# Patient Record
Sex: Female | Born: 1945
Health system: Southern US, Community
[De-identification: ages and names within clinical notes are randomized; demographics above are authoritative.]

## PROBLEM LIST (undated history)

## (undated) DIAGNOSIS — I10 Essential (primary) hypertension: Secondary | ICD-10-CM

## (undated) DIAGNOSIS — E7800 Pure hypercholesterolemia, unspecified: Secondary | ICD-10-CM

## (undated) DIAGNOSIS — J45909 Unspecified asthma, uncomplicated: Secondary | ICD-10-CM

## (undated) DIAGNOSIS — C801 Malignant (primary) neoplasm, unspecified: Secondary | ICD-10-CM

## (undated) DIAGNOSIS — K219 Gastro-esophageal reflux disease without esophagitis: Secondary | ICD-10-CM

## (undated) HISTORY — DX: Malignant (primary) neoplasm, unspecified: C80.1

---

## 2016-05-19 DIAGNOSIS — I10 Essential (primary) hypertension: Secondary | ICD-10-CM | POA: Diagnosis present

## 2016-05-19 DIAGNOSIS — E782 Mixed hyperlipidemia: Secondary | ICD-10-CM | POA: Diagnosis present

## 2018-10-05 DIAGNOSIS — B0229 Other postherpetic nervous system involvement: Secondary | ICD-10-CM | POA: Diagnosis present

## 2019-01-09 DIAGNOSIS — M3390 Dermatopolymyositis, unspecified, organ involvement unspecified: Secondary | ICD-10-CM | POA: Diagnosis present

## 2019-01-09 DIAGNOSIS — G8929 Other chronic pain: Secondary | ICD-10-CM | POA: Insufficient documentation

## 2019-01-09 DIAGNOSIS — M3313 Other dermatomyositis without myopathy: Secondary | ICD-10-CM | POA: Diagnosis present

## 2019-01-13 DIAGNOSIS — D849 Immunodeficiency, unspecified: Secondary | ICD-10-CM | POA: Insufficient documentation

## 2019-07-11 DIAGNOSIS — M51369 Other intervertebral disc degeneration, lumbar region without mention of lumbar back pain or lower extremity pain: Secondary | ICD-10-CM

## 2019-07-11 DIAGNOSIS — M5136 Other intervertebral disc degeneration, lumbar region: Secondary | ICD-10-CM

## 2020-05-04 ENCOUNTER — Encounter (HOSPITAL_COMMUNITY): Payer: Self-pay

## 2020-05-04 ENCOUNTER — Other Ambulatory Visit: Payer: Self-pay

## 2020-05-04 ENCOUNTER — Inpatient Hospital Stay (HOSPITAL_COMMUNITY)
Admit: 2020-05-04 | Discharge: 2020-05-08 | DRG: 758 | Disposition: A | Discharge status: Home Health | Payer: Medicare PPO | Attending: Internal Medicine | Admitting: Internal Medicine

## 2020-05-04 ENCOUNTER — Emergency Department (HOSPITAL_COMMUNITY): Payer: Medicare PPO

## 2020-05-04 DIAGNOSIS — K760 Fatty (change of) liver, not elsewhere classified: Secondary | ICD-10-CM | POA: Diagnosis present

## 2020-05-04 DIAGNOSIS — Z79899 Other long term (current) drug therapy: Secondary | ICD-10-CM

## 2020-05-04 DIAGNOSIS — Z791 Long term (current) use of non-steroidal anti-inflammatories (NSAID): Secondary | ICD-10-CM

## 2020-05-04 DIAGNOSIS — K219 Gastro-esophageal reflux disease without esophagitis: Secondary | ICD-10-CM | POA: Diagnosis present

## 2020-05-04 DIAGNOSIS — G8929 Other chronic pain: Secondary | ICD-10-CM | POA: Diagnosis present

## 2020-05-04 DIAGNOSIS — E782 Mixed hyperlipidemia: Secondary | ICD-10-CM | POA: Diagnosis present

## 2020-05-04 DIAGNOSIS — M5441 Lumbago with sciatica, right side: Secondary | ICD-10-CM | POA: Diagnosis present

## 2020-05-04 DIAGNOSIS — I890 Lymphedema, not elsewhere classified: Secondary | ICD-10-CM | POA: Diagnosis present

## 2020-05-04 DIAGNOSIS — E872 Acidosis, unspecified: Secondary | ICD-10-CM | POA: Diagnosis present

## 2020-05-04 DIAGNOSIS — Z888 Allergy status to other drugs, medicaments and biological substances status: Secondary | ICD-10-CM

## 2020-05-04 DIAGNOSIS — I110 Hypertensive heart disease with heart failure: Secondary | ICD-10-CM | POA: Diagnosis present

## 2020-05-04 DIAGNOSIS — E8809 Other disorders of plasma-protein metabolism, not elsewhere classified: Secondary | ICD-10-CM | POA: Diagnosis present

## 2020-05-04 DIAGNOSIS — J45909 Unspecified asthma, uncomplicated: Secondary | ICD-10-CM | POA: Diagnosis present

## 2020-05-04 DIAGNOSIS — M3313 Other dermatomyositis without myopathy: Secondary | ICD-10-CM | POA: Diagnosis present

## 2020-05-04 DIAGNOSIS — Z7952 Long term (current) use of systemic steroids: Secondary | ICD-10-CM

## 2020-05-04 DIAGNOSIS — E119 Type 2 diabetes mellitus without complications: Secondary | ICD-10-CM | POA: Diagnosis present

## 2020-05-04 DIAGNOSIS — I10 Essential (primary) hypertension: Secondary | ICD-10-CM | POA: Diagnosis present

## 2020-05-04 DIAGNOSIS — N739 Female pelvic inflammatory disease, unspecified: Principal | ICD-10-CM | POA: Diagnosis present

## 2020-05-04 DIAGNOSIS — L8990 Pressure ulcer of unspecified site, unspecified stage: Secondary | ICD-10-CM | POA: Insufficient documentation

## 2020-05-04 DIAGNOSIS — R600 Localized edema: Secondary | ICD-10-CM | POA: Diagnosis present

## 2020-05-04 DIAGNOSIS — R809 Proteinuria, unspecified: Secondary | ICD-10-CM | POA: Diagnosis present

## 2020-05-04 DIAGNOSIS — M3390 Dermatopolymyositis, unspecified, organ involvement unspecified: Secondary | ICD-10-CM | POA: Diagnosis present

## 2020-05-04 DIAGNOSIS — B0229 Other postherpetic nervous system involvement: Secondary | ICD-10-CM | POA: Diagnosis present

## 2020-05-04 DIAGNOSIS — I2510 Atherosclerotic heart disease of native coronary artery without angina pectoris: Secondary | ICD-10-CM | POA: Diagnosis present

## 2020-05-04 DIAGNOSIS — M7989 Other specified soft tissue disorders: Secondary | ICD-10-CM | POA: Diagnosis not present

## 2020-05-04 DIAGNOSIS — R197 Diarrhea, unspecified: Secondary | ICD-10-CM | POA: Diagnosis present

## 2020-05-04 DIAGNOSIS — Z20822 Contact with and (suspected) exposure to covid-19: Secondary | ICD-10-CM | POA: Diagnosis present

## 2020-05-04 DIAGNOSIS — L0291 Cutaneous abscess, unspecified: Secondary | ICD-10-CM

## 2020-05-04 DIAGNOSIS — Z8619 Personal history of other infectious and parasitic diseases: Secondary | ICD-10-CM

## 2020-05-04 HISTORY — DX: Gastro-esophageal reflux disease without esophagitis: K21.9

## 2020-05-04 HISTORY — DX: Pure hypercholesterolemia, unspecified: E78.00

## 2020-05-04 HISTORY — DX: Unspecified asthma, uncomplicated: J45.909

## 2020-05-04 HISTORY — DX: Essential (primary) hypertension: I10

## 2020-05-04 LAB — CBC
HCT: 31.8 % — ABNORMAL LOW (ref 36.0–46.0)
Hemoglobin: 9.9 g/dL — ABNORMAL LOW (ref 12.0–15.0)
MCH: 30.7 pg (ref 26.0–34.0)
MCHC: 31.1 g/dL (ref 30.0–36.0)
MCV: 98.5 fL (ref 80.0–100.0)
Platelets: 144 10*3/uL — ABNORMAL LOW (ref 150–400)
RBC: 3.23 MIL/uL — ABNORMAL LOW (ref 3.87–5.11)
RDW: 15.3 % (ref 11.5–15.5)
WBC: 7.5 10*3/uL (ref 4.0–10.5)
nRBC: 1.6 % — ABNORMAL HIGH (ref 0.0–0.2)

## 2020-05-04 LAB — BASIC METABOLIC PANEL
Anion gap: 8 (ref 5–15)
BUN: 15 mg/dL (ref 8–23)
CO2: 25 mmol/L (ref 22–32)
Calcium: 8.7 mg/dL — ABNORMAL LOW (ref 8.9–10.3)
Chloride: 103 mmol/L (ref 98–111)
Creatinine, Ser: 1.08 mg/dL — ABNORMAL HIGH (ref 0.44–1.00)
GFR calc Af Amer: 59 mL/min — ABNORMAL LOW (ref >60)
GFR calc non Af Amer: 51 mL/min — ABNORMAL LOW (ref >60)
Glucose, Bld: 85 mg/dL (ref 70–99)
Potassium: 4.3 mmol/L (ref 3.5–5.1)
Sodium: 136 mmol/L (ref 135–145)

## 2020-05-04 NOTE — ED Notes (Signed)
Lab to add on BNP.  °

## 2020-05-04 NOTE — ED Notes (Signed)
EDP at bedside  

## 2020-05-04 NOTE — ED Provider Notes (Signed)
[facility] EMERGENCY DEPARTMENT Provider Note   CSN: [id number] Arrival date & time: 05/04/20  1807     History Chief Complaint  Patient presents with  . Leg Swelling    Haley Johnson is a 74 y.o. female.   Haley Johnson 74 y.o. female  with past medical history significant for essential hypertension, DDD of the spine, chronic low back pain, DM2, dermatomyositis, vasculitis, on chronic immunosuppressive therapy, chronic lymphedema presenting with worsening bilateral lower extremity edema for the past 3 days.  States she is had leg swelling in the past but never this severe.  She denies any difficulty breathing, chest pain, shortness of breath, cough or fever.  She denies any history of heart failure.  She denies any abdominal pain, nausea, vomiting.  States she came in today because she has progressively worsening swelling in her legs making it difficult to walk.  Chart review shows patient was recently hospitalized.  She was not forthcoming with this information.  She was hospitalized at [facility] from June 3 to June 7 with low back pain.  She was found to have a pelvic abscess near her rectum on CT scan.  IR drainage was attempted but not successful.  She was discharged on p.o. Bactrim and augmentin which she is currently taking.  She is not sure exactly what was done for her in the hospital.  IR drainage is not successful.  She denies any history of diabetes. She also had an MRI of her thoracic and lumbar spine was negative for osteomyelitis or epidural abscess.  The history is provided by the patient.       Past Medical History:  Diagnosis Date  . Asthma   . GERD (gastroesophageal reflux disease)   . Hypercholesteremia   . Hypertension     There are no problems to display for this patient.   History reviewed. No pertinent surgical history.   OB History   No obstetric history on file.     No family history on file.  Social History    Tobacco Use  . Smoking status: Not on file  Substance Use Topics  . Alcohol use: Not on file  . Drug use: Not on file    Home Medications Prior to Admission medications   Not on File    Allergies    Patient has no allergy information on record.  Review of Systems   Review of Systems  Constitutional: Positive for fatigue. Negative for fever.  HENT: Negative for congestion and dental problem.   Eyes: Negative for visual disturbance.  Respiratory: Negative for cough, chest tightness and shortness of breath.   Cardiovascular: Positive for leg swelling. Negative for chest pain.  Gastrointestinal: Negative for abdominal pain, nausea and vomiting.  Genitourinary: Negative for dysuria and hematuria.  Musculoskeletal: Positive for arthralgias and myalgias.  Skin: Negative for rash.  Neurological: Positive for weakness. Negative for light-headedness and headaches.   all other systems are negative except as noted in the HPI and PMH.    Physical Exam Updated Vital Signs BP (!) 157/82 (BP Location: Left Arm)   Pulse 84   Temp 99.2 F (37.3 C) (Oral)   Resp 20   Ht 5\' 2"  (1.575 m)   Wt 77.6 kg   SpO2 100%   BMI 31.28 kg/m   Physical Exam Vitals and nursing note reviewed.  Constitutional:      General: She is not in acute distress.    Appearance: She is well-developed.  HENT:     Head: Normocephalic and atraumatic.     Comments: Hyperpigmentation of face with chronic swelling of her eyes and cheeks which she says is improved from previous    Mouth/Throat:     Pharynx: No oropharyngeal exudate.  Eyes:     Conjunctiva/sclera: Conjunctivae normal.     Pupils: Pupils are equal, round, and reactive to light.  Neck:     Comments: No meningismus. Cardiovascular:     Rate and Rhythm: Normal rate and regular rhythm.     Heart sounds: Normal heart sounds. No murmur heard.   Pulmonary:     Effort: Pulmonary effort is normal. No respiratory distress.     Breath sounds: Normal  breath sounds.  Abdominal:     Palpations: Abdomen is soft.     Tenderness: There is no abdominal tenderness. There is no guarding or rebound.  Musculoskeletal:        General: No tenderness. Normal range of motion.     Cervical back: Normal range of motion and neck supple.     Right lower leg: Edema present.     Left lower leg: Edema present.     Comments: Pitting edema to mid thighs bilaterally.  Intact DP pulses with Doppler.  Skin:    General: Skin is warm.  Neurological:     Mental Status: She is alert and oriented to person, place, and time.     Cranial Nerves: No cranial nerve deficit.     Motor: No abnormal muscle tone.     Coordination: Coordination normal.     Comments:  5/5 strength throughout. CN 2-12 intact.Equal grip strength.   Psychiatric:        Behavior: Behavior normal.     ED Results / Procedures / Treatments   Labs (all labs ordered are listed, but only abnormal results are displayed) Labs Reviewed  CBC - Abnormal; Notable for the following components:      Result Value   RBC 3.23 (*)    Hemoglobin 9.9 (*)    HCT 31.8 (*)    Platelets 144 (*)    nRBC 1.6 (*)    All other components within normal limits  BASIC METABOLIC PANEL - Abnormal; Notable for the following components:   Creatinine, Ser 1.08 (*)    Calcium 8.7 (*)    GFR calc non Af Amer 51 (*)    GFR calc Af Amer 59 (*)    All other components within normal limits  SEDIMENTATION RATE - Abnormal; Notable for the following components:   Sed Rate 23 (*)    All other components within normal limits  C-REACTIVE PROTEIN - Abnormal; Notable for the following components:   CRP 4.0 (*)    All other components within normal limits  HEPATIC FUNCTION PANEL - Abnormal; Notable for the following components:   Total Protein 6.1 (*)    Albumin 2.6 (*)    AST 78 (*)    Alkaline Phosphatase 36 (*)    All other components within normal limits  LACTIC ACID, PLASMA - Abnormal; Notable for the following  components:   Lactic Acid, Venous 3.1 (*)    All other components within normal limits  HEMOGLOBIN A1C - Abnormal; Notable for the following components:   Hgb A1c MFr Bld 6.2 (*)    All other components within normal limits  CULTURE, BLOOD (ROUTINE X 2)  CULTURE, BLOOD (ROUTINE X 2)  SARS CORONAVIRUS 2 BY RT PCR (HOSPITAL ORDER, [facility])  BRAIN NATRIURETIC PEPTIDE  TSH  T4, FREE  LIPASE, BLOOD  PROTIME-INR  APTT  LACTIC ACID, PLASMA  URINALYSIS, COMPLETE (UACMP) WITH MICROSCOPIC  PROTEIN / CREATININE RATIO, URINE    EKG None  Radiology CT Chest W Contrast  Result Date: 05/05/2020 CLINICAL DATA:  Bilateral lower extremity edema. EXAM: CT CHEST, ABDOMEN, AND PELVIS WITH CONTRAST TECHNIQUE: Multidetector CT imaging of the chest, abdomen and pelvis was performed following the standard protocol during bolus administration of intravenous contrast. CONTRAST:  177mL OMNIPAQUE IOHEXOL 300 MG/ML  SOLN COMPARISON:  None. FINDINGS: CT CHEST FINDINGS Cardiovascular: Scattered coronary artery calcifications. Heart is normal size. Aorta is normal caliber. Retroesophageal right subclavian artery. Mediastinum/Nodes: No mediastinal, hilar, or axillary adenopathy. Trachea and esophagus are unremarkable. Lungs/Pleura: Lungs are clear. No focal airspace opacities or suspicious nodules. No effusions. Musculoskeletal: Chest wall soft tissues are unremarkable. No acute bony abnormality. CT ABDOMEN PELVIS FINDINGS Hepatobiliary: Diffuse fatty infiltration of the liver. No suspicious focal hepatic abnormality. Small cyst in the right hepatic lobe. Gallbladder unremarkable. Pancreas: No focal abnormality or ductal dilatation. Spleen: No focal abnormality.  Normal size. Adrenals/Urinary Tract: No adrenal abnormality. No focal renal abnormality. No stones or hydronephrosis. Urinary bladder is unremarkable. Stomach/Bowel: Stomach, large and small bowel grossly unremarkable.  Vascular/Lymphatic: Aortic atherosclerosis. No enlarged abdominal or pelvic lymph nodes. Reproductive: Uterus is unremarkable. There is a 3.1 cm fluid collection seen in the cul-de-sac posterior to the uterus with enhancing rim. It is unclear if this is ovarian in origin or could represent a focal pelvic abscess. Other: Small amount of free fluid in the pelvis.  No free air. Musculoskeletal: No acute bony abnormality. IMPRESSION: Scattered coronary artery calcifications. Diffuse fatty infiltration of the liver. 3.1 cm fluid collection in the cul-de-sac between the uterus and sigmoid colon. This could be ovarian in origin or could reflect a pelvic abscess. Consider further evaluation with pelvic ultrasound. Electronically Signed   By: Rolm Baptise M.D.   On: 05/05/2020 01:24   US Pelvis Complete  Result Date: 05/05/2020 CLINICAL DATA:  Pelvic fluid collection. EXAM: TRANSABDOMINAL ULTRASOUND OF PELVIS TECHNIQUE: Transabdominal ultrasound examination of the pelvis was performed including evaluation of the uterus, ovaries, adnexal regions, and pelvic cul-de-sac. COMPARISON:  CT from same day. FINDINGS: Uterus Measurements: 8 x 3.4 x 4.5 cm = volume: 63 mL. No fibroids or other mass visualized. Endometrium: The endometrium was poorly evaluated. Right ovary Not visualized Left ovary Not visualized Other findings:  No abnormal free fluid. IMPRESSION: 1. Very limited study. 2. Neither ovary was visualized. 3. The collection in the patient's pelvis identified on the prior CT was not visualized on this study. 4. Suboptimal evaluation of the endometrium on this transabdominal only examination. Electronically Signed   By: Constance Holster M.D.   On: 05/05/2020 02:56   CT ABDOMEN PELVIS W CONTRAST  Result Date: 05/05/2020 CLINICAL DATA:  Bilateral lower extremity edema. EXAM: CT CHEST, ABDOMEN, AND PELVIS WITH CONTRAST TECHNIQUE: Multidetector CT imaging of the chest, abdomen and pelvis was performed following the  standard protocol during bolus administration of intravenous contrast. CONTRAST:  140mL OMNIPAQUE IOHEXOL 300 MG/ML  SOLN COMPARISON:  None. FINDINGS: CT CHEST FINDINGS Cardiovascular: Scattered coronary artery calcifications. Heart is normal size. Aorta is normal caliber. Retroesophageal right subclavian artery. Mediastinum/Nodes: No mediastinal, hilar, or axillary adenopathy. Trachea and esophagus are unremarkable. Lungs/Pleura: Lungs are clear. No focal airspace opacities or suspicious nodules. No effusions. Musculoskeletal: Chest wall soft tissues are unremarkable. No acute bony abnormality. CT  ABDOMEN PELVIS FINDINGS Hepatobiliary: Diffuse fatty infiltration of the liver. No suspicious focal hepatic abnormality. Small cyst in the right hepatic lobe. Gallbladder unremarkable. Pancreas: No focal abnormality or ductal dilatation. Spleen: No focal abnormality.  Normal size. Adrenals/Urinary Tract: No adrenal abnormality. No focal renal abnormality. No stones or hydronephrosis. Urinary bladder is unremarkable. Stomach/Bowel: Stomach, large and small bowel grossly unremarkable. Vascular/Lymphatic: Aortic atherosclerosis. No enlarged abdominal or pelvic lymph nodes. Reproductive: Uterus is unremarkable. There is a 3.1 cm fluid collection seen in the cul-de-sac posterior to the uterus with enhancing rim. It is unclear if this is ovarian in origin or could represent a focal pelvic abscess. Other: Small amount of free fluid in the pelvis.  No free air. Musculoskeletal: No acute bony abnormality. IMPRESSION: Scattered coronary artery calcifications. Diffuse fatty infiltration of the liver. 3.1 cm fluid collection in the cul-de-sac between the uterus and sigmoid colon. This could be ovarian in origin or could reflect a pelvic abscess. Consider further evaluation with pelvic ultrasound. Electronically Signed   By: Rolm Baptise M.D.   On: 05/05/2020 01:24   DG Chest Portable 1 View  Result Date: 05/05/2020 CLINICAL DATA:   Shortness of breath. EXAM: PORTABLE CHEST 1 VIEW COMPARISON:  None. FINDINGS: The heart size is borderline enlarged. There is no pneumothorax or large pleural effusion. No focal infiltrate. There is a small density in the left perihilar region measuring approximately 7 mm and projecting over the posterior seventh rib on the left. IMPRESSION: 1. No acute cardiopulmonary process. 2. Small 7 mm density in the left perihilar region. A 4-6 week follow-up two-view chest x-ray is recommended to confirm resolution of this finding. Electronically Signed   By: Constance Holster M.D.   On: 05/05/2020 00:15    Procedures Procedures (including critical care time)  Medications Ordered in ED Medications - No data to display  ED Course  I have reviewed the triage vital signs and the nursing notes.  Pertinent labs & imaging results that were available during my care of the patient were reviewed by me and considered in my medical decision making (see chart for details).    MDM Rules/Calculators/A&P                         Worsening chronic leg swelling over the past several days.  Recent hospitalization for pelvic abscess currently on antibiotics with unsuccessful IR drainage.  Here with increased leg swelling but denies any shortness of breath, cough or fever.  Hemoglobin of 9.9 is stable to previous values in care everywhere. Creatinine is stable.  CT today shows recurrent abscess between the ovary and sigmoid colon. Ultrasound was performed but did not identify this fluid collection.  Patient was discharged on Augmentin which seems not first-line choice for intra-abdominal abscess.  Will initiate IV antibiotics.  Her leg swelling is worsened than baseline to the point where she cannot ambulate.  This may be due to her low albumin versus worsening of her chronic edema. CXR without significant edema but does have elevated JVD.  Lactic acid mildly elevated without other signs of sepsis or systemic  infection.  D/w hospitalist Dr. Cyd Silence who agrees with IV anitbiotics and trial of lasix despite elevated lactate. Final Clinical Impression(s) / ED Diagnoses Final diagnoses:  Leg edema  Pelvic abscess in female    Rx / DC Orders ED Discharge Orders    None       Jhania Etherington, Annie Main, MD 05/05/20 [phone]

## 2020-05-04 NOTE — ED Triage Notes (Signed)
Pt arrives to ED w/ c/o BLE edema x 1 week. Pt denies SOB, denies CHF hx.

## 2020-05-05 ENCOUNTER — Observation Stay (HOSPITAL_BASED_OUTPATIENT_CLINIC_OR_DEPARTMENT_OTHER): Payer: Medicare PPO

## 2020-05-05 ENCOUNTER — Emergency Department (HOSPITAL_COMMUNITY): Payer: Medicare PPO

## 2020-05-05 ENCOUNTER — Other Ambulatory Visit: Payer: Self-pay

## 2020-05-05 ENCOUNTER — Encounter (HOSPITAL_COMMUNITY): Payer: Self-pay | Admitting: Internal Medicine

## 2020-05-05 DIAGNOSIS — R197 Diarrhea, unspecified: Secondary | ICD-10-CM | POA: Diagnosis present

## 2020-05-05 DIAGNOSIS — K219 Gastro-esophageal reflux disease without esophagitis: Secondary | ICD-10-CM | POA: Diagnosis present

## 2020-05-05 DIAGNOSIS — M3313 Other dermatomyositis without myopathy: Secondary | ICD-10-CM | POA: Diagnosis present

## 2020-05-05 DIAGNOSIS — M5441 Lumbago with sciatica, right side: Secondary | ICD-10-CM | POA: Diagnosis present

## 2020-05-05 DIAGNOSIS — B0229 Other postherpetic nervous system involvement: Secondary | ICD-10-CM | POA: Diagnosis present

## 2020-05-05 DIAGNOSIS — K760 Fatty (change of) liver, not elsewhere classified: Secondary | ICD-10-CM | POA: Diagnosis present

## 2020-05-05 DIAGNOSIS — J45909 Unspecified asthma, uncomplicated: Secondary | ICD-10-CM | POA: Diagnosis present

## 2020-05-05 DIAGNOSIS — E119 Type 2 diabetes mellitus without complications: Secondary | ICD-10-CM | POA: Diagnosis present

## 2020-05-05 DIAGNOSIS — N739 Female pelvic inflammatory disease, unspecified: Secondary | ICD-10-CM | POA: Diagnosis present

## 2020-05-05 DIAGNOSIS — Z791 Long term (current) use of non-steroidal anti-inflammatories (NSAID): Secondary | ICD-10-CM | POA: Diagnosis not present

## 2020-05-05 DIAGNOSIS — R609 Edema, unspecified: Secondary | ICD-10-CM

## 2020-05-05 DIAGNOSIS — I2510 Atherosclerotic heart disease of native coronary artery without angina pectoris: Secondary | ICD-10-CM | POA: Diagnosis present

## 2020-05-05 DIAGNOSIS — E46 Unspecified protein-calorie malnutrition: Secondary | ICD-10-CM | POA: Diagnosis present

## 2020-05-05 DIAGNOSIS — Z79899 Other long term (current) drug therapy: Secondary | ICD-10-CM | POA: Diagnosis not present

## 2020-05-05 DIAGNOSIS — E872 Acidosis, unspecified: Secondary | ICD-10-CM | POA: Diagnosis present

## 2020-05-05 DIAGNOSIS — Z888 Allergy status to other drugs, medicaments and biological substances status: Secondary | ICD-10-CM | POA: Diagnosis not present

## 2020-05-05 DIAGNOSIS — Z20822 Contact with and (suspected) exposure to covid-19: Secondary | ICD-10-CM | POA: Diagnosis present

## 2020-05-05 DIAGNOSIS — I890 Lymphedema, not elsewhere classified: Secondary | ICD-10-CM | POA: Diagnosis present

## 2020-05-05 DIAGNOSIS — G8929 Other chronic pain: Secondary | ICD-10-CM | POA: Diagnosis present

## 2020-05-05 DIAGNOSIS — E782 Mixed hyperlipidemia: Secondary | ICD-10-CM | POA: Diagnosis present

## 2020-05-05 DIAGNOSIS — M7989 Other specified soft tissue disorders: Secondary | ICD-10-CM | POA: Diagnosis present

## 2020-05-05 DIAGNOSIS — I10 Essential (primary) hypertension: Secondary | ICD-10-CM

## 2020-05-05 DIAGNOSIS — I5023 Acute on chronic systolic (congestive) heart failure: Secondary | ICD-10-CM | POA: Diagnosis not present

## 2020-05-05 DIAGNOSIS — I110 Hypertensive heart disease with heart failure: Secondary | ICD-10-CM | POA: Diagnosis present

## 2020-05-05 DIAGNOSIS — R809 Proteinuria, unspecified: Secondary | ICD-10-CM | POA: Diagnosis present

## 2020-05-05 DIAGNOSIS — Z8619 Personal history of other infectious and parasitic diseases: Secondary | ICD-10-CM | POA: Diagnosis not present

## 2020-05-05 DIAGNOSIS — M3390 Dermatopolymyositis, unspecified, organ involvement unspecified: Secondary | ICD-10-CM

## 2020-05-05 DIAGNOSIS — R600 Localized edema: Secondary | ICD-10-CM | POA: Diagnosis present

## 2020-05-05 DIAGNOSIS — E8809 Other disorders of plasma-protein metabolism, not elsewhere classified: Secondary | ICD-10-CM | POA: Diagnosis present

## 2020-05-05 DIAGNOSIS — Z7952 Long term (current) use of systemic steroids: Secondary | ICD-10-CM | POA: Diagnosis not present

## 2020-05-05 HISTORY — DX: Dermatopolymyositis, unspecified, organ involvement unspecified: M33.90

## 2020-05-05 HISTORY — DX: Gastro-esophageal reflux disease without esophagitis: K21.9

## 2020-05-05 HISTORY — DX: Essential (primary) hypertension: I10

## 2020-05-05 HISTORY — DX: Other dermatomyositis without myopathy: M33.13

## 2020-05-05 HISTORY — DX: Female pelvic inflammatory disease, unspecified: N73.9

## 2020-05-05 LAB — HEPATIC FUNCTION PANEL
ALT: 43 U/L (ref 0–44)
AST: 78 U/L — ABNORMAL HIGH (ref 15–41)
Albumin: 2.6 g/dL — ABNORMAL LOW (ref 3.5–5.0)
Alkaline Phosphatase: 36 U/L — ABNORMAL LOW (ref 38–126)
Bilirubin, Direct: 0.2 mg/dL (ref 0.0–0.2)
Indirect Bilirubin: 0.7 mg/dL (ref 0.3–0.9)
Total Bilirubin: 0.9 mg/dL (ref 0.3–1.2)
Total Protein: 6.1 g/dL — ABNORMAL LOW (ref 6.5–8.1)

## 2020-05-05 LAB — PROTIME-INR
INR: 1.2 (ref 0.8–1.2)
Prothrombin Time: 14.7 seconds (ref 11.4–15.2)

## 2020-05-05 LAB — HEMOGLOBIN A1C
Hgb A1c MFr Bld: 6.2 % — ABNORMAL HIGH (ref 4.8–5.6)
Mean Plasma Glucose: 131.24 mg/dL

## 2020-05-05 LAB — LIPASE, BLOOD: Lipase: 20 U/L (ref 11–51)

## 2020-05-05 LAB — C-REACTIVE PROTEIN: CRP: 4 mg/dL — ABNORMAL HIGH (ref <1.0)

## 2020-05-05 LAB — SEDIMENTATION RATE: Sed Rate: 23 mm/hr — ABNORMAL HIGH (ref 0–22)

## 2020-05-05 LAB — APTT: aPTT: 35 seconds (ref 24–36)

## 2020-05-05 LAB — LACTIC ACID, PLASMA
Lactic Acid, Venous: 3.1 mmol/L (ref 0.5–1.9)
Lactic Acid, Venous: 1.9 mmol/L (ref 0.5–1.9)

## 2020-05-05 LAB — SARS CORONAVIRUS 2 BY RT PCR (HOSPITAL ORDER, PERFORMED IN ~~LOC~~ HOSPITAL LAB): SARS Coronavirus 2: NEGATIVE

## 2020-05-05 LAB — T4, FREE: Free T4: 1.06 ng/dL (ref 0.61–1.12)

## 2020-05-05 LAB — BRAIN NATRIURETIC PEPTIDE: B Natriuretic Peptide: 48.2 pg/mL (ref 0.0–100.0)

## 2020-05-05 LAB — TSH: TSH: 0.556 u[IU]/mL (ref 0.350–4.500)

## 2020-05-05 MED ORDER — FAMOTIDINE 20 MG PO TABS
20.0000 mg | ORAL_TABLET | Freq: Every day | ORAL | Status: DC
  Administered 2020-05-06 – 2020-05-08 (×3): 20 mg via ORAL
  Filled 2020-05-05 (×3): qty 1

## 2020-05-05 MED ORDER — METRONIDAZOLE IN NACL 5-0.79 MG/ML-% IV SOLN
500.0000 mg | Freq: Three times a day (TID) | INTRAVENOUS | Status: AC
  Administered 2020-05-05 – 2020-05-07 (×9): 500 mg via INTRAVENOUS
  Filled 2020-05-05 (×9): qty 100

## 2020-05-05 MED ORDER — FUROSEMIDE 10 MG/ML IJ SOLN
20.0000 mg | Freq: Once | INTRAMUSCULAR | Status: AC
  Administered 2020-05-05: 20 mg via INTRAVENOUS
  Filled 2020-05-05: qty 2

## 2020-05-05 MED ORDER — ALBUTEROL SULFATE (2.5 MG/3ML) 0.083% IN NEBU: 2.5000 mg | INHALATION_SOLUTION | RESPIRATORY_TRACT | Status: DC | PRN

## 2020-05-05 MED ORDER — ENSURE ENLIVE PO LIQD
237.0000 mL | Freq: Two times a day (BID) | ORAL | Status: DC
  Administered 2020-05-05 – 2020-05-06 (×3): 237 mL via ORAL
  Filled 2020-05-05 (×3): qty 237

## 2020-05-05 MED ORDER — ACETAMINOPHEN 325 MG PO TABS
650.0000 mg | ORAL_TABLET | Freq: Four times a day (QID) | ORAL | Status: DC | PRN
  Administered 2020-05-06: 650 mg via ORAL
  Filled 2020-05-05: qty 2

## 2020-05-05 MED ORDER — IOHEXOL 300 MG/ML  SOLN
100.0000 mL | Freq: Once | INTRAMUSCULAR | Status: AC | PRN
  Administered 2020-05-05: 100 mL via INTRAVENOUS

## 2020-05-05 MED ORDER — FAMOTIDINE 20 MG PO TABS
40.0000 mg | ORAL_TABLET | Freq: Every day | ORAL | Status: DC
  Administered 2020-05-05: 40 mg via ORAL
  Filled 2020-05-05: qty 2

## 2020-05-05 MED ORDER — GABAPENTIN 100 MG PO CAPS
200.0000 mg | ORAL_CAPSULE | Freq: Every day | ORAL | Status: DC
  Administered 2020-05-05 – 2020-05-06 (×2): 200 mg via ORAL
  Filled 2020-05-05 (×2): qty 2

## 2020-05-05 MED ORDER — ROSUVASTATIN CALCIUM 20 MG PO TABS
40.0000 mg | ORAL_TABLET | Freq: Every day | ORAL | Status: DC
  Administered 2020-05-05 – 2020-05-08 (×4): 40 mg via ORAL
  Filled 2020-05-05 (×4): qty 2

## 2020-05-05 MED ORDER — SODIUM CHLORIDE 0.9 % IV BOLUS: 1000.0000 mL | Freq: Once | INTRAVENOUS | Status: DC

## 2020-05-05 MED ORDER — POLYETHYLENE GLYCOL 3350 17 G PO PACK: 17.0000 g | PACK | Freq: Every day | ORAL | Status: DC | PRN

## 2020-05-05 MED ORDER — ONDANSETRON HCL 4 MG/2ML IJ SOLN: 4.0000 mg | Freq: Four times a day (QID) | INTRAMUSCULAR | Status: DC | PRN

## 2020-05-05 MED ORDER — ENOXAPARIN SODIUM 40 MG/0.4ML ~~LOC~~ SOLN
40.0000 mg | SUBCUTANEOUS | Status: DC
  Administered 2020-05-05 – 2020-05-08 (×4): 40 mg via SUBCUTANEOUS
  Filled 2020-05-05 (×5): qty 0.4

## 2020-05-05 MED ORDER — PREDNISONE 10 MG PO TABS
10.0000 mg | ORAL_TABLET | Freq: Every day | ORAL | Status: DC
  Administered 2020-05-05 – 2020-05-08 (×4): 10 mg via ORAL
  Filled 2020-05-05 (×4): qty 1

## 2020-05-05 MED ORDER — MYCOPHENOLATE MOFETIL 250 MG PO CAPS
1000.0000 mg | ORAL_CAPSULE | Freq: Two times a day (BID) | ORAL | Status: DC
  Administered 2020-05-05 – 2020-05-08 (×6): 1000 mg via ORAL
  Filled 2020-05-05 (×9): qty 4

## 2020-05-05 MED ORDER — OXYCODONE-ACETAMINOPHEN 5-325 MG PO TABS
1.0000 | ORAL_TABLET | ORAL | Status: DC | PRN
  Administered 2020-05-05 – 2020-05-07 (×7): 1 via ORAL
  Filled 2020-05-05 (×7): qty 1

## 2020-05-05 MED ORDER — ONDANSETRON HCL 4 MG PO TABS: 4.0000 mg | ORAL_TABLET | Freq: Four times a day (QID) | ORAL | Status: DC | PRN

## 2020-05-05 MED ORDER — AMLODIPINE BESYLATE 5 MG PO TABS
5.0000 mg | ORAL_TABLET | Freq: Every day | ORAL | Status: DC
  Administered 2020-05-06 – 2020-05-08 (×3): 5 mg via ORAL
  Filled 2020-05-05 (×3): qty 1

## 2020-05-05 MED ORDER — SODIUM CHLORIDE 0.9 % IV SOLN
2.0000 g | Freq: Two times a day (BID) | INTRAVENOUS | Status: AC
  Administered 2020-05-05 – 2020-05-07 (×6): 2 g via INTRAVENOUS
  Filled 2020-05-05 (×7): qty 2

## 2020-05-05 MED ORDER — ACETAMINOPHEN 650 MG RE SUPP: 650.0000 mg | Freq: Four times a day (QID) | RECTAL | Status: DC | PRN

## 2020-05-05 MED ORDER — BISOPROLOL-HYDROCHLOROTHIAZIDE 2.5-6.25 MG PO TABS
1.0000 | ORAL_TABLET | Freq: Every day | ORAL | Status: DC
  Administered 2020-05-05 – 2020-05-08 (×4): 1 via ORAL
  Filled 2020-05-05 (×4): qty 1

## 2020-05-05 MED ORDER — ALBUTEROL SULFATE HFA 108 (90 BASE) MCG/ACT IN AERS
1.0000 | INHALATION_SPRAY | RESPIRATORY_TRACT | Status: DC | PRN
  Filled 2020-05-05: qty 6.7

## 2020-05-05 NOTE — Progress Notes (Signed)
Pharmacy Antibiotic Note  Haley Johnson is a 74 y.o. female admitted on 05/04/2020 with pelvic abscess.  Pharmacy has been consulted for cefepime dosing.  Plan: Cefepime 2gm IV q12 hours F/u renal function, cultures and clinical course  Height: 5\' 2"  (157.5 cm) Weight: 77.6 kg (171 lb) IBW/kg (Calculated) : 50.1  Temp (24hrs), Avg:99.2 F (37.3 C), Min:99.2 F (37.3 C), Max:99.2 F (37.3 C)  Recent Labs  Lab 05/04/20 1825 05/05/20 0045  WBC 7.5  --   CREATININE 1.08*  --   LATICACIDVEN  --  3.1*    Estimated Creatinine Clearance: 45.4 mL/min (A) (by C-G formula based on SCr of 1.08 mg/dL (H)).    No Known Allergies   Thank you for allowing pharmacy to be a part of this patient's care.  Excell Seltzer Poteet 05/05/2020 4:12 AM

## 2020-05-05 NOTE — Progress Notes (Signed)
VASCULAR LAB PRELIMINARY  PRELIMINARY  PRELIMINARY  PRELIMINARY  Bilateral lower extremity venous duplex completed.    Preliminary report:  See CV proc for preliminary results.   Marycatherine Maniscalco, RVT 05/05/2020, 8:49 AM

## 2020-05-05 NOTE — Progress Notes (Signed)
AdmitPatient ID: Haley Johnson, female   DOB: Apr 27, 1946, 74 y.o.   MRN: [id number] This is a no charge progress note as patient was . admitted this am.  H/P reviewed.  74 year old female with past medical history of hypertension, gastroesophageal reflux disease, hyperlipidemia, diabetes mellitus (patient disputes this), dermatomyositis, gastroesophageal reflux disease and lymphedema who presents to [facility] emergency department with complaints of bilateral lower extremity swelling and lower extremity weakness.Patient was additionally found to have a lactic acidosis of 3.  A/p Pelvic abscess Continue iv abx, may consider consult IR for daiange in am. F/u transvaginal pelvic US.-The collection in the patient's pelvis identified on the prior CT was not visualized on this study. B/l LE US-neg. For dvt

## 2020-05-05 NOTE — H&P (Signed)
History and Physical    Haley Johnson TDV:[id number] DOB: [birth year] DOA: 05/04/2020  PCP: Patient, No Pcp Per  Patient coming from: Home   Chief Complaint:  Chief Complaint  Patient presents with  . Leg Swelling     HPI:    74 year old female with past medical history of hypertension, gastroesophageal reflux disease, hyperlipidemia, diabetes mellitus (patient disputes this), dermatomyositis, gastroesophageal reflux disease and lymphedema who presents to [facility] emergency department with complaints of bilateral lower extremity swelling and lower extremity weakness.  Of note, patient was recently discharged from [facility] in [place] after she was found to have a pelvic abscess after presenting with a several month history of progressive low back pain.  Discharged on a home-going regimen of oral Augmentin on 04/28/2020.  Patient explains that shortly after her discharge from the hospital she felt improved.  However, in the days that followed, patient explains that she developed progressively worsening bilateral lower extremity swelling.  The swelling became more and more severe and became associated with weakness of the bilateral lower extremities.  Over the span of time, patient also experienced several bouts of paroxysmal nocturnal dyspnea and developed cough productive of white frothy sputum.  Patient complains of associated worsening of her right-sided sciatica as her swelling worsened.  Upon further questioning, patient denies fevers, sick contacts, nausea, vomiting, changes in appetite.  Patient symptoms continue to worsen until she was brought into [facility] emergency department for evaluation by family members.  Upon evaluation in the emergency department, patient was found to have substantial bilateral lower extremity swelling.  Patient was additionally found to have a lactic acidosis of 3.  The hospitalist group was called to assess the patient  for admission the hospital.   Review of Systems: A 10-system review of systems has been performed and all systems are negative with the exception of what is listed in the HPI.    Past Medical History:  Diagnosis Date  . Asthma   . Dermatomyositis ([place]) 05/05/2020  . Essential hypertension 05/05/2020  . GERD (gastroesophageal reflux disease)   . GERD without esophagitis 05/05/2020  . Hypercholesteremia   . Hypertension     History reviewed. No pertinent surgical history.   reports that she has never smoked. She has never used smokeless tobacco. No history on file for alcohol use and drug use.  No Known Allergies  Family History  Family history unknown: Yes     Prior to Admission medications   Medication Sig Start Date End Date Taking? Authorizing Provider  albuterol (VENTOLIN HFA) 108 (90 Base) MCG/ACT inhaler Inhale 1-2 puffs into the lungs every 6 (six) hours as needed for wheezing or shortness of breath.   Yes [provider]  amLODipine (NORVASC) 5 MG tablet Take 5 mg by mouth daily. 03/19/20  Yes [provider]  amoxicillin-clavulanate (AUGMENTIN) 875-125 MG tablet Take 1 tablet by mouth 2 (two) times daily.  04/29/20  Yes [provider]  bisoprolol-hydrochlorothiazide (ZIAC) 2.5-6.25 MG tablet Take 1 tablet by mouth daily. 03/19/20  Yes [provider]  etodolac (LODINE) 400 MG tablet Take 400 mg by mouth 2 (two) times daily. 04/17/20  Yes [provider]  famotidine (PEPCID) 40 MG tablet Take 40 mg by mouth daily. 03/19/20  Yes [provider]  gabapentin (NEURONTIN) 100 MG capsule Take 200 mg by mouth at bedtime. 01/10/20  Yes [provider]  meloxicam (MOBIC) 15 MG tablet Take 15 mg by mouth daily. 03/12/20  Yes [provider]  predniSONE (DELTASONE) 10 MG tablet Take 10 mg by mouth daily. 04/18/20  Yes [provider]  rosuvastatin (CRESTOR) 40 MG tablet Take 40 mg by mouth daily. 03/19/20  Yes  [provider]    Physical Exam: Vitals:   05/04/20 1816 05/04/20 2047 05/04/20 2341  BP: 122/65 (!) 144/87 (!) 157/82  Pulse: 83 85 84  Resp: 17 18 20   Temp: 99.2 F (37.3 C)    TempSrc: Oral    SpO2: 100% 100% 100%  Weight: 77.6 kg    Height: 5\' 2"  (1.575 m)      Constitutional: Acute alert and oriented x3, no associated distress.  Skin: no rashes, no lesions, good skin turgor noted. Eyes: Pupils are equally reactive to light.  No evidence of scleral icterus or conjunctival pallor.  ENMT: Moist mucous membranes noted.  Posterior pharynx clear of any exudate or lesions.   Neck: normal, supple, no masses, no thyromegaly.  No evidence of jugular venous distension.   Respiratory: clear to auscultation bilaterally, no wheezing, no crackles. Normal respiratory effort. No accessory muscle use.  Cardiovascular: Regular rate and rhythm, no murmurs / rubs / gallops.  Extensive bilateral lower extremity pitting edema that tracks up through the thighs.. 2+ pedal pulses. No carotid bruits.  Chest:   Nontender without crepitus or deformity.   Back:   Nontender without crepitus or deformity. Abdomen: Abdomen is soft and nontender.  No evidence of intra-abdominal masses.  Positive bowel sounds noted in all quadrants.   Musculoskeletal: Significant pain with both passive and active range of motion of the right leg.  No joint deformity upper and lower extremities. Good ROM, no contractures. Normal muscle tone.  Neurologic: CN 2-12 grossly intact. Sensation intact, patient is moving for all 4 extremities spontaneously patient is following all commands.  Patient is responsive to verbal stimuli.   Psychiatric: Patient presents as a normal mood with appropriate affect.  Patient seems to possess insight as to theircurrent situation.     Labs on Admission: I have personally reviewed following labs and imaging studies -   CBC: Recent Labs  Lab 05/04/20 1825  WBC 7.5  HGB 9.9*  HCT 31.8*    MCV 98.5  PLT 962*   Basic Metabolic Panel: Recent Labs  Lab 05/04/20 1825  NA 136  K 4.3  CL 103  CO2 25  GLUCOSE 85  BUN 15  CREATININE 1.08*  CALCIUM 8.7*   GFR: Estimated Creatinine Clearance: 45.4 mL/min (A) (by C-G formula based on SCr of 1.08 mg/dL (H)). Liver Function Tests: Recent Labs  Lab 05/04/20 2346  AST 78*  ALT 43  ALKPHOS 36*  BILITOT 0.9  PROT 6.1*  ALBUMIN 2.6*   Recent Labs  Lab 05/04/20 2346  LIPASE 20   No results for input(s): AMMONIA in the last 168 hours. Coagulation Profile: No results for input(s): INR, PROTIME in the last 168 hours. Cardiac Enzymes: No results for input(s): CKTOTAL, CKMB, CKMBINDEX, TROPONINI in the last 168 hours. BNP (last 3 results) No results for input(s): PROBNP in the last 8760 hours. HbA1C: Recent Labs    05/05/20 0338  HGBA1C 6.2*   CBG: No results for input(s): GLUCAP in the last 168 hours. Lipid Profile: No results for input(s): CHOL, HDL, LDLCALC, TRIG, CHOLHDL, LDLDIRECT in the last 72 hours. Thyroid Function Tests: Recent Labs    05/04/20 2346  TSH 0.556  FREET4 1.06   Anemia Panel: No results for input(s): VITAMINB12, FOLATE, FERRITIN,  TIBC, IRON, RETICCTPCT in the last 72 hours. Urine analysis: No results found for: COLORURINE, APPEARANCEUR, LABSPEC, [place], GLUCOSEU, HGBUR, BILIRUBINUR, KETONESUR, PROTEINUR, UROBILINOGEN, NITRITE, LEUKOCYTESUR  Radiological Exams on Admission - Personally Reviewed: CT Chest W Contrast  Result Date: 05/05/2020 CLINICAL DATA:  Bilateral lower extremity edema. EXAM: CT CHEST, ABDOMEN, AND PELVIS WITH CONTRAST TECHNIQUE: Multidetector CT imaging of the chest, abdomen and pelvis was performed following the standard protocol during bolus administration of intravenous contrast. CONTRAST:  187mL OMNIPAQUE IOHEXOL 300 MG/ML  SOLN COMPARISON:  None. FINDINGS: CT CHEST FINDINGS Cardiovascular: Scattered coronary artery calcifications. Heart is normal size. Aorta is  normal caliber. Retroesophageal right subclavian artery. Mediastinum/Nodes: No mediastinal, hilar, or axillary adenopathy. Trachea and esophagus are unremarkable. Lungs/Pleura: Lungs are clear. No focal airspace opacities or suspicious nodules. No effusions. Musculoskeletal: Chest wall soft tissues are unremarkable. No acute bony abnormality. CT ABDOMEN PELVIS FINDINGS Hepatobiliary: Diffuse fatty infiltration of the liver. No suspicious focal hepatic abnormality. Small cyst in the right hepatic lobe. Gallbladder unremarkable. Pancreas: No focal abnormality or ductal dilatation. Spleen: No focal abnormality.  Normal size. Adrenals/Urinary Tract: No adrenal abnormality. No focal renal abnormality. No stones or hydronephrosis. Urinary bladder is unremarkable. Stomach/Bowel: Stomach, large and small bowel grossly unremarkable. Vascular/Lymphatic: Aortic atherosclerosis. No enlarged abdominal or pelvic lymph nodes. Reproductive: Uterus is unremarkable. There is a 3.1 cm fluid collection seen in the cul-de-sac posterior to the uterus with enhancing rim. It is unclear if this is ovarian in origin or could represent a focal pelvic abscess. Other: Small amount of free fluid in the pelvis.  No free air. Musculoskeletal: No acute bony abnormality. IMPRESSION: Scattered coronary artery calcifications. Diffuse fatty infiltration of the liver. 3.1 cm fluid collection in the cul-de-sac between the uterus and sigmoid colon. This could be ovarian in origin or could reflect a pelvic abscess. Consider further evaluation with pelvic ultrasound. Electronically Signed   By: Rolm Baptise M.D.   On: 05/05/2020 01:24   US Pelvis Complete  Result Date: 05/05/2020 CLINICAL DATA:  Pelvic fluid collection. EXAM: TRANSABDOMINAL ULTRASOUND OF PELVIS TECHNIQUE: Transabdominal ultrasound examination of the pelvis was performed including evaluation of the uterus, ovaries, adnexal regions, and pelvic cul-de-sac. COMPARISON:  CT from same day.  FINDINGS: Uterus Measurements: 8 x 3.4 x 4.5 cm = volume: 63 mL. No fibroids or other mass visualized. Endometrium: The endometrium was poorly evaluated. Right ovary Not visualized Left ovary Not visualized Other findings:  No abnormal free fluid. IMPRESSION: 1. Very limited study. 2. Neither ovary was visualized. 3. The collection in the patient's pelvis identified on the prior CT was not visualized on this study. 4. Suboptimal evaluation of the endometrium on this transabdominal only examination. Electronically Signed   By: Constance Holster M.D.   On: 05/05/2020 02:56   CT ABDOMEN PELVIS W CONTRAST  Result Date: 05/05/2020 CLINICAL DATA:  Bilateral lower extremity edema. EXAM: CT CHEST, ABDOMEN, AND PELVIS WITH CONTRAST TECHNIQUE: Multidetector CT imaging of the chest, abdomen and pelvis was performed following the standard protocol during bolus administration of intravenous contrast. CONTRAST:  13mL OMNIPAQUE IOHEXOL 300 MG/ML  SOLN COMPARISON:  None. FINDINGS: CT CHEST FINDINGS Cardiovascular: Scattered coronary artery calcifications. Heart is normal size. Aorta is normal caliber. Retroesophageal right subclavian artery. Mediastinum/Nodes: No mediastinal, hilar, or axillary adenopathy. Trachea and esophagus are unremarkable. Lungs/Pleura: Lungs are clear. No focal airspace opacities or suspicious nodules. No effusions. Musculoskeletal: Chest wall soft tissues are unremarkable. No acute bony abnormality. CT ABDOMEN PELVIS FINDINGS Hepatobiliary: Diffuse fatty infiltration  of the liver. No suspicious focal hepatic abnormality. Small cyst in the right hepatic lobe. Gallbladder unremarkable. Pancreas: No focal abnormality or ductal dilatation. Spleen: No focal abnormality.  Normal size. Adrenals/Urinary Tract: No adrenal abnormality. No focal renal abnormality. No stones or hydronephrosis. Urinary bladder is unremarkable. Stomach/Bowel: Stomach, large and small bowel grossly unremarkable. Vascular/Lymphatic:  Aortic atherosclerosis. No enlarged abdominal or pelvic lymph nodes. Reproductive: Uterus is unremarkable. There is a 3.1 cm fluid collection seen in the cul-de-sac posterior to the uterus with enhancing rim. It is unclear if this is ovarian in origin or could represent a focal pelvic abscess. Other: Small amount of free fluid in the pelvis.  No free air. Musculoskeletal: No acute bony abnormality. IMPRESSION: Scattered coronary artery calcifications. Diffuse fatty infiltration of the liver. 3.1 cm fluid collection in the cul-de-sac between the uterus and sigmoid colon. This could be ovarian in origin or could reflect a pelvic abscess. Consider further evaluation with pelvic ultrasound. Electronically Signed   By: Rolm Baptise M.D.   On: 05/05/2020 01:24   DG Chest Portable 1 View  Result Date: 05/05/2020 CLINICAL DATA:  Shortness of breath. EXAM: PORTABLE CHEST 1 VIEW COMPARISON:  None. FINDINGS: The heart size is borderline enlarged. There is no pneumothorax or large pleural effusion. No focal infiltrate. There is a small density in the left perihilar region measuring approximately 7 mm and projecting over the posterior seventh rib on the left. IMPRESSION: 1. No acute cardiopulmonary process. 2. Small 7 mm density in the left perihilar region. A 4-6 week follow-up two-view chest x-ray is recommended to confirm resolution of this finding. Electronically Signed   By: Constance Holster M.D.   On: 05/05/2020 00:15     Assessment/Plan Principal Problem:   Pelvic abscess in female  Recent hospitalization at [facility] in [facility] for pelvic abscess incidentally found on CT imaging of the abdomen and pelvis after the patient had presented with a several month history of progressively worsening back pain.  Interventional radiology at [facility] did attempt IR guided drainage of the abscess but was only able to get 1 cc of serosanguineous fluid out as the remainder  of the fluid collection was felt to be phlegmon.  After being treated with several days of intravenous antibiotic therapy, patient was discharged on oral Augmentin on 6/6.  Oral Augmentin is not considered first or second line therapy for this type of abscess.  I am concerned the patient is being ineffectively treated by this drug.  While the patient is hospitalized here, I have transitioned the patient to intravenous cefepime and metronidazole.  3 cm abscess has been redemonstrated on our CT imaging of the abdomen and pelvis performed in the emergency department.  If patient fails to clinically improve will consider reevaluating repeat IR guided drainage with our IR team.  Ordering transvaginal pelvic ultrasound.  To better evaluate fluid collection.  At time of discharge patient can likely be transitioned to oral Omnicef and metronidazole  Active Problems:   Bilateral lower extremity edema   Patient reports several day history of progressively worsening bilateral lower extremity edema, well beyond her baseline lymphedema.  Clinically, patient complains of paroxysmal nocturnal dyspnea with episodes of productive cough with frothy sputum.  Examination reveals extensive bilateral lower extremity pitting edema with markedly elevated jugular venous pulse at 30 degrees.  While acute congestive heart failure is possible due to recent hospitalization for sepsis and intravenous volume resuscitation - other possible etiologies of bilateral lower  extremity edema include protein calorie malnutrition or nephrotic range proteinuria leading to substantial hypoalbuminemia, decreased oncotic pressure and third spacing of the lower extremities.  BNP is normal although this may be falsely normal  Will attempt to provide patient with a trial of Lasix 20 mg IV despite having a mild lactic acidosis and will monitor for symptomatic improvement  Obtaining urinalysis with spot protein creatinine ratio to identify  quantity of proteinuria  Obtaining echocardiogram    Lactic acidosis   Lactic acidosis of 3.0  Clinically patient does not seem volume depleted however and rather seems volume overloaded  Continue to treat underlying infection/pelvic abscess with intravenous antibiotic therapy.  Will perform serial lactic acid levels to ensure downtrending and resolution    Essential hypertension   Continue home regimen of antihypertensive therapy    Dermatomyositis ([place])   Longstanding known history of dermatomyositis  Patient follows with [facility] rheumatology  Continue home regimen of prednisone 10 mg daily and CellCept 1000 mg twice daily.  Patient is immunocompromise due to this immunomodulating regimen    Hypoalbuminemia   Please see assessment and plan above  Obtaining urinalysis  Obtaining spot protein and creatinine ratio  CT abdomen pelvis reveals no evidence of hepatic cirrhosis  Providing patient with nutritional supplements and obtaining nutrition consultation    Mixed hyperlipidemia   Continue home regimen of statin therapy    GERD without esophagitis  Continue home regimen of H2 blocker    Code Status:  Full code Family Communication: deferred   Status is: Observation  The patient remains OBS appropriate and will d/c before 2 midnights.  Dispo: The patient is from: Home              Anticipated d/c is to: Home              Anticipated d/c date is: 2 days              Patient currently is not medically stable to d/c.        Vernelle Emerald MD [facility] Pager [phone]  If 7PM-7AM, please contact night-coverage [email or web address] Use universal [place] password for that web site. If you do not have the password, please call the hospital operator.  05/05/2020, 4:09 AM

## 2020-05-05 NOTE — Plan of Care (Signed)
  Problem: Education: Goal: Knowledge of General Education information will improve Description: Including pain rating scale, medication(s)/side effects and non-pharmacologic comfort measures Outcome: Progressing  Aeb pt verbalizes poc this evening Problem: Clinical Measurements: Goal: Ability to maintain clinical measurements within normal limits will improve Outcome: Progressing  Aeb no new s/s Problem: Activity: Goal: Risk for activity intolerance will decrease Outcome: Progressing  Aeb pt up to [facility] w assist

## 2020-05-05 NOTE — ED Notes (Signed)
Patient transported to CT via stretcher.

## 2020-05-05 NOTE — ED Notes (Signed)
Ordered Breakfast--Haley Johnson  

## 2020-05-06 ENCOUNTER — Inpatient Hospital Stay (HOSPITAL_COMMUNITY): Payer: Medicare PPO

## 2020-05-06 DIAGNOSIS — B0229 Other postherpetic nervous system involvement: Secondary | ICD-10-CM

## 2020-05-06 DIAGNOSIS — I5023 Acute on chronic systolic (congestive) heart failure: Secondary | ICD-10-CM

## 2020-05-06 LAB — COMPREHENSIVE METABOLIC PANEL
ALT: 36 U/L (ref 0–44)
AST: 70 U/L — ABNORMAL HIGH (ref 15–41)
Albumin: 2.3 g/dL — ABNORMAL LOW (ref 3.5–5.0)
Alkaline Phosphatase: 34 U/L — ABNORMAL LOW (ref 38–126)
Anion gap: 9 (ref 5–15)
BUN: 13 mg/dL (ref 8–23)
CO2: 25 mmol/L (ref 22–32)
Calcium: 8.4 mg/dL — ABNORMAL LOW (ref 8.9–10.3)
Chloride: 100 mmol/L (ref 98–111)
Creatinine, Ser: 0.86 mg/dL (ref 0.44–1.00)
GFR calc Af Amer: >60 mL/min (ref >60)
GFR calc non Af Amer: >60 mL/min (ref >60)
Glucose, Bld: 86 mg/dL (ref 70–99)
Potassium: 4 mmol/L (ref 3.5–5.1)
Sodium: 134 mmol/L — ABNORMAL LOW (ref 135–145)
Total Bilirubin: 0.8 mg/dL (ref 0.3–1.2)
Total Protein: 5.6 g/dL — ABNORMAL LOW (ref 6.5–8.1)

## 2020-05-06 LAB — CBC WITH DIFFERENTIAL/PLATELET
Abs Immature Granulocytes: 0.04 10*3/uL (ref 0.00–0.07)
Basophils Absolute: 0 10*3/uL (ref 0.0–0.1)
Basophils Relative: 0 %
Eosinophils Absolute: 0.2 10*3/uL (ref 0.0–0.5)
Eosinophils Relative: 3 %
HCT: 33.6 % — ABNORMAL LOW (ref 36.0–46.0)
Hemoglobin: 10.4 g/dL — ABNORMAL LOW (ref 12.0–15.0)
Immature Granulocytes: 1 %
Lymphocytes Relative: 14 %
Lymphs Abs: 1 10*3/uL (ref 0.7–4.0)
MCH: 31 pg (ref 26.0–34.0)
MCHC: 31 g/dL (ref 30.0–36.0)
MCV: 100 fL (ref 80.0–100.0)
Monocytes Absolute: 0.8 10*3/uL (ref 0.1–1.0)
Monocytes Relative: 11 %
Neutro Abs: 5.1 10*3/uL (ref 1.7–7.7)
Neutrophils Relative %: 71 %
Platelets: 165 10*3/uL (ref 150–400)
RBC: 3.36 MIL/uL — ABNORMAL LOW (ref 3.87–5.11)
RDW: 15.5 % (ref 11.5–15.5)
WBC: 7.1 10*3/uL (ref 4.0–10.5)
nRBC: 0 % (ref 0.0–0.2)

## 2020-05-06 LAB — MAGNESIUM: Magnesium: 1.5 mg/dL — ABNORMAL LOW (ref 1.7–2.4)

## 2020-05-06 LAB — ECHOCARDIOGRAM COMPLETE
Height: 62 in
Weight: 2736 oz

## 2020-05-06 MED ORDER — ADULT MULTIVITAMIN W/MINERALS CH
1.0000 | ORAL_TABLET | Freq: Every day | ORAL | Status: DC
  Administered 2020-05-06 – 2020-05-08 (×3): 1 via ORAL
  Filled 2020-05-06 (×3): qty 1

## 2020-05-06 MED ORDER — MAGNESIUM SULFATE 2 GM/50ML IV SOLN
2.0000 g | Freq: Once | INTRAVENOUS | Status: AC
  Administered 2020-05-06: 2 g via INTRAVENOUS
  Filled 2020-05-06: qty 50

## 2020-05-06 MED ORDER — ENSURE ENLIVE PO LIQD
237.0000 mL | Freq: Three times a day (TID) | ORAL | Status: DC
  Administered 2020-05-06 – 2020-05-08 (×4): 237 mL via ORAL

## 2020-05-06 NOTE — Evaluation (Signed)
Physical Therapy Evaluation Patient Details Name: Haley Johnson MRN: [id number] DOB: 08-Oct-1946 Today's Date: 05/06/2020   History of Present Illness  Patient is a 74 y/o female who presents with BLE swelling and weakness. Pt with pelvic abscess. PMh includes HTN, Dermatomyositis, HTN, DM.  Clinical Impression  Patient presents with generalized weakness, impaired balance, pain, fatigue and impaired mobility s/p above. Pt lives alone and reports needing help with ADls from cousin PTA, using RW vs SPC for ambulation. Pt's cousin lives next door and she reports they can help her at d/c as they have been. No falls reported. Today, pt tolerated transfers and gait training with Min guard assist for balance/safety. Fatigues and noted to have RLE weakness at hip with increased distance. Pt is a fall risk at this time. Will follow acutely to maximize independence and mobility prior to return home.    Follow Up Recommendations Home health PT;Supervision for mobility/OOB    Equipment Recommendations  None recommended by PT    Recommendations for Other Services       Precautions / Restrictions Precautions Precautions: Fall Restrictions Weight Bearing Restrictions: No      Mobility  Bed Mobility Overal bed mobility: Modified Independent             General bed mobility comments: Able to get to EOB and into bed with HOB elevated, no assist.  Transfers Overall transfer level: Needs assistance Equipment used: None;Rolling walker (2 wheeled) Transfers: Sit to/from [facility] Sit to Stand: Min guard Stand pivot transfers: Min guard       General transfer comment: Min guard for safety. Stood from [employer], from [facility] x1. SPT bed to/from [facility].  Ambulation/Gait Ambulation/Gait assistance: Min guard Gait Distance (Feet): 40 Feet Assistive device: Rolling walker (2 wheeled) Gait Pattern/deviations: Step-through pattern;Decreased stride length;Decreased stance time -  right;Antalgic Gait velocity: decreased Gait velocity interpretation: <1.31 ft/sec, indicative of household ambulator General Gait Details: Slow, unsteady gait esp with increased distance with RLE weakness at hip. Fatigues.  Stairs            Wheelchair Mobility    Modified Rankin (Stroke Patients Only)       Balance Overall balance assessment: Needs assistance Sitting-balance support: Feet supported;No upper extremity supported Sitting balance-Leahy Scale: Good     Standing balance support: During functional activity Standing balance-Leahy Scale: Fair Standing balance comment: Statically                             Pertinent Vitals/Pain Pain Assessment: 0-10 Pain Score: 7  Pain Location: right back, LE Pain Descriptors / Indicators: Sore Pain Intervention(s): Repositioned;Monitored during session    Home Living Family/patient expects to be discharged to:: Private residence Living Arrangements: Alone Available Help at Discharge: Family;Available PRN/intermittently Type of Home: House Home Access: Level entry     Home Layout: One level Home Equipment: Walker - 2 wheels;Cane - single point      Prior Function Level of Independence: Needs assistance   Gait / Transfers Assistance Needed: uses RW vs SPC for ambulation. no falls in last 6 months.  ADL's / Homemaking Assistance Needed: Cousin helps with ADls esp recently.        Hand Dominance   Dominant Hand: Right    Extremity/Trunk Assessment   Upper Extremity Assessment Upper Extremity Assessment: Defer to OT evaluation    Lower Extremity Assessment Lower Extremity Assessment: RLE deficits/detail;Generalized weakness RLE Deficits / Details: Fatigues at  hip with mobility       Communication   Communication: HOH  Cognition Arousal/Alertness: Awake/alert Behavior During Therapy: WFL for tasks assessed/performed Overall Cognitive Status: Within Functional Limits for tasks assessed                                  General Comments: question safety awareness/deficits?      General Comments General comments (skin integrity, edema, etc.): Swelling present BLEs and into feet, tender to touch; improved per patient    Exercises     Assessment/Plan    PT Assessment Patient needs continued PT services  PT Problem List Decreased strength;Decreased mobility;Decreased safety awareness;Pain;Decreased balance;Decreased activity tolerance       PT Treatment Interventions Therapeutic activities;Gait training;Therapeutic exercise;Patient/family education;Balance training;Functional mobility training    PT Goals (Current goals can be found in the Care Plan section)  Acute Rehab PT Goals Patient Stated Goal: to get better and go home PT Goal Formulation: With patient Time For Goal Achievement: 05/20/20 Potential to Achieve Goals: Good    Frequency Min 3X/week   Barriers to discharge Decreased caregiver support has a cousin that pt reports lives next door and can stay with her if need be    Co-evaluation               AM-PAC PT "6 Clicks" Mobility  Outcome Measure Help needed turning from your back to your side while in a flat bed without using bedrails?: None Help needed moving from lying on your back to sitting on the side of a flat bed without using bedrails?: None Help needed moving to and from a bed to a chair (including a wheelchair)?: A Little Help needed standing up from a chair using your arms (e.g., wheelchair or bedside chair)?: A Little Help needed to walk in hospital room?: A Little Help needed climbing 3-5 steps with a railing? : A Little 6 Click Score: 20    End of Session Equipment Utilized During Treatment: Gait belt Activity Tolerance: Patient limited by fatigue Patient left: in bed;with call bell/phone within reach;with bed alarm set Nurse Communication: Mobility status PT Visit Diagnosis: Pain;Muscle weakness (generalized)  (M62.81);Difficulty in walking, not elsewhere classified (R26.2);Unsteadiness on feet (R26.81) Pain - Right/Left: Right Pain - part of body: Hip (back)    Time: 2197-5883 PT Time Calculation (min) (ACUTE ONLY): 17 min   Charges:   PT Evaluation $PT Eval Moderate Complexity: 1 Mod          Marisa Severin, PT, DPT Acute Rehabilitation Services Pager [phone] Office [phone]      Marguarite Arbour A Sabra Heck 05/06/2020, 10:43 AM

## 2020-05-06 NOTE — Progress Notes (Signed)
Interventional Radiology Brief Note:  Haley Johnson is a 74 year old female with past medical history of hypertension, gastroesophageal reflux disease, hyperlipidemia, diabetes mellitus,dermatomyositis, and lymphedema who presented to [facility] ED with lower extremity weakness. Patient recently evaluated 2 weeks ago at [facility] where she was found to have a pelvic abscess.  The abscess was reportedly aspirated with low yield and no drain was placed.  She was ultimately discharged with PO abx.   She had no fever or white count elevated on admission, however her lactic acid was 3.  She was admitted for concern for sepsis related to pelvic abscess.   Imaging reviewed by Dr. Kathlene Cote who notes that the possible abscess is approximated by several vessels precluding safe right percutaneous transgluteal approach at this time.  Reviewed reports in Care Everywhere from prior imaging and ultrasound-guided aspiration which do not specify the size of the collection when found in early June, but do characterize it as predominately phlegmon/cellulitis with minimal fluid component.  She has now been on antibiotics x2 weeks and main complaint is lower extremity weakness.   No aspiration/drainage planned in IR at this time.  Could consider repeat CT if symptoms worsen or clinical signs indicative of worsening abscess. Could also consider surgical consultation as abscess is likely, but collapsed ovarian cyst vs. Neoplasm also possible.   There is no suggestion by CT that this collection would be causing her symptoms of lower extremity swelling.   Paged Dr. Kurtis Bushman to notify.   Brynda Greathouse, MS RD PA-C 5:08 PM

## 2020-05-06 NOTE — Progress Notes (Signed)
PROGRESS NOTE    Haley Johnson  AVW:[id number] DOB: [birth year] DOA: 05/04/2020 PCP: Patient, No Pcp Per    Brief Narrative:  74 year old female with past medical history of hypertension, gastroesophageal reflux disease, hyperlipidemia, diabetes mellitus (patient disputes this), dermatomyositis, gastroesophageal reflux disease and lymphedema who presents to [facility] emergency department with complaints of bilateral lower extremity swelling and lower extremity weakness.  Of note, patient was recently discharged from [facility] in [place] after she was found to have a pelvic abscess after presenting with a several month history of progressive low back pain.  Discharged on a home-going regimen of oral Augmentin on 04/28/2020.     Consultants:     Procedures:   Antimicrobials:   CEFEPIME and metronidazole   Subjective: HAS NO COMPLAINTS THIS AM.   Objective: Vitals:   05/05/20 1431 05/05/20 2230 05/06/20 0504 05/06/20 1448  BP: 120/60 138/69 (!) 155/73 118/68  Pulse: 79 85 86 84  Resp: 16 20 16 18   Temp: 98.6 F (37 C) 99.2 F (37.3 C) 98.6 F (37 C) 98.5 F (36.9 C)  TempSrc: Oral Oral Oral Oral  SpO2: 100% 100% 100% 99%  Weight:      Height:        Intake/Output Summary (Last 24 hours) at 05/06/2020 1622 Last data filed at 05/06/2020 1300 Gross per 24 hour  Intake 855.81 ml  Output --  Net 855.81 ml   Filed Weights   05/04/20 1816  Weight: 77.6 kg    Examination:  General exam: Appears calm and comfortable  Respiratory system: Clear to auscultation. Respiratory effort normal. Cardiovascular system: S1 & S2 heard, RRR. No JVD, murmurs, rubs, gallops or clicks. Gastrointestinal system: Abdomen is nondistended, soft and nontender.. Normal bowel sounds heard. Central nervous system: Alert and oriented.  Grossly intact Extremities: No edema Skin: Warm dry Psychiatry: Judgement and insight appear normal. Mood & affect  appropriate.     Data Reviewed: I have personally reviewed following labs and imaging studies  CBC: Recent Labs  Lab 05/04/20 1825 05/06/20 0607  WBC 7.5 7.1  NEUTROABS  --  5.1  HGB 9.9* 10.4*  HCT 31.8* 33.6*  MCV 98.5 100.0  PLT 144* 829   Basic Metabolic Panel: Recent Labs  Lab 05/04/20 1825 05/06/20 0607  NA 136 134*  K 4.3 4.0  CL 103 100  CO2 25 25  GLUCOSE 85 86  BUN 15 13  CREATININE 1.08* 0.86  CALCIUM 8.7* 8.4*  MG  --  1.5*   GFR: Estimated Creatinine Clearance: 57 mL/min (by C-G formula based on SCr of 0.86 mg/dL). Liver Function Tests: Recent Labs  Lab 05/04/20 2346 05/06/20 0607  AST 78* 70*  ALT 43 36  ALKPHOS 36* 34*  BILITOT 0.9 0.8  PROT 6.1* 5.6*  ALBUMIN 2.6* 2.3*   Recent Labs  Lab 05/04/20 2346  LIPASE 20   No results for input(s): AMMONIA in the last 168 hours. Coagulation Profile: Recent Labs  Lab 05/05/20 0338  INR 1.2   Cardiac Enzymes: No results for input(s): CKTOTAL, CKMB, CKMBINDEX, TROPONINI in the last 168 hours. BNP (last 3 results) No results for input(s): PROBNP in the last 8760 hours. HbA1C: Recent Labs    05/05/20 0338  HGBA1C 6.2*   CBG: No results for input(s): GLUCAP in the last 168 hours. Lipid Profile: No results for input(s): CHOL, HDL, LDLCALC, TRIG, CHOLHDL, LDLDIRECT in the last 72 hours. Thyroid Function Tests: Recent Labs    05/04/20 2346  TSH 0.556  FREET4 1.06   Anemia Panel: No results for input(s): VITAMINB12, FOLATE, FERRITIN, TIBC, IRON, RETICCTPCT in the last 72 hours. Sepsis Labs: Recent Labs  Lab 05/05/20 0045 05/05/20 0348  LATICACIDVEN 3.1* 1.9    Recent Results (from the past 240 hour(s))  Blood culture (routine x 2)     Status: None (Preliminary result)   Collection Time: 05/05/20 12:37 AM   Specimen: BLOOD  Result Value Ref Range Status   Specimen Description BLOOD LEFT ANTECUBITAL  Final   Special Requests   Final    BOTTLES DRAWN AEROBIC AND ANAEROBIC Blood  Culture results may not be optimal due to an excessive volume of blood received in culture bottles   Culture   Final    NO GROWTH 1 DAY Performed at [place] [address], [place], [place] [address]    Report Status PENDING  Incomplete  Blood culture (routine x 2)     Status: None (Preliminary result)   Collection Time: 05/05/20 12:52 AM   Specimen: BLOOD RIGHT ARM  Result Value Ref Range Status   Specimen Description BLOOD RIGHT ARM  Final   Special Requests   Final    BOTTLES DRAWN AEROBIC AND ANAEROBIC Blood Culture adequate volume   Culture   Final    NO GROWTH 1 DAY Performed at [facility], [place] [address], [place], [place] [address]    Report Status PENDING  Incomplete  SARS Coronavirus 2 by RT PCR (hospital order, performed in [facility] hospital lab) Nasopharyngeal Nasopharyngeal Swab     Status: None   Collection Time: 05/05/20  4:34 AM   Specimen: Nasopharyngeal Swab  Result Value Ref Range Status   SARS Coronavirus 2 NEGATIVE NEGATIVE Final    Comment: (NOTE) SARS-CoV-2 target nucleic acids are NOT DETECTED.  The SARS-CoV-2 RNA is generally detectable in upper and lower respiratory specimens during the acute phase of infection. The lowest concentration of SARS-CoV-2 viral copies this assay can detect is 250 copies / mL. A negative result does not preclude SARS-CoV-2 infection and should not be used as the sole basis for treatment or other patient management decisions.  A negative result may occur with improper specimen collection / handling, submission of specimen other than nasopharyngeal swab, presence of viral mutation(s) within the areas targeted by this assay, and inadequate number of viral copies (<250 copies / mL). A negative result must be combined with clinical observations, patient history, and epidemiological information.  Fact Sheet for Patients:   StrictlyIdeas.no  Fact Sheet for Healthcare  Providers: BankingDealers.co.za  This test is not yet approved or  cleared by the [place] FDA and has been authorized for detection and/or diagnosis of SARS-CoV-2 by FDA under an Emergency Use Authorization (EUA).  This EUA will remain in effect (meaning this test can be used) for the duration of the COVID-19 declaration under Section 564(b)(1) of the Act, 21 U.S.C. section 360bbb-3(b)(1), unless the authorization is terminated or revoked sooner.  Performed at [facility], [place] [address] [place], [place] [address]          Radiology Studies: CT Chest W Contrast  Result Date: 05/05/2020 CLINICAL DATA:  Bilateral lower extremity edema. EXAM: CT CHEST, ABDOMEN, AND PELVIS WITH CONTRAST TECHNIQUE: Multidetector CT imaging of the chest, abdomen and pelvis was performed following the standard protocol during bolus administration of intravenous contrast. CONTRAST:  142mL OMNIPAQUE IOHEXOL 300 MG/ML  SOLN COMPARISON:  None. FINDINGS: CT CHEST FINDINGS  Cardiovascular: Scattered coronary artery calcifications. Heart is normal size. Aorta is normal caliber. Retroesophageal right subclavian artery. Mediastinum/Nodes: No mediastinal, hilar, or axillary adenopathy. Trachea and esophagus are unremarkable. Lungs/Pleura: Lungs are clear. No focal airspace opacities or suspicious nodules. No effusions. Musculoskeletal: Chest wall soft tissues are unremarkable. No acute bony abnormality. CT ABDOMEN PELVIS FINDINGS Hepatobiliary: Diffuse fatty infiltration of the liver. No suspicious focal hepatic abnormality. Small cyst in the right hepatic lobe. Gallbladder unremarkable. Pancreas: No focal abnormality or ductal dilatation. Spleen: No focal abnormality.  Normal size. Adrenals/Urinary Tract: No adrenal abnormality. No focal renal abnormality. No stones or hydronephrosis. Urinary bladder is unremarkable. Stomach/Bowel: Stomach, large and small bowel grossly unremarkable.  Vascular/Lymphatic: Aortic atherosclerosis. No enlarged abdominal or pelvic lymph nodes. Reproductive: Uterus is unremarkable. There is a 3.1 cm fluid collection seen in the cul-de-sac posterior to the uterus with enhancing rim. It is unclear if this is ovarian in origin or could represent a focal pelvic abscess. Other: Small amount of free fluid in the pelvis.  No free air. Musculoskeletal: No acute bony abnormality. IMPRESSION: Scattered coronary artery calcifications. Diffuse fatty infiltration of the liver. 3.1 cm fluid collection in the cul-de-sac between the uterus and sigmoid colon. This could be ovarian in origin or could reflect a pelvic abscess. Consider further evaluation with pelvic ultrasound. Electronically Signed   By: Rolm Baptise M.D.   On: 05/05/2020 01:24   US Pelvis Complete  Result Date: 05/05/2020 CLINICAL DATA:  Pelvic fluid collection. EXAM: TRANSABDOMINAL ULTRASOUND OF PELVIS TECHNIQUE: Transabdominal ultrasound examination of the pelvis was performed including evaluation of the uterus, ovaries, adnexal regions, and pelvic cul-de-sac. COMPARISON:  CT from same day. FINDINGS: Uterus Measurements: 8 x 3.4 x 4.5 cm = volume: 63 mL. No fibroids or other mass visualized. Endometrium: The endometrium was poorly evaluated. Right ovary Not visualized Left ovary Not visualized Other findings:  No abnormal free fluid. IMPRESSION: 1. Very limited study. 2. Neither ovary was visualized. 3. The collection in the patient's pelvis identified on the prior CT was not visualized on this study. 4. Suboptimal evaluation of the endometrium on this transabdominal only examination. Electronically Signed   By: Constance Holster M.D.   On: 05/05/2020 02:56   CT ABDOMEN PELVIS W CONTRAST  Result Date: 05/05/2020 CLINICAL DATA:  Bilateral lower extremity edema. EXAM: CT CHEST, ABDOMEN, AND PELVIS WITH CONTRAST TECHNIQUE: Multidetector CT imaging of the chest, abdomen and pelvis was performed following the  standard protocol during bolus administration of intravenous contrast. CONTRAST:  160mL OMNIPAQUE IOHEXOL 300 MG/ML  SOLN COMPARISON:  None. FINDINGS: CT CHEST FINDINGS Cardiovascular: Scattered coronary artery calcifications. Heart is normal size. Aorta is normal caliber. Retroesophageal right subclavian artery. Mediastinum/Nodes: No mediastinal, hilar, or axillary adenopathy. Trachea and esophagus are unremarkable. Lungs/Pleura: Lungs are clear. No focal airspace opacities or suspicious nodules. No effusions. Musculoskeletal: Chest wall soft tissues are unremarkable. No acute bony abnormality. CT ABDOMEN PELVIS FINDINGS Hepatobiliary: Diffuse fatty infiltration of the liver. No suspicious focal hepatic abnormality. Small cyst in the right hepatic lobe. Gallbladder unremarkable. Pancreas: No focal abnormality or ductal dilatation. Spleen: No focal abnormality.  Normal size. Adrenals/Urinary Tract: No adrenal abnormality. No focal renal abnormality. No stones or hydronephrosis. Urinary bladder is unremarkable. Stomach/Bowel: Stomach, large and small bowel grossly unremarkable. Vascular/Lymphatic: Aortic atherosclerosis. No enlarged abdominal or pelvic lymph nodes. Reproductive: Uterus is unremarkable. There is a 3.1 cm fluid collection seen in the cul-de-sac posterior to the uterus with enhancing rim. It is unclear if this is ovarian  in origin or could represent a focal pelvic abscess. Other: Small amount of free fluid in the pelvis.  No free air. Musculoskeletal: No acute bony abnormality. IMPRESSION: Scattered coronary artery calcifications. Diffuse fatty infiltration of the liver. 3.1 cm fluid collection in the cul-de-sac between the uterus and sigmoid colon. This could be ovarian in origin or could reflect a pelvic abscess. Consider further evaluation with pelvic ultrasound. Electronically Signed   By: Rolm Baptise M.D.   On: 05/05/2020 01:24   DG Chest Portable 1 View  Result Date: 05/05/2020 CLINICAL DATA:   Shortness of breath. EXAM: PORTABLE CHEST 1 VIEW COMPARISON:  None. FINDINGS: The heart size is borderline enlarged. There is no pneumothorax or large pleural effusion. No focal infiltrate. There is a small density in the left perihilar region measuring approximately 7 mm and projecting over the posterior seventh rib on the left. IMPRESSION: 1. No acute cardiopulmonary process. 2. Small 7 mm density in the left perihilar region. A 4-6 week follow-up two-view chest x-ray is recommended to confirm resolution of this finding. Electronically Signed   By: Constance Holster M.D.   On: 05/05/2020 00:15   VAS [place] LOWER EXTREMITY VENOUS (DVT) (ONLY [facility] & [facility])  Result Date: 05/05/2020  Lower Venous DVT Study Indications: Edema.  Risk Factors: Pelvic abscesss. Limitations: Significant edema. Comparison Study: No prior study on file for comparison Performing Technologist: Sharion Dove RVS  Examination Guidelines: A complete evaluation includes B-mode imaging, spectral Doppler, color Doppler, and power Doppler as needed of all accessible portions of each vessel. Bilateral testing is considered an integral part of a complete examination. Limited examinations for reoccurring indications may be performed as noted. The reflux portion of the exam is performed with the patient in reverse Trendelenburg.  +---------+---------------+---------+-----------+----------+--------------+ RIGHT    CompressibilityPhasicitySpontaneityPropertiesThrombus Aging +---------+---------------+---------+-----------+----------+--------------+ CFV      Full           Yes      Yes                                 +---------+---------------+---------+-----------+----------+--------------+ SFJ      Full                                                        +---------+---------------+---------+-----------+----------+--------------+ FV Prox  Full                                                         +---------+---------------+---------+-----------+----------+--------------+ FV Mid   Full                                                        +---------+---------------+---------+-----------+----------+--------------+ FV DistalFull                                                        +---------+---------------+---------+-----------+----------+--------------+  PFV      Full                                                        +---------+---------------+---------+-----------+----------+--------------+ POP      Full           Yes      Yes                                 +---------+---------------+---------+-----------+----------+--------------+ PTV      Full                                                        +---------+---------------+---------+-----------+----------+--------------+ PERO     Full                                                        +---------+---------------+---------+-----------+----------+--------------+   +---------+---------------+---------+-----------+----------+---------------+ LEFT     CompressibilityPhasicitySpontaneityPropertiesThrombus Aging  +---------+---------------+---------+-----------+----------+---------------+ CFV      Full           Yes      Yes                                  +---------+---------------+---------+-----------+----------+---------------+ SFJ      Full                                                         +---------+---------------+---------+-----------+----------+---------------+ FV Prox  Full                                                         +---------+---------------+---------+-----------+----------+---------------+ FV Mid   Full                                                         +---------+---------------+---------+-----------+----------+---------------+ FV DistalFull                                                          +---------+---------------+---------+-----------+----------+---------------+ PFV      Full                                                         +---------+---------------+---------+-----------+----------+---------------+  POP      Full           Yes      Yes                                  +---------+---------------+---------+-----------+----------+---------------+ PTV                                                   patent by color +---------+---------------+---------+-----------+----------+---------------+ PERO                                                  patent by color +---------+---------------+---------+-----------+----------+---------------+     Summary: RIGHT: - There is no evidence of deep vein thrombosis in the lower extremity.  LEFT: - There is no evidence of deep vein thrombosis in the lower extremity. However, portions of this examination were limited- see technologist comments above.  *See table(s) above for measurements and observations. Electronically signed by Monica Martinez MD on 05/05/2020 at 11:15:00 AM.    Final         Scheduled Meds: . amLODipine  5 mg Oral Daily  . bisoprolol-hydrochlorothiazide  1 tablet Oral Daily  . enoxaparin (LOVENOX) injection  40 mg Subcutaneous Q24H  . famotidine  20 mg Oral Daily  . feeding supplement (ENSURE ENLIVE)  237 mL Oral TID BM  . gabapentin  200 mg Oral QHS  . multivitamin with minerals  1 tablet Oral Daily  . mycophenolate  1,000 mg Oral BID  . predniSONE  10 mg Oral Daily  . rosuvastatin  40 mg Oral Daily   Continuous Infusions: . ceFEPime (MAXIPIME) IV 2 g (05/06/20 0512)  . magnesium sulfate bolus IVPB    . metronidazole 500 mg (05/06/20 1247)    Assessment & Plan:   Principal Problem:   Pelvic abscess in female Active Problems:   Bilateral lower extremity edema   Lactic acidosis   Essential hypertension   GERD without esophagitis   Dermatomyositis (HCC)   Hypoalbuminemia   Mixed  hyperlipidemia   Postherpetic neuralgia   Pelvic abscess in female Continue IV cefepime and metronidazole. ID consulted and input was appreciated until IR aspiration pelvic abscess IR consulted for I&D   Bilateral lower extremity edema Beyond baseline Etiology possibly -while acute congestive heart failure is possible due to recent hospitalization for sepsis and intravenous volume resuscitation - other possible etiologies of bilateral lower extremity edema include protein calorie malnutrition or nephrotic range proteinuria leading to substantial hypoalbuminemia, decreased oncotic pressure and third spacing of the lower extremities. -will ck bnp Given a trial  trial of Lasix 20 mg IV despite having a mild lactic acidosis and will monitor for symptomatic improvement Obtaining urinalysis with spot protein creatinine ratio to identify quantity of proteinuria Obtaining echocardiogram pending Vascular LE [place] pending     Lactic acidosis Lactic acidosis of 3.0 , improved now, today 1.9 Patient was volume overloaded. See above Continue to treat underlying infection/pelvic abscess with intravenous antibiotic therapy.      Essential hypertension Continue home regimen of antihypertensive therapy    Dermatomyositis ([place]) Longstanding known history of dermatomyositis Patient follows with [facility] rheumatology Continue  home regimen of prednisone 10 mg daily and CellCept 1000 mg twice daily. Patient is immunocompromise due to this immunomodulating regimen    Hypoalbuminemia Please see assessment and plan above  Obtaining urinalysis  Obtaining spot protein and creatinine ratio  CT abdomen pelvis reveals no evidence of hepatic cirrhosis  Providing patient with nutritional supplements , dietition input was appreicated-see note     Mixed hyperlipidemia Continue home regimen of statin therapy    GERD without esophagitis  Continue home regimen of H2 blocker  Hypomagnesemia-we  will replace with IV 2 g x 1 Monitor levels   DVT prophylaxis: Lovenox Code Status: Full Family Communication: None at bedside Disposition Plan: Back to previous home life Barrier: Currently medically unstable for discharge.  Needs IV antibiotics, IV Lasix and work-up still pending for her lower extremity edema and pelvic abscess.  Still needs IR for drainage Anticipated discharge >3 days       LOS: 1 day   Time spent: 10min with >50% on coc    Nolberto Hanlon, MD [facility] Pager [phone]  If 7PM-7AM, please contact night-coverage [email or web address] Password [facility] 05/06/2020, 4:22 PM

## 2020-05-06 NOTE — Plan of Care (Signed)
  Problem: Education: Goal: Knowledge of General Education information will improve Description Including pain rating scale, medication(s)/side effects and non-pharmacologic comfort measures Outcome: Progressing   

## 2020-05-06 NOTE — Progress Notes (Signed)
  Echocardiogram 2D Echocardiogram has been performed.  Randa Lynn Climmie Cronce 05/06/2020, 4:14 PM

## 2020-05-06 NOTE — TOC Initial Note (Addendum)
Transition of Care [facility]) - Initial/Assessment Note    Patient Details  Name: Haley Johnson MRN: [id number] Date of Birth: [birth year]  Transition of Care [facility]) CM/SW Contact:    Marilu Favre, RN Phone Number: 05/06/2020, 4:17 PM  Clinical Narrative:                 Patient from home alone.   Her physical address that she lives at is  [address], [place] , [place] [address]   PCP is Dr Leanna Sato [facility] [phone] called spoke to [facility] scheduled appointment for May 14, 2020 at 1140 . Patients [employer] Medicare member ID is [id number].   Patient has had [place] [phone]. When call number receive message it has been disconnected. When searching that's the only number found. Called DR [facility] office that's the only number they have for [place]. Only other agency in patient's area is [place]. [employer] spoke with Gay Filler. Janeece Riggers will check to see if patient's insurance plan is in network. Gay Filler will call NCM back in am.  Patient states she has [facility] but misplaced her insurance card. She has called [facility] and they are sending her a replacement card.   Explained PT recommendation is for HHPT and supervision. Her cousin lives next door and can provide assistance. Patient has DME.   NCM asked if she could call insurance company and get information so NCM can arrange home health, patient stated she will wait for replacement card  Expected Discharge Plan: [place]     Patient Goals and CMS Choice Patient states their goals for this hospitalization and ongoing recovery are:: to return to home CMS Medicare.gov Compare Post Acute Care list provided to:: Patient Choice offered to / list presented to : Patient  Expected Discharge Plan and Services Expected Discharge Plan: [place]   Discharge Planning Services: CM Consult Post Acute Care Choice: [place] arrangements for the past 2  months: Single Family Home                 DME Arranged: N/A                    Prior Living Arrangements/Services Living arrangements for the past 2 months: Single Family Home Lives with:: Self Patient language and need for interpreter reviewed:: Yes Do you feel safe going back to the place where you live?: Yes      Need for Family Participation in Patient Care: Yes (Comment) Care giver support system in place?: Yes (comment) Current home services: DME Criminal Activity/Legal Involvement Pertinent to Current Situation/Hospitalization: No - Comment as needed  Activities of Daily Living      Permission Sought/Granted   Permission granted to share information with : No              Emotional Assessment Appearance:: Appears stated age Attitude/Demeanor/Rapport: Engaged Affect (typically observed): Accepting Orientation: : Oriented to Self, Oriented to Place, Oriented to  Time, Oriented to Situation Alcohol / Substance Use: Not Applicable Psych Involvement: No (comment)  Admission diagnosis:  Leg edema [R60.0] Pelvic abscess in female [N73.9] Patient Active Problem List   Diagnosis Date Noted  . Postherpetic neuralgia 05/06/2020  . Pelvic abscess in female 05/05/2020  . Bilateral lower extremity edema 05/05/2020  . Lactic acidosis 05/05/2020  . Essential hypertension 05/05/2020  . GERD without esophagitis 05/05/2020  . Dermatomyositis ([place]) 05/05/2020  .  Hypoalbuminemia 05/05/2020  . Mixed hyperlipidemia 05/05/2020   PCP:  Patient, No Pcp Per Pharmacy:   [place], [place] [address] [place] [address] Phone: [phone] Fax: [phone]     Social Determinants of Health (SDOH) Interventions    Readmission Risk Interventions No flowsheet data found.

## 2020-05-06 NOTE — Consult Note (Signed)
[name] for Infectious Disease    Date of Admission:  05/04/2020   Total days of antibiotics 12        Day 2 cefepime        Day 2 metronidazole              Reason for Consult: Pelvic abscess    Referring Provider: Dr. Nolberto Hanlon  Assessment: Without more complete records from [facility] I cannot determine if her pelvic abscess is the same, better or worse.  I agree with having IR reaspirate the fluid primarily for therapeutic purposes.  It is likely that the Gram stain and cultures will be negative again given that she has been on antibiotics for nearly 2 weeks.  Plan: 1. Continue current antibiotics pending INR aspiration of her pelvic abscess  Principal Problem:   Pelvic abscess in female Active Problems:   Bilateral lower extremity edema   Lactic acidosis   Essential hypertension   GERD without esophagitis   Dermatomyositis (H[facility])   Hypoalbuminemia   Mixed hyperlipidemia   Postherpetic neuralgia   Scheduled Meds: . amLODipine  5 mg Oral Daily  . bisoprolol-hydrochlorothiazide  1 tablet Oral Daily  . enoxaparin (LOVENOX) injection  40 mg Subcutaneous Q24H  . famotidine  20 mg Oral Daily  . feeding supplement (ENSURE ENLIVE)  237 mL Oral BID BM  . gabapentin  200 mg Oral QHS  . mycophenolate  1,000 mg Oral BID  . predniSONE  10 mg Oral Daily  . rosuvastatin  40 mg Oral Daily   Continuous Infusions: . ceFEPime (MAXIPIME) IV 2 g (05/06/20 0512)  . magnesium sulfate bolus IVPB    . metronidazole 500 mg (05/06/20 1247)   PRN Meds:.acetaminophen **OR** acetaminophen, albuterol, ondansetron **OR** ondansetron (ZOFRAN) IV, oxyCODONE-acetaminophen, polyethylene glycol  HPI: Haley Johnson is a 74 y.o. female with a history of dermatomyositis and shingles, both diagnosed in 2019.  Ever since she had shingles she has had severe right lower back pain that radiates down her right leg.  She was hospitalized on 04/25/2020 at [facility] in [place] because of this persistent pain.  She was found to have a pelvic abscess.  The abscess was aspirated.  No organisms were seen on Gram stain and abscess cultures were negative.  Admission blood cultures were negative as well.  She was discharged on 04/28/2020 amoxicillin clavulanate.  She has chronic lower extremity lymphedema that began to get much worse recently leading to her hospitalization here 2 days ago.  She recalls that she was told that she had a fever when she was hospitalized recently.  She has been afebrile here but repeat CT scan shows a persistent 3.1 cm rim-enhancing fluid collection in the cul-de-sac between her uterus and sigmoid colon.  She was switched to IV ceftriaxone and metronidazole upon admission.  IR is planning to reaspirate the fluid collection today.   Review of Systems: Review of Systems  Constitutional: Negative for chills, diaphoresis, fever and weight loss.  Respiratory: Negative for cough, sputum production and shortness of breath.   Cardiovascular: Negative for chest pain.  Gastrointestinal: Positive for abdominal pain. Negative for constipation, diarrhea, nausea and vomiting.  Genitourinary: Negative for dysuria.  Musculoskeletal: Positive for back pain.  Skin:       Hyperpigmentation of her skin.    Past Medical History:  Diagnosis Date  . Asthma   . Dermatomyositis ([place]) 05/05/2020  . Essential hypertension  05/05/2020  . GERD (gastroesophageal reflux disease)   . GERD without esophagitis 05/05/2020  . Hypercholesteremia   . Hypertension     Social History   Tobacco Use  . Smoking status: Never Smoker  . Smokeless tobacco: Never Used  Substance Use Topics  . Alcohol use: Not on file  . Drug use: Not on file    Family History  Family history unknown: Yes   Allergies  Allergen Reactions  . Chocolate Swelling    OBJECTIVE: Blood pressure (!) 155/73, pulse 86, temperature 98.6 F (37 C), temperature source  Oral, resp. rate 16, height 5\' 2"  (1.575 m), weight 77.6 kg, SpO2 100 %.  Physical Exam Constitutional:      Comments: She is very pleasant and talkative.  Her son is visiting.  Cardiovascular:     Rate and Rhythm: Normal rate and regular rhythm.     Heart sounds: No murmur heard.   Pulmonary:     Effort: Pulmonary effort is normal.     Breath sounds: Normal breath sounds.  Abdominal:     Palpations: Abdomen is soft.     Tenderness: There is abdominal tenderness.     Comments: She has some mild to moderate right lower quadrant pain with palpation.  Musculoskeletal:     Right lower leg: Edema present.     Left lower leg: Edema present.  Skin:    Comments: Hypopigmentation.  She has healed scars on her lower legs (question from a previous venous stasis ulcers).  Neurological:     General: No focal deficit present.  Psychiatric:        Mood and Affect: Mood normal.     Lab Results Lab Results  Component Value Date   WBC 7.1 05/06/2020   HGB 10.4 (L) 05/06/2020   HCT 33.6 (L) 05/06/2020   MCV 100.0 05/06/2020   PLT 165 05/06/2020    Lab Results  Component Value Date   CREATININE 0.86 05/06/2020   BUN 13 05/06/2020   NA 134 (L) 05/06/2020   K 4.0 05/06/2020   CL 100 05/06/2020   CO2 25 05/06/2020    Lab Results  Component Value Date   ALT 36 05/06/2020   AST 70 (H) 05/06/2020   ALKPHOS 34 (L) 05/06/2020   BILITOT 0.8 05/06/2020     Microbiology: Recent Results (from the past 240 hour(s))  Blood culture (routine x 2)     Status: None (Preliminary result)   Collection Time: 05/05/20 12:37 AM   Specimen: BLOOD  Result Value Ref Range Status   Specimen Description BLOOD LEFT ANTECUBITAL  Final   Special Requests   Final    BOTTLES DRAWN AEROBIC AND ANAEROBIC Blood Culture results may not be optimal due to an excessive volume of blood received in culture bottles   Culture   Final    NO GROWTH 1 DAY Performed at [facility], [place] [address]  [place], [place] [address]    Report Status PENDING  Incomplete  Blood culture (routine x 2)     Status: None (Preliminary result)   Collection Time: 05/05/20 12:52 AM   Specimen: BLOOD RIGHT ARM  Result Value Ref Range Status   Specimen Description BLOOD RIGHT ARM  Final   Special Requests   Final    BOTTLES DRAWN AEROBIC AND ANAEROBIC Blood Culture adequate volume   Culture   Final    NO GROWTH 1 DAY Performed at [facility], [place] [address] [place] [address], [place], [place] [address]  Report Status PENDING  Incomplete  SARS Coronavirus 2 by RT PCR (hospital order, performed in [facility] hospital lab) Nasopharyngeal Nasopharyngeal Swab     Status: None   Collection Time: 05/05/20  4:34 AM   Specimen: Nasopharyngeal Swab  Result Value Ref Range Status   SARS Coronavirus 2 NEGATIVE NEGATIVE Final    Comment: (NOTE) SARS-CoV-2 target nucleic acids are NOT DETECTED.  The SARS-CoV-2 RNA is generally detectable in upper and lower respiratory specimens during the acute phase of infection. The lowest concentration of SARS-CoV-2 viral copies this assay can detect is 250 copies / mL. A negative result does not preclude SARS-CoV-2 infection and should not be used as the sole basis for treatment or other patient management decisions.  A negative result may occur with improper specimen collection / handling, submission of specimen other than nasopharyngeal swab, presence of viral mutation(s) within the areas targeted by this assay, and inadequate number of viral copies (<250 copies / mL). A negative result must be combined with clinical observations, patient history, and epidemiological information.  Fact Sheet for Patients:   StrictlyIdeas.no  Fact Sheet for Healthcare Providers: BankingDealers.co.za  This test is not yet approved or  cleared by the [place] FDA and has been authorized for detection and/or diagnosis of SARS-CoV-2 by FDA under an  Emergency Use Authorization (EUA).  This EUA will remain in effect (meaning this test can be used) for the duration of the COVID-19 declaration under Section 564(b)(1) of the Act, 21 U.S.C. section 360bbb-3(b)(1), unless the authorization is terminated or revoked sooner.  Performed at [facility], [place] [address], [place],  [address]     Michel Bickers, [place] for Infectious [place] [phone] pager   [phone] cell 05/06/2020, 1:20 PM

## 2020-05-06 NOTE — Progress Notes (Signed)
Initial Nutrition Assessment  RD working remotely.  DOCUMENTATION CODES:   Obesity unspecified  INTERVENTION:   -MVI with minerals daily -Increase Ensure Enlive po to TID, each supplement provides 350 kcal and 20 grams of protein  NUTRITION DIAGNOSIS:   Increased nutrient needs related to acute illness (pelvic abscess) as evidenced by estimated needs.  GOAL:   Patient will meet greater than or equal to 90% of their needs  MONITOR:   PO intake, Supplement acceptance, Labs, Weight trends, Skin, I & O's  REASON FOR ASSESSMENT:   Consult Assessment of nutrition requirement/status  ASSESSMENT:   74 year old female with past medical history of hypertension, gastroesophageal reflux disease, hyperlipidemia, diabetes mellitus (patient disputes this), dermatomyositis, gastroesophageal reflux disease and lymphedema who presents to [facility] emergency department with complaints of bilateral lower extremity swelling and lower extremity weakness.  Pt admitted with pelvic abscess.   Reviewed I/O's: +556 ml x 24 hours  UOP: 300 ml x 24 hours  Attempted to speak with pt via phone, however, no answer.   Per ID notes, plan to re-aspirate pelvic abscess.   Pt with fair appetite; noted meal completion 50%. Pt is consuming Ensure supplements, which have already bene ordered.   No wt hx available to assess at this time.   Medications reviewed and include lovenox, prednisone, maxipime, and flagyl.   Albumin has a half-life of 21 days and is strongly affected by stress response and inflammatory process, therefore, do not expect to see an improvement in this lab value during acute hospitalization. When a patient presents with low albumin, it is likely skewed due to the acute inflammatory response.  Unless it is suspected that patient had poor PO intake or malnutrition prior to admission, then RD should not be consulted solely for low albumin. Note that low albumin is no longer used  to diagnose malnutrition; [place] uses the new malnutrition guidelines published by the American Society for Parenteral and Enteral Nutrition (A.S.P.E.N.) and the Academy of Nutrition and Dietetics (AND).    Lab Results  Component Value Date   HGBA1C 6.2 (H) 05/05/2020   PTA DM medications are none.   Labs reviewed: Na: 134. Mg: 1.5 (on IV supplementation).   Diet Order:   Diet Order            Diet Heart Room service appropriate? Yes; Fluid consistency: Thin  Diet effective now                 EDUCATION NEEDS:   No education needs have been identified at this time  Skin:  Skin Assessment: Skin Integrity Issues: Skin Integrity Issues:: Other (Comment) Other: bilateral serous leg blisters  Last BM:  05/05/20  Height:   Ht Readings from Last 1 Encounters:  05/04/20 5\' 2"  (1.575 m)    Weight:   Wt Readings from Last 1 Encounters:  05/04/20 77.6 kg    Ideal Body Weight:  50 kg  BMI:  Body mass index is 31.28 kg/m.  Estimated Nutritional Needs:   Kcal:  1800-2000  Protein:  110-125 grams  Fluid:  > 1.8 L    Loistine Chance, RD, LDN, [place] Registered Dietitian II Certified Diabetes Care and Education Specialist Please refer to [facility] for RD and/or RD on-call/weekend/after hours pager

## 2020-05-07 LAB — BASIC METABOLIC PANEL
Anion gap: 11 (ref 5–15)
BUN: 9 mg/dL (ref 8–23)
CO2: 23 mmol/L (ref 22–32)
Calcium: 8.7 mg/dL — ABNORMAL LOW (ref 8.9–10.3)
Chloride: 101 mmol/L (ref 98–111)
Creatinine, Ser: 0.77 mg/dL (ref 0.44–1.00)
GFR calc Af Amer: >60 mL/min (ref >60)
GFR calc non Af Amer: >60 mL/min (ref >60)
Glucose, Bld: 84 mg/dL (ref 70–99)
Potassium: 4.3 mmol/L (ref 3.5–5.1)
Sodium: 135 mmol/L (ref 135–145)

## 2020-05-07 LAB — MAGNESIUM: Magnesium: 1.9 mg/dL (ref 1.7–2.4)

## 2020-05-07 LAB — BRAIN NATRIURETIC PEPTIDE: B Natriuretic Peptide: 139.4 pg/mL — ABNORMAL HIGH (ref 0.0–100.0)

## 2020-05-07 MED ORDER — AMOXICILLIN-POT CLAVULANATE 875-125 MG PO TABS: 1.0000 | ORAL_TABLET | Freq: Two times a day (BID) | ORAL | 0 refills | Status: DC

## 2020-05-07 MED ORDER — BACID PO TABS: 2.0000 | ORAL_TABLET | Freq: Three times a day (TID) | ORAL | 0 refills | Status: AC

## 2020-05-07 MED ORDER — GABAPENTIN 300 MG PO CAPS
300.0000 mg | ORAL_CAPSULE | Freq: Three times a day (TID) | ORAL | Status: DC
  Administered 2020-05-07 – 2020-05-08 (×4): 300 mg via ORAL
  Filled 2020-05-07 (×5): qty 1

## 2020-05-07 MED ORDER — AMOXICILLIN-POT CLAVULANATE 875-125 MG PO TABS
1.0000 | ORAL_TABLET | Freq: Two times a day (BID) | ORAL | Status: DC
  Administered 2020-05-08: 1 via ORAL
  Filled 2020-05-07: qty 1

## 2020-05-07 MED ORDER — BACID PO TABS
2.0000 | ORAL_TABLET | Freq: Three times a day (TID) | ORAL | Status: DC
  Administered 2020-05-07 – 2020-05-08 (×4): 2 via ORAL
  Filled 2020-05-07 (×6): qty 2

## 2020-05-07 NOTE — TOC Progression Note (Addendum)
Transition of Care [facility]) - Progression Note    Patient Details  Name: Merikay Lesniewski MRN: [id number] Date of Birth: [birth year]  Transition of Care [facility]) CM/SW Contact  Charlsey Moragne, Edson Snowball, RN Phone Number: 05/07/2020, 1:21 PM  Clinical Narrative:     Gay Filler with [facility] can accept referral for HHPT. Requested HHPT orders and face to face.  Will fax referral to [facility] at [phone]  [phone] referral faxed   Expected Discharge Plan: [place]    Expected Discharge Plan and Services Expected Discharge Plan: [place]   Discharge Planning Services: CM Consult Post Acute Care Choice: [place] arrangements for the past 2 months: Single Family Home                 DME Arranged: N/A                     Social Determinants of Health (SDOH) Interventions    Readmission Risk Interventions No flowsheet data found.

## 2020-05-07 NOTE — Progress Notes (Signed)
The chaplain responded to a referral for prayer. The chaplain prayed with the patient and the patient was grateful for the prayer. The chaplain is available if needed.  Brion Aliment Chaplain Resident For questions concerning this note please contact me by pager [phone]

## 2020-05-07 NOTE — Progress Notes (Signed)
PROGRESS NOTE    Haley Johnson  LPF:[id number] DOB: 1946-09-13 DOA: 05/04/2020 PCP: Patient, No Pcp Per    Brief Narrative:  74 year old female with past medical history of hypertension, gastroesophageal reflux disease, hyperlipidemia, diabetes mellitus (patient disputes this), dermatomyositis, gastroesophageal reflux disease and lymphedema who presents to [facility] emergency department with complaints of bilateral lower extremity swelling and lower extremity weakness.  Of note, patient was recently discharged from [facility] in [place] after she was found to have a pelvic abscess after presenting with a several month history of progressive low back pain.  Discharged on a home-going regimen of oral Augmentin on 04/28/2020.     Consultants:     Procedures:   Antimicrobials:   CEFEPIME and metronidazole   Subjective: HAS NO COMPLAINTS THIS AM.   Objective: Vitals:   05/05/20 2230 05/06/20 0504 05/06/20 1448 05/07/20 0404  BP: 138/69 (!) 155/73 118/68 130/64  Pulse: 85 86 84 82  Resp: 20 16 18 16   Temp: 99.2 F (37.3 C) 98.6 F (37 C) 98.5 F (36.9 C) 98.6 F (37 C)  TempSrc: Oral Oral Oral Oral  SpO2: 100% 100% 99% 96%  Weight:      Height:        Intake/Output Summary (Last 24 hours) at 05/07/2020 1414 Last data filed at 05/07/2020 0600 Gross per 24 hour  Intake 940 ml  Output --  Net 940 ml   Filed Weights   05/04/20 1816  Weight: 77.6 kg    Examination:  General exam: Appears calm and comfortable  Respiratory system: Clear to auscultation. Respiratory effort normal. Cardiovascular system: S1 & S2 heard, RRR. No JVD, murmurs, rubs, gallops or clicks. Gastrointestinal system: Abdomen is nondistended, soft and nontender.. Normal bowel sounds heard. Central nervous system: Alert and oriented.  Grossly intact Extremities: No edema Skin: Warm dry Psychiatry: Judgement and insight appear normal. Mood & affect appropriate.      Data Reviewed: I have personally reviewed following labs and imaging studies  CBC: Recent Labs  Lab 05/04/20 1825 05/06/20 0607  WBC 7.5 7.1  NEUTROABS  --  5.1  HGB 9.9* 10.4*  HCT 31.8* 33.6*  MCV 98.5 100.0  PLT 144* 532   Basic Metabolic Panel: Recent Labs  Lab 05/04/20 1825 05/06/20 0607 05/07/20 0609  NA 136 134* 135  K 4.3 4.0 4.3  CL 103 100 101  CO2 25 25 23   GLUCOSE 85 86 84  BUN 15 13 9   CREATININE 1.08* 0.86 0.77  CALCIUM 8.7* 8.4* 8.7*  MG  --  1.5* 1.9   GFR: Estimated Creatinine Clearance: 61.3 mL/min (by C-G formula based on SCr of 0.77 mg/dL). Liver Function Tests: Recent Labs  Lab 05/04/20 2346 05/06/20 0607  AST 78* 70*  ALT 43 36  ALKPHOS 36* 34*  BILITOT 0.9 0.8  PROT 6.1* 5.6*  ALBUMIN 2.6* 2.3*   Recent Labs  Lab 05/04/20 2346  LIPASE 20   No results for input(s): AMMONIA in the last 168 hours. Coagulation Profile: Recent Labs  Lab 05/05/20 0338  INR 1.2   Cardiac Enzymes: No results for input(s): CKTOTAL, CKMB, CKMBINDEX, TROPONINI in the last 168 hours. BNP (last 3 results) No results for input(s): PROBNP in the last 8760 hours. HbA1C: Recent Labs    05/05/20 0338  HGBA1C 6.2*   CBG: No results for input(s): GLUCAP in the last 168 hours. Lipid Profile: No results for input(s): CHOL, HDL, LDLCALC, TRIG, CHOLHDL, LDLDIRECT in the last 72 hours.  Thyroid Function Tests: Recent Labs    05/04/20 2346  TSH 0.556  FREET4 1.06   Anemia Panel: No results for input(s): VITAMINB12, FOLATE, FERRITIN, TIBC, IRON, RETICCTPCT in the last 72 hours. Sepsis Labs: Recent Labs  Lab 05/05/20 0045 05/05/20 0348  LATICACIDVEN 3.1* 1.9    Recent Results (from the past 240 hour(s))  Blood culture (routine x 2)     Status: None (Preliminary result)   Collection Time: 05/05/20 12:37 AM   Specimen: BLOOD  Result Value Ref Range Status   Specimen Description BLOOD LEFT ANTECUBITAL  Final   Special Requests   Final     BOTTLES DRAWN AEROBIC AND ANAEROBIC Blood Culture results may not be optimal due to an excessive volume of blood received in culture bottles   Culture   Final    NO GROWTH 2 DAYS Performed at [place] [address] [place], [place] [address]    Report Status PENDING  Incomplete  Blood culture (routine x 2)     Status: None (Preliminary result)   Collection Time: 05/05/20 12:52 AM   Specimen: BLOOD RIGHT ARM  Result Value Ref Range Status   Specimen Description BLOOD RIGHT ARM  Final   Special Requests   Final    BOTTLES DRAWN AEROBIC AND ANAEROBIC Blood Culture adequate volume   Culture   Final    NO GROWTH 2 DAYS Performed at [facility], [place] [address] [place], [place] [address]    Report Status PENDING  Incomplete  SARS Coronavirus 2 by RT PCR (hospital order, performed in [facility] hospital lab) Nasopharyngeal Nasopharyngeal Swab     Status: None   Collection Time: 05/05/20  4:34 AM   Specimen: Nasopharyngeal Swab  Result Value Ref Range Status   SARS Coronavirus 2 NEGATIVE NEGATIVE Final    Comment: (NOTE) SARS-CoV-2 target nucleic acids are NOT DETECTED.  The SARS-CoV-2 RNA is generally detectable in upper and lower respiratory specimens during the acute phase of infection. The lowest concentration of SARS-CoV-2 viral copies this assay can detect is 250 copies / mL. A negative result does not preclude SARS-CoV-2 infection and should not be used as the sole basis for treatment or other patient management decisions.  A negative result may occur with improper specimen collection / handling, submission of specimen other than nasopharyngeal swab, presence of viral mutation(s) within the areas targeted by this assay, and inadequate number of viral copies (<250 copies / mL). A negative result must be combined with clinical observations, patient history, and epidemiological information.  Fact Sheet for Patients:    StrictlyIdeas.no  Fact Sheet for Healthcare Providers: BankingDealers.co.za  This test is not yet approved or  cleared by the [place] FDA and has been authorized for detection and/or diagnosis of SARS-CoV-2 by FDA under an Emergency Use Authorization (EUA).  This EUA will remain in effect (meaning this test can be used) for the duration of the COVID-19 declaration under Section 564(b)(1) of the Act, 21 U.S.C. section 360bbb-3(b)(1), unless the authorization is terminated or revoked sooner.  Performed at [facility], [place] [address], [place], [place] [address]          Radiology Studies: ECHOCARDIOGRAM COMPLETE  Result Date: 05/06/2020    ECHOCARDIOGRAM REPORT   Patient Name:   Haley Johnson Date of Exam: 05/06/2020 Medical Rec #:  [id number]            Height:       62.0 in Accession #:  [id number]           Weight:       171.0 lb Date of Birth:  March 13, 1946            BSA:          1.789 m Patient Age:    41 years             BP:           155/73 mmHg Patient Gender: F                    HR:           76 bpm. Exam Location:  Inpatient Procedure: 2D Echo, Cardiac Doppler and Color Doppler Indications:    I50.23 Acute on chronic systolic (congestive) heart failure  History:        Patient has no prior history of Echocardiogram examinations.                 Risk Factors:Hypertension, Dyslipidemia and GERD.  Sonographer:    Jonelle Sidle Dance Referring Phys: [id number] [place]  1. Left ventricular ejection fraction, by estimation, is 65 to 70%. The left ventricle has normal function. The left ventricle has no regional wall motion abnormalities. There is mild left ventricular hypertrophy. Left ventricular diastolic parameters are indeterminate.  2. Right ventricular systolic function is normal. The right ventricular size is normal. There is normal pulmonary artery systolic pressure. The estimated right ventricular  systolic pressure is 47.8 mmHg.  3. The mitral valve is normal in structure. No evidence of mitral valve regurgitation.  4. The aortic valve is tricuspid. Aortic valve regurgitation is not visualized. No aortic stenosis is present.  5. The inferior vena cava is normal in size with greater than 50% respiratory variability, suggesting right atrial pressure of 3 mmHg. FINDINGS  Left Ventricle: Left ventricular ejection fraction, by estimation, is 65 to 70%. The left ventricle has normal function. The left ventricle has no regional wall motion abnormalities. The left ventricular internal cavity size was normal in size. There is  mild left ventricular hypertrophy. Left ventricular diastolic parameters are indeterminate. Right Ventricle: The right ventricular size is normal. No increase in right ventricular wall thickness. Right ventricular systolic function is normal. There is normal pulmonary artery systolic pressure. The tricuspid regurgitant velocity is 2.27 m/s, and  with an assumed right atrial pressure of 3 mmHg, the estimated right ventricular systolic pressure is 29.5 mmHg. Left Atrium: Left atrial size was normal in size. Right Atrium: Right atrial size was normal in size. Pericardium: There is no evidence of pericardial effusion. Mitral Valve: The mitral valve is normal in structure. No evidence of mitral valve regurgitation. Tricuspid Valve: The tricuspid valve is normal in structure. Tricuspid valve regurgitation is trivial. Aortic Valve: The aortic valve is tricuspid. Aortic valve regurgitation is not visualized. No aortic stenosis is present. Pulmonic Valve: The pulmonic valve was not well visualized. Pulmonic valve regurgitation is not visualized. Aorta: The aortic root and ascending aorta are structurally normal, with no evidence of dilitation. Venous: The inferior vena cava is normal in size with greater than 50% respiratory variability, suggesting right atrial pressure of 3 mmHg. IAS/Shunts: The  interatrial septum was not well visualized.  LEFT VENTRICLE PLAX 2D LVIDd:         3.40 cm  Diastology LVIDs:         2.50 cm  LV e' lateral:   6.85 cm/s LV PW:  1.20 cm  LV E/e' lateral: 16.8 LV IVS:        1.10 cm  LV e' medial:    8.16 cm/s LVOT diam:     1.90 cm  LV E/e' medial:  14.1 LV SV:         93 LV SV Index:   52 LVOT Area:     2.84 cm  RIGHT VENTRICLE             IVC RV Basal diam:  2.40 cm     IVC diam: 1.70 cm RV S prime:     18.80 cm/s TAPSE (M-mode): 2.5 cm LEFT ATRIUM             Index       RIGHT ATRIUM           Index LA diam:        3.60 cm 2.01 cm/m  RA Area:     14.80 cm LA Vol (A2C):   74.5 ml 41.65 ml/m RA Volume:   34.70 ml  19.40 ml/m LA Vol (A4C):   46.5 ml 26.00 ml/m LA Biplane Vol: 59.8 ml 33.43 ml/m  AORTIC VALVE LVOT Vmax:   175.00 cm/s LVOT Vmean:  92.000 cm/s LVOT VTI:    0.327 m  AORTA Ao Root diam: 3.10 cm Ao Asc diam:  3.15 cm MITRAL VALVE                TRICUSPID VALVE MV Area (PHT): 2.87 cm     TR Peak grad:   20.6 mmHg MV Decel Time: 264 msec     TR Vmax:        227.00 cm/s MV E velocity: 115.00 cm/s MV A velocity: 109.00 cm/s  SHUNTS MV E/A ratio:  1.06         Systemic VTI:  0.33 m                             Systemic Diam: 1.90 cm Oswaldo Milian MD Electronically signed by Oswaldo Milian MD Signature Date/Time: 05/06/2020/9:29:07 PM    Final         Scheduled Meds: . amLODipine  5 mg Oral Daily  . bisoprolol-hydrochlorothiazide  1 tablet Oral Daily  . enoxaparin (LOVENOX) injection  40 mg Subcutaneous Q24H  . famotidine  20 mg Oral Daily  . feeding supplement (ENSURE ENLIVE)  237 mL Oral TID BM  . gabapentin  300 mg Oral TID  . multivitamin with minerals  1 tablet Oral Daily  . mycophenolate  1,000 mg Oral BID  . predniSONE  10 mg Oral Daily  . rosuvastatin  40 mg Oral Daily   Continuous Infusions: . ceFEPime (MAXIPIME) IV 2 g (05/07/20 0535)  . metronidazole 500 mg (05/07/20 1243)    Assessment & Plan:   Principal  Problem:   Pelvic abscess in female Active Problems:   Bilateral lower extremity edema   Lactic acidosis   Essential hypertension   GERD without esophagitis   Dermatomyositis (HCC)   Hypoalbuminemia   Mixed hyperlipidemia   Postherpetic neuralgia   Pelvic abscess  Continue IV cefepime and metronidazole. ID consulted and input was appreciated until IR aspiration pelvic abscess...>IR recommended gsx evaluation....> Gsx input appreciated, recommended OBGYN and possible transvaginal US. Consulted OBGYN, spoke to Dr. Roselie Awkward who will see pt. To see if surgical intervention, I/D.  converting back to oral amoxicillin clavulanate and treating for at least 3 more weeks  through her follow-up visit with me on 05/28/2020.    Bilateral lower extremity edema Beyond baseline Etiology possibly -while acute congestive heart failure is possible due to recent hospitalization for sepsis and intravenous volume resuscitation - other possible etiologies of bilateral lower extremity edema include protein calorie malnutrition or nephrotic range proteinuria leading to substantial hypoalbuminemia, decreased oncotic pressure and third spacing of the lower extremities. bnp mildly elevated Given a trial  trial of Lasix 20 mg IV despite having a mild lactic acidosis with improvement. LE edema resolved now. Obtaining urinalysis with spot protein creatinine ratio to identify quantity of proteinuria Echo with normal EF and Left ventricular diastolic parameters  are indeterminate Vascular LE [place] neg. dvt     Lactic acidosis Lactic acidosis of 3.0 , improved now, today 1.9 Patient was volume overloaded. See above Continue to treat underlying infection/pelvic abscess with intravenous antibiotic therapy.      Essential hypertension Continue home regimen of antihypertensive therapy   Dermatomyositis ([place]) Longstanding known history of dermatomyositis Patient follows with [facility] rheumatology Continue home  regimen of prednisone 10 mg daily and CellCept 1000 mg twice daily. Patient is immunocompromise due to this immunomodulating regimen   Hypoalbuminemia Please see assessment and plan above  Obtaining urinalysis  Obtaining spot protein and creatinine ratio  CT abdomen pelvis reveals no evidence of hepatic cirrhosis  Providing patient with nutritional supplements , dietition input was appreicated-see note     Mixed hyperlipidemia Continue home regimen of statin therapy    GERD without esophagitis  Continue home regimen of H2 blocker  Hypomagnesemia- Replaced and resolved, with mg 1.9 today Monitor levels   DVT prophylaxis: Lovenox Code Status: Full Family Communication: None at bedside Disposition Plan: Back to previous home life Barrier: Currently medically unstable for discharge.  Needs IV antibiotics,w/u pending. Anticipated d/c: in am if no plans for surgical intervention HH ordered.      LOS: 2 days   Time spent: 27min with >50% on coc    Nolberto Hanlon, MD [facility] Pager [phone]  If 7PM-7AM, please contact night-coverage [email or web address] Password [facility] 05/07/2020, 2:14 PM

## 2020-05-07 NOTE — Consult Note (Signed)
Reason for Consult:recent drainage of possible pelvic abscess Referring Physician: Nolberto Hanlon, MD  Haley Johnson is an 74 y.o. female. Many years postmenopausal, admitted with low back pain and was discharged from [facility] last week. She was taking antibiotics after a small pelvic fluid collection was drained by IR there. Records reviewed in care everywhere state that the fluid had no WBC and negative growth. She has degenerative changes in her spine and low back pain and nerve pain, no fever.  Pertinent Gynecological History: Menses: post-menopausal No obstetric history on file.   Menstrual History:  No LMP recorded.    Past Medical History:  Diagnosis Date  . Asthma   . Dermatomyositis ([place]) 05/05/2020  . Essential hypertension 05/05/2020  . GERD (gastroesophageal reflux disease)   . GERD without esophagitis 05/05/2020  . Hypercholesteremia   . Hypertension     History reviewed. No pertinent surgical history.  Family History  Family history unknown: Yes    Social History:  reports that she has never smoked. She has never used smokeless tobacco. No history on file for alcohol use and drug use.  Allergies:  Allergies  Allergen Reactions  . Chocolate Swelling    Medications: I have reviewed the patient's current medications.  Review of Systems  Constitutional: Negative.   Respiratory: Negative.   Cardiovascular: Negative.   Gastrointestinal: Negative.   Genitourinary: Negative for dysuria, pelvic pain, vaginal bleeding and vaginal discharge.  Musculoskeletal: Positive for back pain and myalgias.    Blood pressure (!) 109/52, pulse (!) 44, temperature 99 F (37.2 C), temperature source Oral, resp. rate 16, height 5\' 2"  (1.575 m), weight 77.6 kg, SpO2 97 %. Physical Exam  Constitutional: She does not appear ill.  HENT:  Head: Normocephalic.  Cardiovascular: Normal rate.  Respiratory: Effort normal.  GI: Normal appearance. She exhibits no  distension.  Genitourinary:    Genitourinary Comments: deferred   Neurological: She is alert.  Psychiatric: Mood normal.    Results for orders placed or performed during the hospital encounter of 05/04/20 (from the past 48 hour(s))  Magnesium     Status: Abnormal   Collection Time: 05/06/20  6:07 AM  Result Value Ref Range   Magnesium 1.5 (L) 1.7 - 2.4 mg/dL    Comment: Performed at [facility] [address], [place], [place] [address]  CBC WITH DIFFERENTIAL     Status: Abnormal   Collection Time: 05/06/20  6:07 AM  Result Value Ref Range   WBC 7.1 4.0 - 10.5 K/uL   RBC 3.36 (L) 3.87 - 5.11 MIL/uL   Hemoglobin 10.4 (L) 12.0 - 15.0 g/dL   HCT 33.6 (L) 36 - 46 %   MCV 100.0 80.0 - 100.0 fL   MCH 31.0 26.0 - 34.0 pg   MCHC 31.0 30.0 - 36.0 g/dL   RDW 15.5 11.5 - 15.5 %   Platelets 165 150 - 400 K/uL   nRBC 0.0 0.0 - 0.2 %   Neutrophils Relative % 71 %   Neutro Abs 5.1 1.7 - 7.7 K/uL   Lymphocytes Relative 14 %   Lymphs Abs 1.0 0.7 - 4.0 K/uL   Monocytes Relative 11 %   Monocytes Absolute 0.8 0 - 1 K/uL   Eosinophils Relative 3 %   Eosinophils Absolute 0.2 0 - 0 K/uL   Basophils Relative 0 %   Basophils Absolute 0.0 0 - 0 K/uL   Immature Granulocytes 1 %   Abs Immature Granulocytes 0.04 0.00 - 0.07  K/uL    Comment: Performed at [facility], [place] [address], [place], [place] [address]  Comprehensive metabolic panel     Status: Abnormal   Collection Time: 05/06/20  6:07 AM  Result Value Ref Range   Sodium 134 (L) 135 - 145 mmol/L   Potassium 4.0 3.5 - 5.1 mmol/L   Chloride 100 98 - 111 mmol/L   CO2 25 22 - 32 mmol/L   Glucose, Bld 86 70 - 99 mg/dL    Comment: Glucose reference range applies only to samples taken after fasting for at least 8 hours.   BUN 13 8 - 23 mg/dL   Creatinine, Ser 0.86 0.44 - 1.00 mg/dL   Calcium 8.4 (L) 8.9 - 10.3 mg/dL   Total Protein 5.6 (L) 6.5 - 8.1 g/dL   Albumin 2.3 (L) 3.5 - 5.0 g/dL   AST 70 (H) 15 - 41 U/L   ALT 36 0 - 44  U/L   Alkaline Phosphatase 34 (L) 38 - 126 U/L   Total Bilirubin 0.8 0.3 - 1.2 mg/dL   GFR calc non Af Amer >60 >60 mL/min   GFR calc Af Amer >60 >60 mL/min   Anion gap 9 5 - 15    Comment: Performed at [facility] [address], [place], [place] [address]  Basic metabolic panel     Status: Abnormal   Collection Time: 05/07/20  6:09 AM  Result Value Ref Range   Sodium 135 135 - 145 mmol/L   Potassium 4.3 3.5 - 5.1 mmol/L   Chloride 101 98 - 111 mmol/L   CO2 23 22 - 32 mmol/L   Glucose, Bld 84 70 - 99 mg/dL    Comment: Glucose reference range applies only to samples taken after fasting for at least 8 hours.   BUN 9 8 - 23 mg/dL   Creatinine, Ser 0.77 0.44 - 1.00 mg/dL   Calcium 8.7 (L) 8.9 - 10.3 mg/dL   GFR calc non Af Amer >60 >60 mL/min   GFR calc Af Amer >60 >60 mL/min   Anion gap 11 5 - 15    Comment: Performed at [facility] [address], [place], [place] [address]  Magnesium     Status: None   Collection Time: 05/07/20  6:09 AM  Result Value Ref Range   Magnesium 1.9 1.7 - 2.4 mg/dL    Comment: Performed at [facility] [address] [place], [place] [address]  Brain natriuretic peptide     Status: Abnormal   Collection Time: 05/07/20  6:09 AM  Result Value Ref Range   B Natriuretic Peptide 139.4 (H) 0.0 - 100.0 pg/mL    Comment: Performed at [facility] [address] [place], [place] [address]  CLINICAL DATA:  Bilateral lower extremity edema.  EXAM: CT CHEST, ABDOMEN, AND PELVIS WITH CONTRAST  TECHNIQUE: Multidetector CT imaging of the chest, abdomen and pelvis was performed following the standard protocol during bolus administration of intravenous contrast.  CONTRAST:  184mL OMNIPAQUE IOHEXOL 300 MG/ML  SOLN  COMPARISON:  None.  FINDINGS: CT CHEST FINDINGS  Cardiovascular: Scattered coronary artery calcifications. Heart is normal size. Aorta is normal caliber. Retroesophageal right subclavian  artery.  Mediastinum/Nodes: No mediastinal, hilar, or axillary adenopathy. Trachea and esophagus are unremarkable.  Lungs/Pleura: Lungs are clear. No focal airspace opacities or suspicious nodules. No effusions.  Musculoskeletal: Chest wall soft tissues are unremarkable. No acute bony abnormality.  CT ABDOMEN PELVIS FINDINGS  Hepatobiliary: Diffuse fatty infiltration of the liver. No suspicious focal hepatic abnormality. Small cyst in the right hepatic lobe. Gallbladder unremarkable.  Pancreas: No focal abnormality or ductal dilatation.  Spleen: No focal abnormality.  Normal size.  Adrenals/Urinary Tract: No adrenal abnormality. No focal renal abnormality. No stones or hydronephrosis. Urinary bladder is unremarkable.  Stomach/Bowel: Stomach, large and small bowel grossly unremarkable.  Vascular/Lymphatic: Aortic atherosclerosis. No enlarged abdominal or pelvic lymph nodes.  Reproductive: Uterus is unremarkable. There is a 3.1 cm fluid collection seen in the cul-de-sac posterior to the uterus with enhancing rim. It is unclear if this is ovarian in origin or could represent a focal pelvic abscess.  Other: Small amount of free fluid in the pelvis.  No free air.  Musculoskeletal: No acute bony abnormality.  IMPRESSION: Scattered coronary artery calcifications.  Diffuse fatty infiltration of the liver.  3.1 cm fluid collection in the cul-de-sac between the uterus and sigmoid colon. This could be ovarian in origin or could reflect a pelvic abscess. Consider further evaluation with pelvic ultrasound.   Electronically Signed   By: Rolm Baptise M.D.   On: 05/05/2020 01:24 CLINICAL DATA:  Pelvic fluid collection.  EXAM: TRANSABDOMINAL ULTRASOUND OF PELVIS  TECHNIQUE: Transabdominal ultrasound examination of the pelvis was performed including evaluation of the uterus, ovaries, adnexal regions, and pelvic cul-de-sac.  COMPARISON:  CT from same  day.  FINDINGS: Uterus  Measurements: 8 x 3.4 x 4.5 cm = volume: 63 mL. No fibroids or other mass visualized.  Endometrium: The endometrium was poorly evaluated.  Right ovary  Not visualized  Left ovary  Not visualized  Other findings:  No abnormal free fluid.  IMPRESSION: 1. Very limited study. 2. Neither ovary was visualized. 3. The collection in the patient's pelvis identified on the prior CT was not visualized on this study. 4. Suboptimal evaluation of the endometrium on this transabdominal only examination.   Electronically Signed   By: Constance Holster M.D.   On: 05/05/2020 02:56 ECHOCARDIOGRAM COMPLETE  Result Date: 05/06/2020    ECHOCARDIOGRAM REPORT   Patient Name:   MAEBRY OBRIEN Date of Exam: 05/06/2020 Medical Rec #:  [id number]            Height:       62.0 in Accession #:    [id number]           Weight:       171.0 lb Date of Birth:  [birth year]            BSA:          1.789 m Patient Age:    63 years             BP:           155/73 mmHg Patient Gender: F                    HR:           76 bpm. Exam Location:  Inpatient Procedure: 2D Echo, Cardiac Doppler and Color Doppler Indications:    I50.23 Acute on chronic systolic (congestive) heart failure  History:        Patient has no prior history of Echocardiogram examinations.                 Risk Factors:Hypertension, Dyslipidemia and GERD.  Sonographer:    Jonelle Sidle Dance Referring Phys: [id number] [place]  1. Left ventricular ejection fraction, by estimation, is 65 to 70%. The left ventricle  has normal function. The left ventricle has no regional wall motion abnormalities. There is mild left ventricular hypertrophy. Left ventricular diastolic parameters are indeterminate.  2. Right ventricular systolic function is normal. The right ventricular size is normal. There is normal pulmonary artery systolic pressure. The estimated right ventricular systolic pressure is 85.2 mmHg.  3. The  mitral valve is normal in structure. No evidence of mitral valve regurgitation.  4. The aortic valve is tricuspid. Aortic valve regurgitation is not visualized. No aortic stenosis is present.  5. The inferior vena cava is normal in size with greater than 50% respiratory variability, suggesting right atrial pressure of 3 mmHg. FINDINGS  Left Ventricle: Left ventricular ejection fraction, by estimation, is 65 to 70%. The left ventricle has normal function. The left ventricle has no regional wall motion abnormalities. The left ventricular internal cavity size was normal in size. There is  mild left ventricular hypertrophy. Left ventricular diastolic parameters are indeterminate. Right Ventricle: The right ventricular size is normal. No increase in right ventricular wall thickness. Right ventricular systolic function is normal. There is normal pulmonary artery systolic pressure. The tricuspid regurgitant velocity is 2.27 m/s, and  with an assumed right atrial pressure of 3 mmHg, the estimated right ventricular systolic pressure is 77.8 mmHg. Left Atrium: Left atrial size was normal in size. Right Atrium: Right atrial size was normal in size. Pericardium: There is no evidence of pericardial effusion. Mitral Valve: The mitral valve is normal in structure. No evidence of mitral valve regurgitation. Tricuspid Valve: The tricuspid valve is normal in structure. Tricuspid valve regurgitation is trivial. Aortic Valve: The aortic valve is tricuspid. Aortic valve regurgitation is not visualized. No aortic stenosis is present. Pulmonic Valve: The pulmonic valve was not well visualized. Pulmonic valve regurgitation is not visualized. Aorta: The aortic root and ascending aorta are structurally normal, with no evidence of dilitation. Venous: The inferior vena cava is normal in size with greater than 50% respiratory variability, suggesting right atrial pressure of 3 mmHg. IAS/Shunts: The interatrial septum was not well visualized.   LEFT VENTRICLE PLAX 2D LVIDd:         3.40 cm  Diastology LVIDs:         2.50 cm  LV e' lateral:   6.85 cm/s LV PW:         1.20 cm  LV E/e' lateral: 16.8 LV IVS:        1.10 cm  LV e' medial:    8.16 cm/s LVOT diam:     1.90 cm  LV E/e' medial:  14.1 LV SV:         93 LV SV Index:   52 LVOT Area:     2.84 cm  RIGHT VENTRICLE             IVC RV Basal diam:  2.40 cm     IVC diam: 1.70 cm RV S prime:     18.80 cm/s TAPSE (M-mode): 2.5 cm LEFT ATRIUM             Index       RIGHT ATRIUM           Index LA diam:        3.60 cm 2.01 cm/m  RA Area:     14.80 cm LA Vol (A2C):   74.5 ml 41.65 ml/m RA Volume:   34.70 ml  19.40 ml/m LA Vol (A4C):   46.5 ml 26.00 ml/m LA Biplane Vol: 59.8 ml 33.43 ml/m  AORTIC  VALVE LVOT Vmax:   175.00 cm/s LVOT Vmean:  92.000 cm/s LVOT VTI:    0.327 m  AORTA Ao Root diam: 3.10 cm Ao Asc diam:  3.15 cm MITRAL VALVE                TRICUSPID VALVE MV Area (PHT): 2.87 cm     TR Peak grad:   20.6 mmHg MV Decel Time: 264 msec     TR Vmax:        227.00 cm/s MV E velocity: 115.00 cm/s MV A velocity: 109.00 cm/s  SHUNTS MV E/A ratio:  1.06         Systemic VTI:  0.33 m                             Systemic Diam: 1.90 cm Oswaldo Milian MD Electronically signed by Oswaldo Milian MD Signature Date/Time: 05/06/2020/9:29:07 PM    Final     Assessment/Plan: The small fluid collection seen on CT is not confirmed with pelvic US and the fluid was not purulent and cultures were negative. The etiology is not known but I do not recommend any specific f/u, only to complete the antibiotics as recommended by Dr. Megan Salon. Thank you for the consult.  Emeterio Reeve 05/07/2020

## 2020-05-07 NOTE — Progress Notes (Signed)
Physical Therapy Treatment Patient Details Name: Haley Johnson MRN: [id number] DOB: [birth year] Today's Date: 05/07/2020    History of Present Illness Patient is a 74 y/o female who presents with BLE swelling and weakness. Pt with pelvic abscess. PMh includes HTN, Dermatomyositis, HTN, DM.    PT Comments    Pt seated in recliner on arrival.  She appears uncomfortable in recliner.  Performed stand pivot from bed to bedside commode. Pt also progressed gt distance after toileting.  She continues to require min guard assistance at this time.  HHPT remains appropriate.  Pt does report sore bottom from frequent loose stools informed RN to request order for cream for her bottom as she reports the barrier cream is not helping much.     Follow Up Recommendations  Home health PT;Supervision for mobility/OOB     Equipment Recommendations  None recommended by PT    Recommendations for Other Services       Precautions / Restrictions Precautions Precautions: Fall    Mobility  Bed Mobility               General bed mobility comments: Pt seated in recliner on arrival.  Transfers Overall transfer level: Needs assistance Equipment used: Rolling walker (2 wheeled) Transfers: Sit to/from [facility] Sit to Stand: Min guard Stand pivot transfers: Min guard       General transfer comment: Min guard for safety. Stood from recliner, from [facility] x1. SPT bed to/from [facility].  Ambulation/Gait Ambulation/Gait assistance: Min guard Gait Distance (Feet): 85 Feet Assistive device: Rolling walker (2 wheeled) Gait Pattern/deviations: Step-through pattern;Decreased stride length;Decreased stance time - right;Antalgic Gait velocity: decreased   General Gait Details: Slow, unsteady gait esp with increased distance with RLE weakness at hip. Fatigues.   Stairs             Wheelchair Mobility    Modified Rankin (Stroke Patients Only)       Balance Overall balance  assessment: Needs assistance Sitting-balance support: Feet supported;No upper extremity supported Sitting balance-Leahy Scale: Good     Standing balance support: During functional activity Standing balance-Leahy Scale: Fair                              Cognition Arousal/Alertness: Awake/alert Behavior During Therapy: WFL for tasks assessed/performed Overall Cognitive Status: Within Functional Limits for tasks assessed                                 General Comments: question safety awareness/deficits?      Exercises      General Comments        Pertinent Vitals/Pain Pain Assessment: 0-10 Pain Location: right back, LE Pain Descriptors / Indicators: Sore Pain Intervention(s): Monitored during session;Repositioned    Home Living                      Prior Function            PT Goals (current goals can now be found in the care plan section) Acute Rehab PT Goals Patient Stated Goal: to get better and go home Potential to Achieve Goals: Good Progress towards PT goals: Progressing toward goals    Frequency    Min 3X/week      PT Plan Current plan remains appropriate    Co-evaluation  AM-PAC PT "6 Clicks" Mobility   Outcome Measure  Help needed turning from your back to your side while in a flat bed without using bedrails?: None Help needed moving from lying on your back to sitting on the side of a flat bed without using bedrails?: None Help needed moving to and from a bed to a chair (including a wheelchair)?: A Little Help needed standing up from a chair using your arms (e.g., wheelchair or bedside chair)?: A Little Help needed to walk in hospital room?: A Little Help needed climbing 3-5 steps with a railing? : A Little 6 Click Score: 20    End of Session Equipment Utilized During Treatment: Gait belt Activity Tolerance: Patient limited by fatigue Patient left: with call bell/phone within reach;in  chair Nurse Communication: Mobility status (need for gerhardts butt cream due to soreness on bottom) PT Visit Diagnosis: Pain;Muscle weakness (generalized) (M62.81);Difficulty in walking, not elsewhere classified (R26.2);Unsteadiness on feet (R26.81) Pain - Right/Left: Right Pain - part of body: Hip (back)     Time: 3419-6222 PT Time Calculation (min) (ACUTE ONLY): 27 min  Charges:  $Gait Training: 8-22 mins $Therapeutic Activity: 8-22 mins                     Erasmo Leventhal , PTA Acute Rehabilitation Services Pager [phone] Office [phone]     Daulton Harbaugh Eli Hose 05/07/2020, 4:48 PM

## 2020-05-07 NOTE — Consult Note (Signed)
[facility] Surgery Consult Note  Haley Johnson 1946-07-02  [id number].    Requesting MD: Kurtis Bushman Chief Complaint/Reason for Consult: Pelvic ?abscess HPI:  Patient is a 74 year old female who is admitted for bilateral lower extremity swelling and weakness. She also reports low back pain that radiates down her right leg. Patient has also experienced some nocturnal dyspnea and cough with frothy white sputum. Denies fever, chills, chest pain, SOB at rest, abdominal pain, n/v, urinary symptoms. She was recently discharged from [facility] in [place] after being found to have pelvic abscess and several month history of low back pain. She was discharge on PO Augmentin. Patient has PMH significant for HTN, HLD, GERD, T2DM, Hx of shingles infection, Low back pain with sciatica. NKDA. She has not had any past abdominal surgery. She denies alcohol, tobacco, or illicit drug use. She is retired and has 3 sons.   ROS: Review of Systems  Constitutional: Negative for chills and fever.  Respiratory: Negative for shortness of breath and wheezing.   Cardiovascular: Negative for chest pain and palpitations.  Gastrointestinal: Positive for diarrhea. Negative for abdominal pain, blood in stool, melena, nausea and vomiting.  Genitourinary: Negative for dysuria, frequency and urgency.  Musculoskeletal: Positive for back pain (with sciatica ).  Neurological: Negative for dizziness and headaches.  All other systems reviewed and are negative.   Family History  Family history unknown: Yes    Past Medical History:  Diagnosis Date  . Asthma   . Dermatomyositis ([place]) 05/05/2020  . Essential hypertension 05/05/2020  . GERD (gastroesophageal reflux disease)   . GERD without esophagitis 05/05/2020  . Hypercholesteremia   . Hypertension     History reviewed. No pertinent surgical history.  Social History:  reports that she has never smoked. She has never used smokeless tobacco. No history on file  for alcohol use and drug use.  Allergies:  Allergies  Allergen Reactions  . Chocolate Swelling    Medications Prior to Admission  Medication Sig Dispense Refill  . albuterol (VENTOLIN HFA) 108 (90 Base) MCG/ACT inhaler Inhale 1-2 puffs into the lungs every 6 (six) hours as needed for wheezing or shortness of breath.    Marland Kitchen amLODipine (NORVASC) 5 MG tablet Take 5 mg by mouth daily.    Marland Kitchen amoxicillin-clavulanate (AUGMENTIN) 875-125 MG tablet Take 1 tablet by mouth 2 (two) times daily.     . bisoprolol-hydrochlorothiazide (ZIAC) 2.5-6.25 MG tablet Take 1 tablet by mouth daily.    Marland Kitchen etodolac (LODINE) 400 MG tablet Take 400 mg by mouth 2 (two) times daily.    . famotidine (PEPCID) 40 MG tablet Take 40 mg by mouth daily.    Marland Kitchen gabapentin (NEURONTIN) 100 MG capsule Take 200 mg by mouth at bedtime.    . meloxicam (MOBIC) 15 MG tablet Take 15 mg by mouth daily.    . mycophenolate (CELLCEPT) 500 MG tablet Take 1,000 mg by mouth 2 (two) times daily.    . predniSONE (DELTASONE) 10 MG tablet Take 10 mg by mouth daily.    . rosuvastatin (CRESTOR) 40 MG tablet Take 40 mg by mouth daily.      Blood pressure 130/64, pulse 82, temperature 98.6 F (37 C), temperature source Oral, resp. rate 16, height 5\' 2"  (1.575 m), weight 77.6 kg, SpO2 96 %. Physical Exam:  General: pleasant, WD, overweight female who is laying in bed in NAD HEENT: Sclera are noninjected.  PERRL.  Ears and nose without any masses or lesions.  Mouth is pink and  moist Heart: regular, rate, and rhythm.  Normal s1,s2. No obvious murmurs, gallops, or rubs noted.  Palpable radial and pedal pulses bilaterally Lungs: CTAB, no wheezes, rhonchi, or rales noted.  Respiratory effort nonlabored Abd: soft, NT, ND, +BS, no masses, hernias, or organomegaly MS: all 4 extremities are symmetrical with no cyanosis, clubbing, or edema. Skin: warm and dry with no masses, lesions, or rashes Neuro: Cranial nerves 2-12 grossly intact, sensation is grossly  intact Psych: A&Ox3 with an appropriate affect.   Results for orders placed or performed during the hospital encounter of 05/04/20 (from the past 48 hour(s))  Magnesium     Status: Abnormal   Collection Time: 05/06/20  6:07 AM  Result Value Ref Range   Magnesium 1.5 (L) 1.7 - 2.4 mg/dL    Comment: Performed at [facility] [address], [place], [place] [address]  CBC WITH DIFFERENTIAL     Status: Abnormal   Collection Time: 05/06/20  6:07 AM  Result Value Ref Range   WBC 7.1 4.0 - 10.5 K/uL   RBC 3.36 (L) 3.87 - 5.11 MIL/uL   Hemoglobin 10.4 (L) 12.0 - 15.0 g/dL   HCT 33.6 (L) 36 - 46 %   MCV 100.0 80.0 - 100.0 fL   MCH 31.0 26.0 - 34.0 pg   MCHC 31.0 30.0 - 36.0 g/dL   RDW 15.5 11.5 - 15.5 %   Platelets 165 150 - 400 K/uL   nRBC 0.0 0.0 - 0.2 %   Neutrophils Relative % 71 %   Neutro Abs 5.1 1.7 - 7.7 K/uL   Lymphocytes Relative 14 %   Lymphs Abs 1.0 0.7 - 4.0 K/uL   Monocytes Relative 11 %   Monocytes Absolute 0.8 0 - 1 K/uL   Eosinophils Relative 3 %   Eosinophils Absolute 0.2 0 - 0 K/uL   Basophils Relative 0 %   Basophils Absolute 0.0 0 - 0 K/uL   Immature Granulocytes 1 %   Abs Immature Granulocytes 0.04 0.00 - 0.07 K/uL    Comment: Performed at [facility], [address] [place], [place] [address]  Comprehensive metabolic panel     Status: Abnormal   Collection Time: 05/06/20  6:07 AM  Result Value Ref Range   Sodium 134 (L) 135 - 145 mmol/L   Potassium 4.0 3.5 - 5.1 mmol/L   Chloride 100 98 - 111 mmol/L   CO2 25 22 - 32 mmol/L   Glucose, Bld 86 70 - 99 mg/dL    Comment: Glucose reference range applies only to samples taken after fasting for at least 8 hours.   BUN 13 8 - 23 mg/dL   Creatinine, Ser 0.86 0.44 - 1.00 mg/dL   Calcium 8.4 (L) 8.9 - 10.3 mg/dL   Total Protein 5.6 (L) 6.5 - 8.1 g/dL   Albumin 2.3 (L) 3.5 - 5.0 g/dL   AST 70 (H) 15 - 41 U/L   ALT 36 0 - 44 U/L   Alkaline Phosphatase 34 (L) 38 - 126 U/L   Total Bilirubin 0.8 0.3 -  1.2 mg/dL   GFR calc non Af Amer >60 >60 mL/min   GFR calc Af Amer >60 >60 mL/min   Anion gap 9 5 - 15    Comment: Performed at [facility] [address] [place], [place] [address]  Basic metabolic panel     Status: Abnormal   Collection Time: 05/07/20  6:09 AM  Result Value Ref Range  Sodium 135 135 - 145 mmol/L   Potassium 4.3 3.5 - 5.1 mmol/L   Chloride 101 98 - 111 mmol/L   CO2 23 22 - 32 mmol/L   Glucose, Bld 84 70 - 99 mg/dL    Comment: Glucose reference range applies only to samples taken after fasting for at least 8 hours.   BUN 9 8 - 23 mg/dL   Creatinine, Ser 0.77 0.44 - 1.00 mg/dL   Calcium 8.7 (L) 8.9 - 10.3 mg/dL   GFR calc non Af Amer >60 >60 mL/min   GFR calc Af Amer >60 >60 mL/min   Anion gap 11 5 - 15    Comment: Performed at [facility] [address] [place], [place] [address]  Magnesium     Status: None   Collection Time: 05/07/20  6:09 AM  Result Value Ref Range   Magnesium 1.9 1.7 - 2.4 mg/dL    Comment: Performed at [facility] [address] [place],  [address]  Brain natriuretic peptide     Status: Abnormal   Collection Time: 05/07/20  6:09 AM  Result Value Ref Range   B Natriuretic Peptide 139.4 (H) 0.0 - 100.0 pg/mL    Comment: Performed at [facility] [address], [place],  [address]   ECHOCARDIOGRAM COMPLETE  Result Date: 05/06/2020    ECHOCARDIOGRAM REPORT   Patient Name:   Haley Johnson Date of Exam: 05/06/2020 Medical Rec #:  [id number]            Height:       62.0 in Accession #:    [id number]           Weight:       171.0 lb Date of Birth:  1946-04-15            BSA:          1.789 m Patient Age:    63 years             BP:           155/73 mmHg Patient Gender: F                    HR:           76 bpm. Exam Location:  Inpatient Procedure: 2D Echo, Cardiac Doppler and Color Doppler Indications:    I50.23 Acute on chronic systolic (congestive) heart failure  History:        Patient has no  prior history of Echocardiogram examinations.                 Risk Factors:Hypertension, Dyslipidemia and GERD.  Sonographer:    Jonelle Sidle Dance Referring Phys: [id number] [place]  1. Left ventricular ejection fraction, by estimation, is 65 to 70%. The left ventricle has normal function. The left ventricle has no regional wall motion abnormalities. There is mild left ventricular hypertrophy. Left ventricular diastolic parameters are indeterminate.  2. Right ventricular systolic function is normal. The right ventricular size is normal. There is normal pulmonary artery systolic pressure. The estimated right ventricular systolic pressure is 96.2 mmHg.  3. The mitral valve is normal in structure. No evidence of mitral valve regurgitation.  4. The aortic valve is tricuspid. Aortic valve regurgitation is not visualized. No aortic stenosis is present.  5. The inferior vena cava is normal in size with greater than 50% respiratory variability, suggesting right atrial pressure of 3 mmHg. FINDINGS  Left Ventricle: Left ventricular ejection fraction, by estimation, is 65 to 70%. The left ventricle has normal function. The left ventricle has no regional wall motion abnormalities. The left ventricular internal cavity size was normal in size. There is  mild left ventricular hypertrophy. Left ventricular diastolic parameters are indeterminate. Right Ventricle: The right ventricular size is normal. No increase in right ventricular wall thickness. Right ventricular systolic function is normal. There is normal pulmonary artery systolic pressure. The tricuspid regurgitant velocity is 2.27 m/s, and  with an assumed right atrial pressure of 3 mmHg, the estimated right ventricular systolic pressure is 09.9 mmHg. Left Atrium: Left atrial size was normal in size. Right Atrium: Right atrial size was normal in size. Pericardium: There is no evidence of pericardial effusion. Mitral Valve: The mitral valve is normal in  structure. No evidence of mitral valve regurgitation. Tricuspid Valve: The tricuspid valve is normal in structure. Tricuspid valve regurgitation is trivial. Aortic Valve: The aortic valve is tricuspid. Aortic valve regurgitation is not visualized. No aortic stenosis is present. Pulmonic Valve: The pulmonic valve was not well visualized. Pulmonic valve regurgitation is not visualized. Aorta: The aortic root and ascending aorta are structurally normal, with no evidence of dilitation. Venous: The inferior vena cava is normal in size with greater than 50% respiratory variability, suggesting right atrial pressure of 3 mmHg. IAS/Shunts: The interatrial septum was not well visualized.  LEFT VENTRICLE PLAX 2D LVIDd:         3.40 cm  Diastology LVIDs:         2.50 cm  LV e' lateral:   6.85 cm/s LV PW:         1.20 cm  LV E/e' lateral: 16.8 LV IVS:        1.10 cm  LV e' medial:    8.16 cm/s LVOT diam:     1.90 cm  LV E/e' medial:  14.1 LV SV:         93 LV SV Index:   52 LVOT Area:     2.84 cm  RIGHT VENTRICLE             IVC RV Basal diam:  2.40 cm     IVC diam: 1.70 cm RV S prime:     18.80 cm/s TAPSE (M-mode): 2.5 cm LEFT ATRIUM             Index       RIGHT ATRIUM           Index LA diam:        3.60 cm 2.01 cm/m  RA Area:     14.80 cm LA Vol (A2C):   74.5 ml 41.65 ml/m RA Volume:   34.70 ml  19.40 ml/m LA Vol (A4C):   46.5 ml 26.00 ml/m LA Biplane Vol: 59.8 ml 33.43 ml/m  AORTIC VALVE LVOT Vmax:   175.00 cm/s LVOT Vmean:  92.000 cm/s LVOT VTI:    0.327 m  AORTA Ao Root diam: 3.10 cm Ao Asc diam:  3.15 cm MITRAL VALVE                TRICUSPID VALVE MV Area (PHT): 2.87 cm     TR Peak grad:   20.6 mmHg MV Decel Time: 264 msec     TR Vmax:        227.00 cm/s MV E velocity: 115.00 cm/s MV A velocity: 109.00 cm/s  SHUNTS MV E/A ratio:  1.06         Systemic  VTI:  0.33 m                             Systemic Diam: 1.90 cm Oswaldo Milian MD Electronically signed by Oswaldo Milian MD Signature Date/Time:  05/06/2020/9:29:07 PM    Final       Assessment/Plan HTN HLD GERD T2DM Dermatomyositis Hx of shingles infection Low back pain with sciatica  Pelvic fluid collection - Patient with benign abdominal exam and tolerating diet/having bowel function - this was noted during recent hospitalization at [facility] in [place] as well - CT 6/13 with 3.1 cm fluid collection in cul-de-sac between uterus and sigmoid - question of possible GYN origin vs other etiology - no active GI process seen  - transabdominal pelvic US did not see collection  - IR unable to drain or aspirate  - no role for acute surgical intervention at this time, general surgery will sign off at this time - recommend abx 10-14 days total  and would consider TV pelvic US and GYN consult  FEN: HH diet VTE: SCDs, lovenox ID: [email or web address] 6/13>>  Norm Parcel, [facility] Surgery 05/07/2020, 11:13 AM Please see Amion for pager number during day hours 7:00am-4:30pm

## 2020-05-07 NOTE — Progress Notes (Signed)
Patient ID: Haley Johnson, female   DOB: 04/26/1946, 74 y.o.   MRN: [id number]         [facility]ious [facility]  Date of Admission:  05/04/2020   Total days of antibiotics 13        Day 3 ceftriaxone        Day 3 metronidazole         ASSESSMENT: She she has improved following empiric therapy for her pelvic abscess with oral amoxicillin clavulanate prior to discharge..  I recommend converting back to oral amoxicillin clavulanate and treating for at least 3 more weeks through her follow-up visit with me on 05/28/2020.  I believe that her primary problem chronic right back pain is due to postherpetic neuralgia.  Recommend increasing the dose of her gabapentin.  PLAN: 1. Recommend discharge on oral amoxicillin clavulanate 2. Increasing gabapentin to 300 mg 3 times daily 3. Arranged follow-up in my clinic on 05/28/2020 4. I will sign off now  Principal Problem:   Pelvic abscess in female Active Problems:   Bilateral lower extremity edema   Lactic acidosis   Essential hypertension   GERD without esophagitis   Dermatomyositis ([facility])   Hypoalbuminemia   Mixed hyperlipidemia   Postherpetic neuralgia   Scheduled Meds: . amLODipine  5 mg Oral Daily  . bisoprolol-hydrochlorothiazide  1 tablet Oral Daily  . enoxaparin (LOVENOX) injection  40 mg Subcutaneous Q24H  . famotidine  20 mg Oral Daily  . feeding supplement (ENSURE ENLIVE)  237 mL Oral TID BM  . gabapentin  200 mg Oral QHS  . multivitamin with minerals  1 tablet Oral Daily  . mycophenolate  1,000 mg Oral BID  . predniSONE  10 mg Oral Daily  . rosuvastatin  40 mg Oral Daily   Continuous Infusions: . ceFEPime (MAXIPIME) IV 2 g (05/07/20 0535)  . metronidazole 500 mg (05/07/20 0401)   PRN Meds:.acetaminophen **OR** acetaminophen, albuterol, ondansetron **OR** ondansetron (ZOFRAN) IV, oxyCODONE-acetaminophen, polyethylene glycol   SUBJECTIVE: Her primary complaint remains her chronic right low back pain  that radiates down her right leg with.  Her recent fevers have resolved and she now has only minimal right lower quadrant pain related to her pelvic abscess.  Interventional radiology reviewed her CT scan again and did not feel that reaspiration was safe given its location.  Review of Systems: Review of Systems  Constitutional: Negative for chills, diaphoresis and fever.  Gastrointestinal: Positive for abdominal pain. Negative for diarrhea, nausea and vomiting.  Musculoskeletal: Positive for back pain.    Allergies  Allergen Reactions  . Chocolate Swelling    OBJECTIVE: Vitals:   05/05/20 2230 05/06/20 0504 05/06/20 1448 05/07/20 0404  BP: 138/69 (!) 155/73 118/68 130/64  Pulse: 85 86 84 82  Resp: 20 16 18 16   Temp: 99.2 F (37.3 C) 98.6 F (37 C) 98.5 F (36.9 C) 98.6 F (37 C)  TempSrc: Oral Oral Oral Oral  SpO2: 100% 100% 99% 96%  Weight:      Height:       Body mass index is 31.28 kg/m.  Physical Exam Constitutional:      Comments: She is very pleasant.  She is sitting up in a chair.  Cardiovascular:     Rate and Rhythm: Normal rate and regular rhythm.     Heart sounds: No murmur heard.   Pulmonary:     Effort: Pulmonary effort is normal.     Breath sounds: Normal breath sounds.  Abdominal:  General: There is no distension.     Palpations: Abdomen is soft.     Tenderness: There is abdominal tenderness.     Comments: Very mild tenderness with palpation in the right lower quadrant.  Psychiatric:        Mood and Affect: Mood normal.     Lab Results Lab Results  Component Value Date   WBC 7.1 05/06/2020   HGB 10.4 (L) 05/06/2020   HCT 33.6 (L) 05/06/2020   MCV 100.0 05/06/2020   PLT 165 05/06/2020    Lab Results  Component Value Date   CREATININE 0.77 05/07/2020   BUN 9 05/07/2020   NA 135 05/07/2020   K 4.3 05/07/2020   CL 101 05/07/2020   CO2 23 05/07/2020    Lab Results  Component Value Date   ALT 36 05/06/2020   AST 70 (H) 05/06/2020    ALKPHOS 34 (L) 05/06/2020   BILITOT 0.8 05/06/2020     Microbiology: Recent Results (from the past 240 hour(s))  Blood culture (routine x 2)     Status: None (Preliminary result)   Collection Time: 05/05/20 12:37 AM   Specimen: BLOOD  Result Value Ref Range Status   Specimen Description BLOOD LEFT ANTECUBITAL  Final   Special Requests   Final    BOTTLES DRAWN AEROBIC AND ANAEROBIC Blood Culture results may not be optimal due to an excessive volume of blood received in culture bottles   Culture   Final    NO GROWTH 2 DAYS Performed at [facility], [place] [address] [place], [place] [address]    Report Status PENDING  Incomplete  Blood culture (routine x 2)     Status: None (Preliminary result)   Collection Time: 05/05/20 12:52 AM   Specimen: BLOOD RIGHT ARM  Result Value Ref Range Status   Specimen Description BLOOD RIGHT ARM  Final   Special Requests   Final    BOTTLES DRAWN AEROBIC AND ANAEROBIC Blood Culture adequate volume   Culture   Final    NO GROWTH 2 DAYS Performed at [facility], [place] [address] [place], [place] [address]    Report Status PENDING  Incomplete  SARS Coronavirus 2 by RT PCR (hospital order, performed in [facility] hospital lab) Nasopharyngeal Nasopharyngeal Swab     Status: None   Collection Time: 05/05/20  4:34 AM   Specimen: Nasopharyngeal Swab  Result Value Ref Range Status   SARS Coronavirus 2 NEGATIVE NEGATIVE Final    Comment: (NOTE) SARS-CoV-2 target nucleic acids are NOT DETECTED.  The SARS-CoV-2 RNA is generally detectable in upper and lower respiratory specimens during the acute phase of infection. The lowest concentration of SARS-CoV-2 viral copies this assay can detect is 250 copies / mL. A negative result does not preclude SARS-CoV-2 infection and should not be used as the sole basis for treatment or other patient management decisions.  A negative result may occur with improper specimen collection / handling, submission of  specimen other than nasopharyngeal swab, presence of viral mutation(s) within the areas targeted by this assay, and inadequate number of viral copies (<250 copies / mL). A negative result must be combined with clinical observations, patient history, and epidemiological information.  Fact Sheet for Patients:   StrictlyIdeas.no  Fact Sheet for Healthcare Providers: BankingDealers.co.za  This test is not yet approved or  cleared by the [place] FDA and has been authorized for detection and/or diagnosis of SARS-CoV-2 by FDA under an Emergency Use Authorization (EUA).  This EUA will remain in effect (meaning this test can be used) for the duration of the COVID-19 declaration under Section 564(b)(1) of the Act, 21 U.S.C. section 360bbb-3(b)(1), unless the authorization is terminated or revoked sooner.  Performed at [facility], [place] [address], [place], [place] [address]     Michel Bickers, [facility] [phone] pager   [phone] cell 05/07/2020, 12:38 PM

## 2020-05-08 DIAGNOSIS — L8990 Pressure ulcer of unspecified site, unspecified stage: Secondary | ICD-10-CM | POA: Insufficient documentation

## 2020-05-08 MED ORDER — GABAPENTIN 300 MG PO CAPS: 300.0000 mg | ORAL_CAPSULE | Freq: Three times a day (TID) | ORAL | 2 refills | Status: DC

## 2020-05-08 MED ORDER — OXYCODONE-ACETAMINOPHEN 5-325 MG PO TABS: 1.0000 | ORAL_TABLET | Freq: Four times a day (QID) | ORAL | 0 refills | Status: DC | PRN

## 2020-05-08 NOTE — Care Management Important Message (Signed)
Important Message  Patient Details  Name: Haley Johnson MRN: [id number] Date of Birth: 09-09-1946   Medicare Important Message Given:  Yes     Orbie Pyo 05/08/2020, 3:11 PM

## 2020-05-08 NOTE — Discharge Summary (Signed)
Physician Discharge Summary  Haley Johnson YIR:[id number] DOB: 1946/08/10 DOA: 05/04/2020  PCP: Patient, No Pcp Per  Admit date: 05/04/2020 Discharge date: 05/08/2020  Admitted From: Home  Discharge disposition: Home with Home health   Recommendations for Outpatient Follow-Up:   . Follow up with your primary care provider as has been scheduled on 05/14/2020. Marland Kitchen Check CBC, BMP,Magnesium in the next visit . Follow-up with Dr. Megan Salon infectious disease on 05/28/2020. Patient will need outpatient scanning/imaging to assess for pelvic abscess.  Discharge Diagnosis:   Principal Problem:   Pelvic abscess in female Active Problems:   Bilateral lower extremity edema   Lactic acidosis   Essential hypertension   GERD without esophagitis   Dermatomyositis (HCC)   Hypoalbuminemia   Mixed hyperlipidemia   Postherpetic neuralgia   Pressure injury of skin  Discharge Condition: Improved.  Diet recommendation: Low sodium, heart healthy.   Code status: Full.   History of Present Illness:   74 year old female with past medical history of hypertension, gastroesophageal reflux disease, hyperlipidemia, diabetes mellitus(patient disputes this),dermatomyositis, gastroesophageal reflux disease and lymphedema presented to [facility] emergency department with complaints of bilateral lower extremity swelling and lower extremity weakness. Of note, patient was recently discharged from [facility] in [place] after she was found to have a pelvic abscess after presenting with a several month history of progressive low back pain. Discharged on a home-going regimen of oral Augmentin on 04/28/2020.   Hospital Course:   Following conditions were addressed during hospitalization as listed below,  Pelvic abscess  During hospitalization patient was continued on IV cefepime, metronidazole. Infectious disease was consulted interventional radiology was consulted for possible  aspiration but radiology recommended gynecology evaluation. Gynecology recommend surgical intervention and recommended oral antibiotics. Patient was then changed to amoxicillin clavulanate for at least 3 more weeks as per ID recommendation until follow-up visit on 05/28/2020.  Bilateral lower extremity edema Improved with Lasix. 2D echocardiogram with normal LV function. Ultrasound of the lower extremities was negative. History of lymphedema.  Lactic acidosis Lactic acidosis of 3.0 , presentation. Improved to 1.9.  Essential hypertension Continue amlodipine, Ziac on discharge  History of dermatomyositis. Follow up with [facility] rheumatology. Continue prednisone and CellCept on discharge.  Hypoalbuminemia CT abdomen pelvis reveals no evidence of hepatic cirrhosis. Seen by nutritionist during hospitalization  Mixed hyperlipidemia Continue Crestor  GERD without esophagitis Continue home regimen of H2 blocker  Disposition.  At this time, patient is stable for disposition home with outpatient follow-up with primary care physician and infectious disease  Medical Consultants:    General surgery  Obstetric and gynecology  Infectious disease  Interventional radiology  Procedures:    None Subjective:   Today, patient feels okay.  Has mild pelvic pain with some diarrhea.  Denies fever chills or rigor.  Discharge Exam:   Vitals:   05/07/20 2129 05/08/20 0531  BP: 138/63 131/68  Pulse: 93 88  Resp: 17 14  Temp: 98.8 F (37.1 C) 98.4 F (36.9 C)  SpO2: 100% 100%   Vitals:   05/07/20 0404 05/07/20 1541 05/07/20 2129 05/08/20 0531  BP: 130/64 (!) 109/52 138/63 131/68  Pulse: 82 (!) 44 93 88  Resp: 16 16 17 14   Temp: 98.6 F (37 C) 99 F (37.2 C) 98.8 F (37.1 C) 98.4 F (36.9 C)  TempSrc: Oral Oral Oral Oral  SpO2: 96% 97% 100% 100%  Weight:      Height:       General: Alert awake, not in obvious  distress HENT: pupils equally reacting to light,  No  scleral pallor or icterus noted. Oral mucosa is moist.  Chest:  Clear breath sounds.  Diminished breath sounds bilaterally. No crackles or wheezes.  CVS: S1 &S2 heard. No murmur.  Regular rate and rhythm. Abdomen: Soft, nontender, nondistended.  Bowel sounds are heard.   Extremities: No cyanosis, clubbing or edema.  Peripheral pulses are palpable. Psych: Alert, awake and oriented, normal mood CNS:  No cranial nerve deficits.  Power equal in all extremities.   Skin: Warm and dry.  No rashes noted.  The results of significant diagnostics from this hospitalization (including imaging, microbiology, ancillary and laboratory) are listed below for reference.     Diagnostic Studies:   CT Chest W Contrast  Result Date: 05/05/2020 CLINICAL DATA:  Bilateral lower extremity edema. EXAM: CT CHEST, ABDOMEN, AND PELVIS WITH CONTRAST TECHNIQUE: Multidetector CT imaging of the chest, abdomen and pelvis was performed following the standard protocol during bolus administration of intravenous contrast. CONTRAST:  170mL OMNIPAQUE IOHEXOL 300 MG/ML  SOLN COMPARISON:  None. FINDINGS: CT CHEST FINDINGS Cardiovascular: Scattered coronary artery calcifications. Heart is normal size. Aorta is normal caliber. Retroesophageal right subclavian artery. Mediastinum/Nodes: No mediastinal, hilar, or axillary adenopathy. Trachea and esophagus are unremarkable. Lungs/Pleura: Lungs are clear. No focal airspace opacities or suspicious nodules. No effusions. Musculoskeletal: Chest wall soft tissues are unremarkable. No acute bony abnormality. CT ABDOMEN PELVIS FINDINGS Hepatobiliary: Diffuse fatty infiltration of the liver. No suspicious focal hepatic abnormality. Small cyst in the right hepatic lobe. Gallbladder unremarkable. Pancreas: No focal abnormality or ductal dilatation. Spleen: No focal abnormality.  Normal size. Adrenals/Urinary Tract: No adrenal abnormality. No focal renal abnormality. No stones or hydronephrosis. Urinary  bladder is unremarkable. Stomach/Bowel: Stomach, large and small bowel grossly unremarkable. Vascular/Lymphatic: Aortic atherosclerosis. No enlarged abdominal or pelvic lymph nodes. Reproductive: Uterus is unremarkable. There is a 3.1 cm fluid collection seen in the cul-de-sac posterior to the uterus with enhancing rim. It is unclear if this is ovarian in origin or could represent a focal pelvic abscess. Other: Small amount of free fluid in the pelvis.  No free air. Musculoskeletal: No acute bony abnormality. IMPRESSION: Scattered coronary artery calcifications. Diffuse fatty infiltration of the liver. 3.1 cm fluid collection in the cul-de-sac between the uterus and sigmoid colon. This could be ovarian in origin or could reflect a pelvic abscess. Consider further evaluation with pelvic ultrasound. Electronically Signed   By: Rolm Baptise M.D.   On: 05/05/2020 01:24   US Pelvis Complete  Result Date: 05/05/2020 CLINICAL DATA:  Pelvic fluid collection. EXAM: TRANSABDOMINAL ULTRASOUND OF PELVIS TECHNIQUE: Transabdominal ultrasound examination of the pelvis was performed including evaluation of the uterus, ovaries, adnexal regions, and pelvic cul-de-sac. COMPARISON:  CT from same day. FINDINGS: Uterus Measurements: 8 x 3.4 x 4.5 cm = volume: 63 mL. No fibroids or other mass visualized. Endometrium: The endometrium was poorly evaluated. Right ovary Not visualized Left ovary Not visualized Other findings:  No abnormal free fluid. IMPRESSION: 1. Very limited study. 2. Neither ovary was visualized. 3. The collection in the patient's pelvis identified on the prior CT was not visualized on this study. 4. Suboptimal evaluation of the endometrium on this transabdominal only examination. Electronically Signed   By: Constance Holster M.D.   On: 05/05/2020 02:56   CT ABDOMEN PELVIS W CONTRAST  Result Date: 05/05/2020 CLINICAL DATA:  Bilateral lower extremity edema. EXAM: CT CHEST, ABDOMEN, AND PELVIS WITH CONTRAST  TECHNIQUE: Multidetector CT imaging of the chest, abdomen  and pelvis was performed following the standard protocol during bolus administration of intravenous contrast. CONTRAST:  152mL OMNIPAQUE IOHEXOL 300 MG/ML  SOLN COMPARISON:  None. FINDINGS: CT CHEST FINDINGS Cardiovascular: Scattered coronary artery calcifications. Heart is normal size. Aorta is normal caliber. Retroesophageal right subclavian artery. Mediastinum/Nodes: No mediastinal, hilar, or axillary adenopathy. Trachea and esophagus are unremarkable. Lungs/Pleura: Lungs are clear. No focal airspace opacities or suspicious nodules. No effusions. Musculoskeletal: Chest wall soft tissues are unremarkable. No acute bony abnormality. CT ABDOMEN PELVIS FINDINGS Hepatobiliary: Diffuse fatty infiltration of the liver. No suspicious focal hepatic abnormality. Small cyst in the right hepatic lobe. Gallbladder unremarkable. Pancreas: No focal abnormality or ductal dilatation. Spleen: No focal abnormality.  Normal size. Adrenals/Urinary Tract: No adrenal abnormality. No focal renal abnormality. No stones or hydronephrosis. Urinary bladder is unremarkable. Stomach/Bowel: Stomach, large and small bowel grossly unremarkable. Vascular/Lymphatic: Aortic atherosclerosis. No enlarged abdominal or pelvic lymph nodes. Reproductive: Uterus is unremarkable. There is a 3.1 cm fluid collection seen in the cul-de-sac posterior to the uterus with enhancing rim. It is unclear if this is ovarian in origin or could represent a focal pelvic abscess. Other: Small amount of free fluid in the pelvis.  No free air. Musculoskeletal: No acute bony abnormality. IMPRESSION: Scattered coronary artery calcifications. Diffuse fatty infiltration of the liver. 3.1 cm fluid collection in the cul-de-sac between the uterus and sigmoid colon. This could be ovarian in origin or could reflect a pelvic abscess. Consider further evaluation with pelvic ultrasound. Electronically Signed   By: Rolm Baptise  M.D.   On: 05/05/2020 01:24   DG Chest Portable 1 View  Result Date: 05/05/2020 CLINICAL DATA:  Shortness of breath. EXAM: PORTABLE CHEST 1 VIEW COMPARISON:  None. FINDINGS: The heart size is borderline enlarged. There is no pneumothorax or large pleural effusion. No focal infiltrate. There is a small density in the left perihilar region measuring approximately 7 mm and projecting over the posterior seventh rib on the left. IMPRESSION: 1. No acute cardiopulmonary process. 2. Small 7 mm density in the left perihilar region. A 4-6 week follow-up two-view chest x-ray is recommended to confirm resolution of this finding. Electronically Signed   By: Constance Holster M.D.   On: 05/05/2020 00:15   VAS [place] LOWER EXTREMITY VENOUS (DVT) (ONLY [facility] & [facility])  Result Date: 05/05/2020  Lower Venous DVT Study Indications: Edema.  Risk Factors: Pelvic abscesss. Limitations: Significant edema. Comparison Study: No prior study on file for comparison Performing Technologist: Sharion Dove RVS  Examination Guidelines: A complete evaluation includes B-mode imaging, spectral Doppler, color Doppler, and power Doppler as needed of all accessible portions of each vessel. Bilateral testing is considered an integral part of a complete examination. Limited examinations for reoccurring indications may be performed as noted. The reflux portion of the exam is performed with the patient in reverse Trendelenburg.  +---------+---------------+---------+-----------+----------+--------------+ RIGHT    CompressibilityPhasicitySpontaneityPropertiesThrombus Aging +---------+---------------+---------+-----------+----------+--------------+ CFV      Full           Yes      Yes                                 +---------+---------------+---------+-----------+----------+--------------+ SFJ      Full                                                        +---------+---------------+---------+-----------+----------+--------------+  FV  Prox  Full                                                        +---------+---------------+---------+-----------+----------+--------------+ FV Mid   Full                                                        +---------+---------------+---------+-----------+----------+--------------+ FV DistalFull                                                        +---------+---------------+---------+-----------+----------+--------------+ PFV      Full                                                        +---------+---------------+---------+-----------+----------+--------------+ POP      Full           Yes      Yes                                 +---------+---------------+---------+-----------+----------+--------------+ PTV      Full                                                        +---------+---------------+---------+-----------+----------+--------------+ PERO     Full                                                        +---------+---------------+---------+-----------+----------+--------------+   +---------+---------------+---------+-----------+----------+---------------+ LEFT     CompressibilityPhasicitySpontaneityPropertiesThrombus Aging  +---------+---------------+---------+-----------+----------+---------------+ CFV      Full           Yes      Yes                                  +---------+---------------+---------+-----------+----------+---------------+ SFJ      Full                                                         +---------+---------------+---------+-----------+----------+---------------+ FV Prox  Full                                                         +---------+---------------+---------+-----------+----------+---------------+  FV Mid   Full                                                         +---------+---------------+---------+-----------+----------+---------------+ FV DistalFull                                                          +---------+---------------+---------+-----------+----------+---------------+ PFV      Full                                                         +---------+---------------+---------+-----------+----------+---------------+ POP      Full           Yes      Yes                                  +---------+---------------+---------+-----------+----------+---------------+ PTV                                                   patent by color +---------+---------------+---------+-----------+----------+---------------+ PERO                                                  patent by color +---------+---------------+---------+-----------+----------+---------------+     Summary: RIGHT: - There is no evidence of deep vein thrombosis in the lower extremity.  LEFT: - There is no evidence of deep vein thrombosis in the lower extremity. However, portions of this examination were limited- see technologist comments above.  *See table(s) above for measurements and observations. Electronically signed by Monica Martinez MD on 05/05/2020 at 11:15:00 AM.    Final      Labs:   Basic Metabolic Panel: Recent Labs  Lab 05/04/20 1825 05/04/20 1825 05/06/20 0607 05/07/20 0609  NA 136  --  134* 135  K 4.3   < > 4.0 4.3  CL 103  --  100 101  CO2 25  --  25 23  GLUCOSE 85  --  86 84  BUN 15  --  13 9  CREATININE 1.08*  --  0.86 0.77  CALCIUM 8.7*  --  8.4* 8.7*  MG  --   --  1.5* 1.9   < > = values in this interval not displayed.   GFR Estimated Creatinine Clearance: 61.3 mL/min (by C-G formula based on SCr of 0.77 mg/dL). Liver Function Tests: Recent Labs  Lab 05/04/20 2346 05/06/20 0607  AST 78* 70*  ALT 43 36  ALKPHOS 36* 34*  BILITOT 0.9 0.8  PROT 6.1* 5.6*  ALBUMIN 2.6* 2.3*   Recent Labs  Lab 05/04/20 2346  LIPASE 20   No results for input(s): AMMONIA  in the last 168 hours. Coagulation profile Recent Labs  Lab 05/05/20 0338  INR  1.2    CBC: Recent Labs  Lab 05/04/20 1825 05/06/20 0607  WBC 7.5 7.1  NEUTROABS  --  5.1  HGB 9.9* 10.4*  HCT 31.8* 33.6*  MCV 98.5 100.0  PLT 144* 165   Cardiac Enzymes: No results for input(s): CKTOTAL, CKMB, CKMBINDEX, TROPONINI in the last 168 hours. BNP: Invalid input(s): POCBNP CBG: No results for input(s): GLUCAP in the last 168 hours. D-Dimer No results for input(s): DDIMER in the last 72 hours. Hgb A1c No results for input(s): HGBA1C in the last 72 hours. Lipid Profile No results for input(s): CHOL, HDL, LDLCALC, TRIG, CHOLHDL, LDLDIRECT in the last 72 hours. Thyroid function studies No results for input(s): TSH, T4TOTAL, T3FREE, THYROIDAB in the last 72 hours.  Invalid input(s): FREET3 Anemia work up No results for input(s): VITAMINB12, FOLATE, FERRITIN, TIBC, IRON, RETICCTPCT in the last 72 hours. Microbiology Recent Results (from the past 240 hour(s))  Blood culture (routine x 2)     Status: None (Preliminary result)   Collection Time: 05/05/20 12:37 AM   Specimen: BLOOD  Result Value Ref Range Status   Specimen Description BLOOD LEFT ANTECUBITAL  Final   Special Requests   Final    BOTTLES DRAWN AEROBIC AND ANAEROBIC Blood Culture results may not be optimal due to an excessive volume of blood received in culture bottles   Culture   Final    NO GROWTH 2 DAYS Performed at [facility] [address], [place], [place] [address]    Report Status PENDING  Incomplete  Blood culture (routine x 2)     Status: None (Preliminary result)   Collection Time: 05/05/20 12:52 AM   Specimen: BLOOD RIGHT ARM  Result Value Ref Range Status   Specimen Description BLOOD RIGHT ARM  Final   Special Requests   Final    BOTTLES DRAWN AEROBIC AND ANAEROBIC Blood Culture adequate volume   Culture   Final    NO GROWTH 2 DAYS Performed at [facility], [place] [address], [place], [place] [address]    Report Status PENDING  Incomplete  SARS Coronavirus 2 by RT PCR  (hospital order, performed in [facility] hospital lab) Nasopharyngeal Nasopharyngeal Swab     Status: None   Collection Time: 05/05/20  4:34 AM   Specimen: Nasopharyngeal Swab  Result Value Ref Range Status   SARS Coronavirus 2 NEGATIVE NEGATIVE Final    Comment: (NOTE) SARS-CoV-2 target nucleic acids are NOT DETECTED.  The SARS-CoV-2 RNA is generally detectable in upper and lower respiratory specimens during the acute phase of infection. The lowest concentration of SARS-CoV-2 viral copies this assay can detect is 250 copies / mL. A negative result does not preclude SARS-CoV-2 infection and should not be used as the sole basis for treatment or other patient management decisions.  A negative result may occur with improper specimen collection / handling, submission of specimen other than nasopharyngeal swab, presence of viral mutation(s) within the areas targeted by this assay, and inadequate number of viral copies (<250 copies / mL). A negative result must be combined with clinical observations, patient history, and epidemiological information.  Fact Sheet for Patients:   StrictlyIdeas.no  Fact Sheet for Healthcare Providers: BankingDealers.co.za  This test is not yet approved or  cleared by the [place] FDA and has been authorized for detection and/or diagnosis of SARS-CoV-2 by FDA under an Emergency Use Authorization (EUA).  This EUA will remain in effect (meaning this test can be used) for the duration of the COVID-19 declaration under Section 564(b)(1) of the Act, 21 U.S.C. section 360bbb-3(b)(1), unless the authorization is terminated or revoked sooner.  Performed at [facility], [place] [address], [place], [place] [address]      Discharge Instructions:   Discharge Instructions    Call MD for:  persistant nausea and vomiting   Complete by: As directed    Call MD for:  severe uncontrolled pain   Complete by: As  directed    Call MD for:  temperature >100.4   Complete by: As directed    Diet - low sodium heart healthy   Complete by: As directed    Discharge instructions   Complete by: As directed    Follow-up with your primary care physician in 1 to 2 weeks.  Continue the course of antibiotic as prescribed.   Please follow-up with Dr. Bridget Hartshorn at [facility] on 05/28/2020.  Your dose of gabapentin has been increased.   Discharge wound care:   Complete by: As directed    Local wound care   Increase activity slowly   Complete by: As directed      Allergies as of 05/08/2020      Reactions   Chocolate Swelling      Medication List    TAKE these medications   albuterol 108 (90 Base) MCG/ACT inhaler Commonly known as: VENTOLIN HFA Inhale 1-2 puffs into the lungs every 6 (six) hours as needed for wheezing or shortness of breath.   amLODipine 5 MG tablet Commonly known as: NORVASC Take 5 mg by mouth daily.   amoxicillin-clavulanate 875-125 MG tablet Commonly known as: AUGMENTIN Take 1 tablet by mouth every 12 (twelve) hours. What changed: when to take this Notes to patient: **RESUME** This antibiotic (new prescription sent) and take twice daily until all gone to treat infection   bisoprolol-hydrochlorothiazide 2.5-6.25 MG tablet Commonly known as: ZIAC Take 1 tablet by mouth daily.   etodolac 400 MG tablet Commonly known as: LODINE Take 400 mg by mouth 2 (two) times daily.   famotidine 40 MG tablet Commonly known as: PEPCID Take 40 mg by mouth daily.   gabapentin 300 MG capsule Commonly known as: NEURONTIN Take 1 capsule (300 mg total) by mouth 3 (three) times daily. What changed:   medication strength  how much to take  when to take this   lactobacillus acidophilus Tabs tablet Take 2 tablets by mouth 3 (three) times daily.   meloxicam 15 MG tablet Commonly known as: MOBIC Take 15 mg by mouth daily.   mycophenolate 500 MG tablet Commonly known as: CELLCEPT Take 1,000 mg  by mouth 2 (two) times daily.   oxyCODONE-acetaminophen 5-325 MG tablet Commonly known as: PERCOCET/ROXICET Take 1 tablet by mouth every 6 (six) hours as needed for moderate pain or severe pain.   predniSONE 10 MG tablet Commonly known as: DELTASONE Take 10 mg by mouth daily.   rosuvastatin 40 MG tablet Commonly known as: CRESTOR Take 40 mg by mouth daily.            Discharge Care Instructions  (From admission, onward)         Start     Ordered   05/08/20 0000  Discharge wound care:       Comments: Local wound care   05/08/20 0955          Follow-up Information    Mathews Argyle, MD  Follow up.   Specialty: Family Medicine Why: May 14, 2020 at 1140 am  Contact information: [place] [address] [phone]        Michel Bickers, MD. Go on 05/28/2021.   Specialty: Infectious Diseases Why: for pelvic abscess followup Contact information: [address] [place] [address] [phone]               Time coordinating discharge: 39 minutes  Signed:  Deashia Soule  [facility] 05/08/2020, 10:03 AM

## 2020-05-10 LAB — CULTURE, BLOOD (ROUTINE X 2)
Culture: NO GROWTH
Culture: NO GROWTH
Special Requests: ADEQUATE

## 2020-05-28 ENCOUNTER — Ambulatory Visit: Payer: Medicare PPO | Admitting: Internal Medicine

## 2020-05-30 ENCOUNTER — Other Ambulatory Visit: Payer: Self-pay

## 2020-05-30 ENCOUNTER — Encounter: Payer: Self-pay | Admitting: Internal Medicine

## 2020-05-30 ENCOUNTER — Ambulatory Visit (INDEPENDENT_AMBULATORY_CARE_PROVIDER_SITE_OTHER): Payer: Medicare PPO | Admitting: Internal Medicine

## 2020-05-30 DIAGNOSIS — N739 Female pelvic inflammatory disease, unspecified: Secondary | ICD-10-CM

## 2020-05-30 DIAGNOSIS — R5381 Other malaise: Secondary | ICD-10-CM

## 2020-05-30 DIAGNOSIS — R197 Diarrhea, unspecified: Secondary | ICD-10-CM | POA: Diagnosis not present

## 2020-05-30 DIAGNOSIS — B0229 Other postherpetic nervous system involvement: Secondary | ICD-10-CM

## 2020-05-30 MED ORDER — PREGABALIN 50 MG PO CAPS: 50.0000 mg | ORAL_CAPSULE | Freq: Three times a day (TID) | ORAL | 5 refills | Status: DC

## 2020-05-30 NOTE — Assessment & Plan Note (Addendum)
I suspect she has been treated adequately to resolve her abscess.  Have her stop amoxicillin clavulanate today, especially since she is having diarrhea.  She has become quite deconditioned following her recent hospitalizations.  We will try to help her get a primary care provider here locally and get a home health assessment by PT.

## 2020-05-30 NOTE — Assessment & Plan Note (Signed)
We will have her collect a stool sample for C. difficile testing.

## 2020-05-30 NOTE — Assessment & Plan Note (Signed)
I believe your right lower back and posterior right leg pain is due to postherpetic neuralgia.  I will change gabapentin to pregabalin to see if that offers more relief.

## 2020-05-30 NOTE — Progress Notes (Signed)
[facility]  Patient Active Problem List   Diagnosis Date Noted  . Diarrhea 05/30/2020    Priority: High  . Postherpetic neuralgia 05/06/2020    Priority: High  . Pelvic abscess in female 05/05/2020    Priority: High  . Physical deconditioning 05/30/2020  . Pressure injury of skin 05/08/2020  . Bilateral lower extremity edema 05/05/2020  . Lactic acidosis 05/05/2020  . Essential hypertension 05/05/2020  . GERD without esophagitis 05/05/2020  . Dermatomyositis ([place]) 05/05/2020  . Hypoalbuminemia 05/05/2020  . Mixed hyperlipidemia 05/05/2020    Patient's Medications  New Prescriptions   PREGABALIN (LYRICA) 50 MG CAPSULE    Take 1 capsule (50 mg total) by mouth 3 (three) times daily.  Previous Medications   ALBUTEROL (VENTOLIN HFA) 108 (90 BASE) MCG/ACT INHALER    Inhale 1-2 puffs into the lungs every 6 (six) hours as needed for wheezing or shortness of breath.   AMLODIPINE (NORVASC) 5 MG TABLET    Take 5 mg by mouth daily.   BISOPROLOL-HYDROCHLOROTHIAZIDE (ZIAC) 2.5-6.25 MG TABLET    Take 1 tablet by mouth daily.   ETODOLAC (LODINE) 400 MG TABLET    Take 400 mg by mouth 2 (two) times daily.   FAMOTIDINE (PEPCID) 40 MG TABLET    Take 40 mg by mouth daily.   LACTOBACILLUS ACIDOPHILUS (BACID) TABS TABLET    Take 2 tablets by mouth 3 (three) times daily.   MELOXICAM (MOBIC) 15 MG TABLET    Take 15 mg by mouth daily.   MYCOPHENOLATE (CELLCEPT) 500 MG TABLET    Take 1,000 mg by mouth 2 (two) times daily.   OXYCODONE-ACETAMINOPHEN (PERCOCET/ROXICET) 5-325 MG TABLET    Take 1 tablet by mouth every 6 (six) hours as needed for moderate pain or severe pain.   PREDNISONE (DELTASONE) 10 MG TABLET    Take 10 mg by mouth daily.   ROSUVASTATIN (CRESTOR) 40 MG TABLET    Take 40 mg by mouth daily.  Modified Medications   No medications on file  Discontinued Medications   AMOXICILLIN-CLAVULANATE (AUGMENTIN) 875-125 MG TABLET    Take 1 tablet by mouth every 12  (twelve) hours.   GABAPENTIN (NEURONTIN) 300 MG CAPSULE    Take 1 capsule (300 mg total) by mouth 3 (three) times daily.    Subjective: [name] is a 74 y.o. female with a history of dermatomyositis and shingles, both diagnosed in 2019.  Ever since she had shingles she has had severe right lower back pain that radiates down her right leg.  She was hospitalized on 04/25/2020 at [facility] in [place], [place] because of this persistent pain.  She was found to have a pelvic abscess.  The abscess was aspirated.  No organisms were seen on Gram stain and abscess cultures were negative.  Admission blood cultures were negative as well.  She was discharged on 04/28/2020 amoxicillin clavulanate.  She has chronic lower extremity lymphedema that began to get much worse recently leading to her hospitalization here on 05/04/2020.  She recalls that she was told that she had a fever when she was hospitalized recently.  She was afebrile here but repeat CT scan shows a persistent 3.1 cm rim-enhancing fluid collection in the cul-de-sac between her uterus and sigmoid colon.  She was switched to IV ceftriaxone and metronidazole upon admission.  IR felt the abscess was too small and in a location where it could not be safely aspirated.  I decided  to discharge her on oral amoxicillin clavulanate.  She has now completed 35 days of antibiotic therapy.  She is no longer having any abdominal pain but she has started having diarrhea after she eats a meal.  She says that her right lower back pain that radiates down the back of her right leg is unchanged even after increasing her dose of gabapentin when she was in the hospital.  The plan is for her to stay here in [place] with her family.  She does not have a local primary care provider.  Review of Systems: Review of Systems  Constitutional: Negative for chills, diaphoresis and fever.  Gastrointestinal: Positive for diarrhea. Negative for abdominal pain, nausea  and vomiting.  Musculoskeletal: Positive for back pain.    Past Medical History:  Diagnosis Date  . Asthma   . Dermatomyositis ([place]) 05/05/2020  . Essential hypertension 05/05/2020  . GERD (gastroesophageal reflux disease)   . GERD without esophagitis 05/05/2020  . Hypercholesteremia   . Hypertension     Social History   Tobacco Use  . Smoking status: Never Smoker  . Smokeless tobacco: Never Used  Substance Use Topics  . Alcohol use: Not on file  . Drug use: Not on file    Family History  Family history unknown: Yes    Allergies  Allergen Reactions  . Chocolate Swelling    Objective: Vitals:   05/30/20 1055  BP: 126/75  Pulse: 97   There is no height or weight on file to calculate BMI.  Physical Exam Constitutional:      Comments: She is pleasant and in no distress.  She is accompanied by her son, Joneen Caraway.  Cardiovascular:     Rate and Rhythm: Normal rate and regular rhythm.  Pulmonary:     Effort: Pulmonary effort is normal.     Breath sounds: Normal breath sounds.  Abdominal:     Palpations: Abdomen is soft. There is no mass.     Tenderness: There is no abdominal tenderness.  Musculoskeletal:     Right lower leg: Edema present.     Left lower leg: Edema present.  Skin:    Findings: No rash.  Psychiatric:        Mood and Affect: Mood normal.     Lab Results    Problem List Items Addressed This Visit      High   Postherpetic neuralgia    I believe your right lower back and posterior right leg pain is due to postherpetic neuralgia.  I will change gabapentin to pregabalin to see if that offers more relief.      Relevant Medications   pregabalin (LYRICA) 50 MG capsule   Pelvic abscess in female    I suspect she has been treated adequately to resolve her abscess.  Have her stop amoxicillin clavulanate today, especially since she is having diarrhea.  She has become quite deconditioned following her recent hospitalizations.  We will try to help her  get a primary care provider here locally and get a home health assessment by PT.      Diarrhea    We will have her collect a stool sample for C. difficile testing.      Relevant Orders   Clostridium Difficile by PCR([facility])     Unprioritized   Physical deconditioning       Michel Bickers, MD [facility] for [place] [phone] pager   [phone] cell 05/30/2020, 11:35 AM

## 2020-06-19 ENCOUNTER — Other Ambulatory Visit: Payer: Self-pay | Admitting: Internal Medicine

## 2020-06-19 ENCOUNTER — Ambulatory Visit: Payer: Medicare PPO | Admitting: Internal Medicine

## 2020-06-19 ENCOUNTER — Encounter: Payer: Self-pay | Admitting: Internal Medicine

## 2020-06-19 VITALS — BP 114/61 | HR 92 | Temp 98.4°F | Wt 155.2 lb

## 2020-06-19 DIAGNOSIS — E8809 Other disorders of plasma-protein metabolism, not elsewhere classified: Secondary | ICD-10-CM | POA: Diagnosis not present

## 2020-06-19 DIAGNOSIS — I10 Essential (primary) hypertension: Secondary | ICD-10-CM

## 2020-06-19 DIAGNOSIS — L89152 Pressure ulcer of sacral region, stage 2: Secondary | ICD-10-CM

## 2020-06-19 DIAGNOSIS — B0229 Other postherpetic nervous system involvement: Secondary | ICD-10-CM

## 2020-06-19 DIAGNOSIS — R600 Localized edema: Secondary | ICD-10-CM

## 2020-06-19 DIAGNOSIS — M3390 Dermatopolymyositis, unspecified, organ involvement unspecified: Secondary | ICD-10-CM | POA: Diagnosis not present

## 2020-06-19 DIAGNOSIS — R197 Diarrhea, unspecified: Secondary | ICD-10-CM

## 2020-06-19 DIAGNOSIS — K219 Gastro-esophageal reflux disease without esophagitis: Secondary | ICD-10-CM

## 2020-06-19 DIAGNOSIS — R5381 Other malaise: Secondary | ICD-10-CM

## 2020-06-19 MED ORDER — OMEPRAZOLE 40 MG PO CPDR: 40.0000 mg | DELAYED_RELEASE_CAPSULE | Freq: Every day | ORAL | 1 refills | Status: DC

## 2020-06-19 MED ORDER — GLUCERNA SHAKE PO LIQD: 237.0000 mL | Freq: Three times a day (TID) | ORAL | 0 refills | Status: DC

## 2020-06-19 MED ORDER — PREDNISONE 10 MG PO TABS: 10.0000 mg | ORAL_TABLET | Freq: Every day | ORAL | 1 refills | Status: DC

## 2020-06-19 MED ORDER — PREGABALIN 100 MG PO CAPS: 100.0000 mg | ORAL_CAPSULE | Freq: Three times a day (TID) | ORAL | 1 refills | Status: DC

## 2020-06-19 NOTE — Patient Instructions (Addendum)
Ms. Busic, It was nice meeting you.   Today we discussed:   Incontinence and sacral wounds: Try to rotate in bed as much as possible and use any form of barrier cream you prefer to protect the skin.   Nutrition: I'd like you to try some protein shakes to supplement your nutrition.   Dermatomyositis: Based on the last note, you should be on Prednisone 10 mg daily. I have refilled this for you. Please be sure to follow-up with your rheumatologist as scheduled.   Acid reflux: please start taking Omeprazole 40 mg daily.   Leg pain: increase the lyrica to 100 mg 3x daily.   Please return in 4 weeks to continue working on your medical conditions.  I am having lab work checked today and will let you know if we need to do anything differently.   Take care,  Dr. Koleen Distance

## 2020-06-20 ENCOUNTER — Other Ambulatory Visit: Payer: Medicare PPO

## 2020-06-20 DIAGNOSIS — R197 Diarrhea, unspecified: Secondary | ICD-10-CM

## 2020-06-20 LAB — CMP14 + ANION GAP
ALT: 28 IU/L (ref 0–32)
AST: 88 IU/L — ABNORMAL HIGH (ref 0–40)
Albumin/Globulin Ratio: 0.8 — ABNORMAL LOW (ref 1.2–2.2)
Albumin: 2.6 g/dL — ABNORMAL LOW (ref 3.7–4.7)
Alkaline Phosphatase: 57 IU/L (ref 48–121)
Anion Gap: 11 mmol/L (ref 10.0–18.0)
BUN/Creatinine Ratio: 25 (ref 12–28)
BUN: 15 mg/dL (ref 8–27)
Bilirubin Total: 0.5 mg/dL (ref 0.0–1.2)
CO2: 23 mmol/L (ref 20–29)
Calcium: 8.2 mg/dL — ABNORMAL LOW (ref 8.7–10.3)
Chloride: 98 mmol/L (ref 96–106)
Creatinine, Ser: 0.59 mg/dL (ref 0.57–1.00)
GFR calc Af Amer: 106 mL/min/{1.73_m2} (ref >59)
GFR calc non Af Amer: 92 mL/min/{1.73_m2} (ref >59)
Globulin, Total: 3.3 g/dL (ref 1.5–4.5)
Glucose: 92 mg/dL (ref 65–99)
Potassium: 4.7 mmol/L (ref 3.5–5.2)
Sodium: 132 mmol/L — ABNORMAL LOW (ref 134–144)
Total Protein: 5.9 g/dL — ABNORMAL LOW (ref 6.0–8.5)

## 2020-06-20 LAB — CBC WITH DIFFERENTIAL/PLATELET
Basophils Absolute: 0 10*3/uL (ref 0.0–0.2)
Basos: 1 %
EOS (ABSOLUTE): 0.4 10*3/uL (ref 0.0–0.4)
Eos: 6 %
Hematocrit: 31.4 % — ABNORMAL LOW (ref 34.0–46.6)
Hemoglobin: 10.8 g/dL — ABNORMAL LOW (ref 11.1–15.9)
Immature Grans (Abs): 0.1 10*3/uL (ref 0.0–0.1)
Immature Granulocytes: 1 %
Lymphocytes Absolute: 1.1 10*3/uL (ref 0.7–3.1)
Lymphs: 16 %
MCH: 32.8 pg (ref 26.6–33.0)
MCHC: 34.4 g/dL (ref 31.5–35.7)
MCV: 95 fL (ref 79–97)
Monocytes Absolute: 1 10*3/uL — ABNORMAL HIGH (ref 0.1–0.9)
Monocytes: 15 %
Neutrophils Absolute: 4.1 10*3/uL (ref 1.4–7.0)
Neutrophils: 61 %
Platelets: 188 10*3/uL (ref 150–450)
RBC: 3.29 x10E6/uL — ABNORMAL LOW (ref 3.77–5.28)
RDW: 14 % (ref 11.7–15.4)
WBC: 6.6 10*3/uL (ref 3.4–10.8)

## 2020-06-20 LAB — CK: Total CK: 215 U/L — ABNORMAL HIGH (ref 32–182)

## 2020-06-20 LAB — MAGNESIUM: Magnesium: 1.6 mg/dL (ref 1.6–2.3)

## 2020-06-20 NOTE — Addendum Note (Signed)
Addended by: Truddie Crumble on: 06/20/2020 02:06 PM   Modules accepted: Orders

## 2020-06-21 ENCOUNTER — Telehealth: Payer: Self-pay | Admitting: Student

## 2020-06-21 ENCOUNTER — Telehealth: Payer: Self-pay

## 2020-06-21 NOTE — Telephone Encounter (Signed)
Returned call to patient. She has not heard from [facility] PT. Not seeing referral to CCM in [place]. CM sent to Gwenyth Bender at [facility] to see if she can take patient. Hubbard Hartshorn, BSN, RN-BC

## 2020-06-21 NOTE — Telephone Encounter (Signed)
Returned call to patient. No answer. Left message on VM requesting return call. L. Verneice Caspers, RN, BSN     

## 2020-06-21 NOTE — Telephone Encounter (Signed)
Pls contact regarding home health care [phone]

## 2020-06-21 NOTE — Telephone Encounter (Signed)
Questions about med, please call pt back.

## 2020-06-22 LAB — CLOSTRIDIUM DIFFICILE BY PCR: Toxigenic C. Difficile by PCR: NEGATIVE

## 2020-06-24 NOTE — Telephone Encounter (Signed)
Rec'd call from caretaker son(Rayvone) wanting to know why no one has contacted the pt in regards to her [place] orders.  Per Rayvone  The pt is struggling with pain and the current meds are not working and is requesting to get some Physical therapy started as soon as possible.  Please call [phone].

## 2020-06-27 ENCOUNTER — Encounter: Payer: Self-pay | Admitting: Internal Medicine

## 2020-06-27 NOTE — Progress Notes (Signed)
New Patient Office Visit  Subjective:  Patient ID: Haley Johnson, female    DOB: 1946/05/12  Age: 74 y.o. MRN: [id number]  CC:  Chief Complaint  Patient presents with  . Establish Care  . decreased appetite  . Dysphagia  . Arm Swelling  . Skin Ulcer    buttocks-2/2 urinary/fecal incont (uses pull-ups)    HPI Haley Johnson is a 74 y/o female with dermatomyositis, GERD presents to establish care. She is accompanied by her son who recently moved her here from [place] to be closer to family. She was admitted last month for a pelvic abscess. She completed a prolonged antibiotic course and had follow-up with ID as an outpatient on 7/28. At that time, Dr. Megan Salon felt the infection had been adequately treated.  Her son notes that she is significantly weak and deconditioned from recent hospitalizations. She is fully dependant on others for her care which has become quite burdensome for her family.  He is hoping to get some resources into the home, specifically PT and wound care for her sacral wounds which have been difficult to manage due to diarrhea which seems to have developed in the setting of her multiple courses of antibiotics.   Past Medical History:  Diagnosis Date  . Asthma   . Dermatomyositis ([place]) 05/05/2020  . Essential hypertension 05/05/2020  . GERD (gastroesophageal reflux disease)   . GERD without esophagitis 05/05/2020  . Hypercholesteremia   . Hypertension     History reviewed. No pertinent surgical history.  Family History  Mother: breast cancer    Social history: lives with son; dependent on family for most of her care; essentially bed/chair bound secondary to weakness; no tobacco, EtOH or illicit drug use   ROS Review of Systems  Constitutional: Negative for chills and fever.  HENT: Negative for trouble swallowing.   Eyes: Negative for visual disturbance.  Respiratory: Negative for cough and shortness of breath.   Cardiovascular: Negative for  chest pain and palpitations.  Gastrointestinal: Negative for abdominal pain, blood in stool, rectal pain and vomiting.  Genitourinary: Negative for dysuria.  Skin: Positive for wound.  Neurological: Negative for tremors, seizures, speech difficulty, numbness and headaches.  Psychiatric/Behavioral: Negative for sleep disturbance.    Objective:   Today's Vitals: BP (!) 114/61 (BP Location: Left Arm, Patient Position: Sitting, Cuff Size: Normal)   Pulse 92   Temp 98.4 F (36.9 C) (Oral)   Wt 155 lb 3.2 oz (70.4 kg)   SpO2 100%   BMI 28.39 kg/m   Physical Exam Constitutional:      General: She is not in acute distress.    Comments: Chronically ill appearing  Eyes:     Conjunctiva/sclera: Conjunctivae normal.  Cardiovascular:     Rate and Rhythm: Normal rate and regular rhythm.  Pulmonary:     Effort: Pulmonary effort is normal.     Breath sounds: Normal breath sounds.  Abdominal:     General: There is no distension.     Palpations: Abdomen is soft.     Tenderness: There is no abdominal tenderness.  Musculoskeletal:     Cervical back: Neck supple.     Right lower leg: Edema present.     Left lower leg: Edema present.  Lymphadenopathy:     Cervical: No cervical adenopathy.  Skin:    Comments: 2 stage II decubitus ulcers approximately 1-2 cm in size on buttocks. No drainage or signs of infection.   Neurological:     General: No  focal deficit present.     Mental Status: She is oriented to person, place, and time.  Psychiatric:        Mood and Affect: Mood normal.        Behavior: Behavior normal.     Assessment & Plan:   Problem List Items Addressed This Visit      Cardiovascular and Mediastinum   Essential hypertension   Relevant Orders   CMP14 + Anion Gap (Completed)     Digestive   GERD without esophagitis    Symptoms uncontrolled on famotidine. Will do a trial of PPI to see if this improves her symptoms.         Nervous and Auditory   Postherpetic  neuralgia    Still having significant pain despite change from gabapentin to Lyrica at recent visit with ID. Will uptitrate to 100 mg TID.       Relevant Medications   pregabalin (LYRICA) 100 MG capsule   Other Relevant Orders   Ambulatory referral to [place]     Musculoskeletal and Integument   Dermatomyositis ([place])    Followed by rheumatologist through [facility]. Previous note mentions continuing chronic Prednisone 10 mg daily. Patient states she has not been taking this since she ran out. She is unable to tell me how long ago it was, but approximates it at 1-2 weeks. I discussed the risks of stopping this medication suddenly. She denied any recent increase in muscle pain. Does not appear to have evidence of adrenal insufficiency.  CK today is normal.  Refill sent for Prednisone 10 mg daily. Continue follow-up with rheumatologist as scheduled.       Relevant Orders   CK, total (Completed)   Pressure injury of skin    Stage II sacral wounds do not appear to be infected. They are in the setting of decreased mobility and persistent diarrhea.  Have placed home health order for wound care. In the mean time, advised to rotate in bed as much as possible for offloading and to use a barrier cream of choice.       Relevant Orders   Ambulatory referral to [facility]     Other   Bilateral lower extremity edema - Primary   Relevant Orders   CBC with Diff (Completed)   Magnesium (Completed)   Hypoalbuminemia    Albumin 2.6 on CMP today. Likely contributing to peripheral edema. Advised her to try glucerna shakes a few times per day to help with protein supplementation. Prescription sent.       Diarrhea    ID recommended C. Diff testing given her prolonged antibiotic course which was negative. Suspect this may still be antibiotic-associated and should hopefully resolve now that she has completed therapy.  Since C. Diff, is negative, can treat symptomatically with imodium. Also advised they  could try probiotics to see if this makes a difference.       Physical deconditioning    Patient significantly deconditioned which son reports has been a progressive decline over the last couple of months since her hospitalizations for pelvic abscess.  Will consult CCM to assist in getting home health services, including PT and wound care.       Relevant Orders   Ambulatory referral to Home Health      Outpatient Encounter Medications as of 06/19/2020  Medication Sig  . albuterol (VENTOLIN HFA) 108 (90 Base) MCG/ACT inhaler Inhale 1-2 puffs into the lungs every 6 (six) hours as needed for wheezing or shortness of breath.  Marland Kitchen  amLODipine (NORVASC) 5 MG tablet Take 5 mg by mouth daily.  . bisoprolol-hydrochlorothiazide (ZIAC) 2.5-6.25 MG tablet Take 1 tablet by mouth daily.  Marland Kitchen etodolac (LODINE) 400 MG tablet Take 400 mg by mouth 2 (two) times daily.  . famotidine (PEPCID) 40 MG tablet Take 40 mg by mouth daily.  . feeding supplement, GLUCERNA SHAKE, (GLUCERNA SHAKE) LIQD Take 237 mLs by mouth 3 (three) times daily between meals.  . meloxicam (MOBIC) 15 MG tablet Take 15 mg by mouth daily.  . mycophenolate (CELLCEPT) 500 MG tablet Take 1,000 mg by mouth 2 (two) times daily.  Marland Kitchen oxyCODONE-acetaminophen (PERCOCET/ROXICET) 5-325 MG tablet Take 1 tablet by mouth every 6 (six) hours as needed for moderate pain or severe pain.  . predniSONE (DELTASONE) 10 MG tablet Take 1 tablet (10 mg total) by mouth daily.  . pregabalin (LYRICA) 100 MG capsule Take 1 capsule (100 mg total) by mouth 3 (three) times daily.  . rosuvastatin (CRESTOR) 40 MG tablet Take 40 mg by mouth daily.  . [DISCONTINUED] omeprazole (PRILOSEC) 40 MG capsule Take 1 capsule (40 mg total) by mouth daily.  . [DISCONTINUED] predniSONE (DELTASONE) 10 MG tablet Take 10 mg by mouth daily.  . [DISCONTINUED] pregabalin (LYRICA) 50 MG capsule Take 1 capsule (50 mg total) by mouth 3 (three) times daily.   No facility-administered encounter  medications on file as of 06/19/2020.    Follow-up: Return in about 4 weeks (around 07/17/2020) for PCP follow-up.   Delice Bison, DO

## 2020-06-27 NOTE — Assessment & Plan Note (Addendum)
Followed by rheumatologist through [facility]. Previous note mentions continuing chronic Prednisone 10 mg daily. Patient states she has not been taking this since she ran out. She is unable to tell me how long ago it was, but approximates it at 1-2 weeks. I discussed the risks of stopping this medication suddenly. She denied any recent increase in muscle pain. Does not appear to have evidence of adrenal insufficiency.  CK today is normal.  Refill sent for Prednisone 10 mg daily. Continue follow-up with rheumatologist as scheduled.

## 2020-06-27 NOTE — Assessment & Plan Note (Signed)
Albumin 2.6 on CMP today. Likely contributing to peripheral edema. Advised her to try glucerna shakes a few times per day to help with protein supplementation. Prescription sent.

## 2020-06-27 NOTE — Assessment & Plan Note (Signed)
ID recommended C. Diff testing given her prolonged antibiotic course which was negative. Suspect this may still be antibiotic-associated and should hopefully resolve now that she has completed therapy.  Since C. Diff, is negative, can treat symptomatically with imodium. Also advised they could try probiotics to see if this makes a difference.

## 2020-06-27 NOTE — Assessment & Plan Note (Addendum)
Stage II sacral wounds do not appear to be infected. They are in the setting of decreased mobility and persistent diarrhea.  Have placed home health order for wound care. In the mean time, advised to rotate in bed as much as possible for offloading and to use a barrier cream of choice.

## 2020-06-27 NOTE — Assessment & Plan Note (Signed)
Still having significant pain despite change from gabapentin to Lyrica at recent visit with ID. Will uptitrate to 100 mg TID.

## 2020-06-27 NOTE — Assessment & Plan Note (Signed)
Symptoms uncontrolled on famotidine. Will do a trial of PPI to see if this improves her symptoms.

## 2020-06-27 NOTE — Telephone Encounter (Signed)
[place], [place], [place], [place]; [place], [place] thing. I will get everything together and submitted. I will let you know when services have begun.    ----- Message -----  From: Judge Stall, [place]  Sent: 06/25/2020  3:29 PM EDT  To: Gwenyth Bender  Subject: RE: [facility] PT                     Will you let us no the Start of care Date for patient

## 2020-06-27 NOTE — Assessment & Plan Note (Signed)
Patient significantly deconditioned which son reports has been a progressive decline over the last couple of months since her hospitalizations for pelvic abscess.  Will consult CCM to assist in getting home health services, including PT and wound care.

## 2020-06-28 ENCOUNTER — Telehealth: Payer: Self-pay | Admitting: *Deleted

## 2020-06-28 NOTE — Telephone Encounter (Signed)
PT from [facility] is in the home when nurse returned call to pt/ son. She ask for VO for  2x week for 4 weeks for strengthening and safety, VO given Do you agree? Also she states pt has  ziac 2.5/ 6.25 1 tablet by mouth daily, pt and son acknowledge that pt has continued to take this, Tillie Rung, [facility] PT was ask to ask pt to continue until she sees MD at next visit, do you agree? She is ask to tell pt to stop pepcid and should continue with prilosec 40mg  daily by mouth kendra also informs triage that pt's left lower leg/ foot swollen, 2+ edema, not shiny, not red, not warmer than other foot, not painful. Just edema. Please advise?

## 2020-07-01 NOTE — Progress Notes (Signed)
Internal Medicine Clinic Attending  Case discussed with Dr. [place]  At the time of the visit.  We reviewed the resident's history and exam and pertinent patient test results.  I agree with the assessment, diagnosis, and plan of care documented in the resident's note.  

## 2020-07-02 NOTE — Telephone Encounter (Signed)
Attempted to call patient x2 (8/6 and today) to clarify her symptoms, but did not go through and voicemail not set up. Agree with home PT and [facility]. Would like to assess for additional symptoms besides her leg swelling such as SOB before making recommendation on that. If any worsening or worrisome symptoms, she should be evaluated.

## 2020-07-03 ENCOUNTER — Ambulatory Visit: Payer: Medicare PPO | Admitting: Internal Medicine

## 2020-07-03 NOTE — Telephone Encounter (Signed)
[place], Britney  [place], [place], [place] Afternoon,   Ms. Arp was admitted to services on 06/28/2020

## 2020-07-04 ENCOUNTER — Telehealth: Payer: Self-pay | Admitting: *Deleted

## 2020-07-04 NOTE — Telephone Encounter (Signed)
Clean with wound cleanser, 3 - 2 sacral, 1 perineum all stage 2 wounds Apply aquasil to wound bed Apply duoderm 2x week for 4 weeks Speech therapy eval and treat [facility] Do you agree?

## 2020-07-05 NOTE — Telephone Encounter (Signed)
Agree, thank you

## 2020-07-09 ENCOUNTER — Other Ambulatory Visit: Payer: Self-pay | Admitting: Internal Medicine

## 2020-07-09 ENCOUNTER — Telehealth: Payer: Self-pay | Admitting: *Deleted

## 2020-07-09 ENCOUNTER — Telehealth: Payer: Self-pay | Admitting: Student

## 2020-07-09 DIAGNOSIS — R1310 Dysphagia, unspecified: Secondary | ICD-10-CM

## 2020-07-09 NOTE — Telephone Encounter (Signed)
MBS has been ordered; I called Minda ST, [facility] to notify her per her request.

## 2020-07-09 NOTE — Progress Notes (Signed)
Placed order for modified barium swallow study per speech therapist evaluation result and rec.

## 2020-07-09 NOTE — Telephone Encounter (Signed)
Call from [place] therapist with Haley Johnson - stated she just finished her evaluation. Pt is having swallowing issues taking 2-3 swallows with water. Not eating much, eating applesauce which she is also having difficulty. Haley Johnson is requesting an order to be place for Modified Barium Swallowing study here at [facility] as soon as possible. Also requested verbal order for "ST to be continue"; vo given. Thanks

## 2020-07-09 NOTE — Telephone Encounter (Signed)
Rec'd call phone son f/u with a Referral.  He states pt was seen on 06/19/2020 by Dr. Koleen Distance and a Rheumatologist Referral was to be placed Please advise if a referral can be placed.

## 2020-07-10 NOTE — Telephone Encounter (Signed)
I apologize for the confusion. I had initially thought she could still follow with her previous one, but since she moved she will need one closer to the area. Thank you, Dr. Myrtie Hawk!

## 2020-07-12 ENCOUNTER — Telehealth: Payer: Self-pay | Admitting: Student

## 2020-07-12 ENCOUNTER — Telehealth: Payer: Self-pay | Admitting: *Deleted

## 2020-07-12 ENCOUNTER — Other Ambulatory Visit: Payer: Self-pay | Admitting: Internal Medicine

## 2020-07-12 ENCOUNTER — Other Ambulatory Visit: Payer: Self-pay | Admitting: Student

## 2020-07-12 ENCOUNTER — Other Ambulatory Visit (HOSPITAL_COMMUNITY): Payer: Self-pay | Admitting: *Deleted

## 2020-07-12 DIAGNOSIS — R600 Localized edema: Secondary | ICD-10-CM

## 2020-07-12 DIAGNOSIS — M3390 Dermatopolymyositis, unspecified, organ involvement unspecified: Secondary | ICD-10-CM

## 2020-07-12 DIAGNOSIS — R1310 Dysphagia, unspecified: Secondary | ICD-10-CM

## 2020-07-12 MED ORDER — TORSEMIDE 20 MG PO TABS: ORAL_TABLET | ORAL | 0 refills | Status: DC

## 2020-07-12 NOTE — Telephone Encounter (Signed)
Spoke with patient about her symptoms of upper and lower extremity edema. She has had chronic lower extremity edema, but the upper extremity swelling started about 1 month ago. She denies SOB. She sometimes feels congested when she lies down but does not seem to have orthopnea. Denies chest pain or palpitations. She has noted increased dyspnea on exertion. The home health nurse confirmed her extremity edema, and the lack of SOB at rest.  Per chart review, she was noted to have LE edema at last hospitalization in June. Echocardiogram at that time showed 65-70% LVEF, unable to assess diastolic function. LE Doppler was also negative. Her swelling at that time resolved with Lasix.  Discussed with patient and her family our treatment plan of Torsemide 20mg  daily over the weekend, and assessment in our office on Monday. Instructed to see Urgent Care or ED if worsening SOB or CP. They are agreeable to this plan. Spoke with front desk about scheduling her on Monday.

## 2020-07-12 NOTE — Telephone Encounter (Signed)
Just spoke with patient's son.  Agreed to an appointment this Monday 07/15/2020 at 2 pm on Dr. Lianne Moris schedule.

## 2020-07-12 NOTE — Telephone Encounter (Signed)
-----   Message from Andrew Au, MD sent at 07/12/2020  2:23 PM EDT ----- Regarding: appointment for Monday Please schedule Ms. Pates for an appointment this Monday, 8/23, for her upper and lower extremity edema.  Thanks! Dr. Bridgett Larsson

## 2020-07-12 NOTE — Telephone Encounter (Signed)
Call from [facility], [facility] - stated she's currently in the home, pt has 4+ upper and lower edema. Pt is taking Ziac. No current weight; stated pt has not been monitoring her weight. Stated she will be in the home for 30 more minutes. Otherwise, may call her @ [phone]. Thanks

## 2020-07-12 NOTE — Telephone Encounter (Signed)
Please let me know once the New Referral Request has been placed.

## 2020-07-12 NOTE — Progress Notes (Signed)
Placed order for ambulatory referral to rheumatologist for dermatomyositis.

## 2020-07-15 ENCOUNTER — Ambulatory Visit (INDEPENDENT_AMBULATORY_CARE_PROVIDER_SITE_OTHER): Payer: Medicare PPO | Admitting: Student

## 2020-07-15 ENCOUNTER — Other Ambulatory Visit: Payer: Self-pay

## 2020-07-15 ENCOUNTER — Inpatient Hospital Stay (HOSPITAL_COMMUNITY)
Admission: AD | Admit: 2020-07-15 | Discharge: 2020-07-21 | DRG: 546 | Disposition: A | Discharge status: Other Acute Inpatient Hospital | Payer: Medicare PPO | Source: Ambulatory Visit | Attending: Internal Medicine | Admitting: Internal Medicine

## 2020-07-15 ENCOUNTER — Encounter: Payer: Self-pay | Admitting: Student

## 2020-07-15 ENCOUNTER — Observation Stay (HOSPITAL_COMMUNITY): Payer: Medicare PPO

## 2020-07-15 DIAGNOSIS — K219 Gastro-esophageal reflux disease without esophagitis: Secondary | ICD-10-CM

## 2020-07-15 DIAGNOSIS — R0600 Dyspnea, unspecified: Secondary | ICD-10-CM

## 2020-07-15 DIAGNOSIS — W19XXXA Unspecified fall, initial encounter: Secondary | ICD-10-CM

## 2020-07-15 DIAGNOSIS — S0990XA Unspecified injury of head, initial encounter: Secondary | ICD-10-CM | POA: Diagnosis present

## 2020-07-15 DIAGNOSIS — K224 Dyskinesia of esophagus: Secondary | ICD-10-CM | POA: Diagnosis present

## 2020-07-15 DIAGNOSIS — R197 Diarrhea, unspecified: Secondary | ICD-10-CM

## 2020-07-15 DIAGNOSIS — M5136 Other intervertebral disc degeneration, lumbar region: Secondary | ICD-10-CM

## 2020-07-15 DIAGNOSIS — Z803 Family history of malignant neoplasm of breast: Secondary | ICD-10-CM

## 2020-07-15 DIAGNOSIS — M5137 Other intervertebral disc degeneration, lumbosacral region: Secondary | ICD-10-CM

## 2020-07-15 DIAGNOSIS — E8809 Other disorders of plasma-protein metabolism, not elsewhere classified: Secondary | ICD-10-CM

## 2020-07-15 DIAGNOSIS — T17900A Unspecified foreign body in respiratory tract, part unspecified causing asphyxiation, initial encounter: Secondary | ICD-10-CM | POA: Diagnosis present

## 2020-07-15 DIAGNOSIS — R269 Unspecified abnormalities of gait and mobility: Secondary | ICD-10-CM

## 2020-07-15 DIAGNOSIS — R238 Other skin changes: Secondary | ICD-10-CM | POA: Insufficient documentation

## 2020-07-15 DIAGNOSIS — I10 Essential (primary) hypertension: Secondary | ICD-10-CM | POA: Diagnosis present

## 2020-07-15 DIAGNOSIS — L22 Diaper dermatitis: Secondary | ICD-10-CM | POA: Diagnosis present

## 2020-07-15 DIAGNOSIS — R600 Localized edema: Secondary | ICD-10-CM | POA: Diagnosis not present

## 2020-07-15 DIAGNOSIS — W101XXD Fall (on)(from) sidewalk curb, subsequent encounter: Secondary | ICD-10-CM

## 2020-07-15 DIAGNOSIS — W1839XA Other fall on same level, initial encounter: Secondary | ICD-10-CM

## 2020-07-15 DIAGNOSIS — Z8042 Family history of malignant neoplasm of prostate: Secondary | ICD-10-CM

## 2020-07-15 DIAGNOSIS — M51369 Other intervertebral disc degeneration, lumbar region without mention of lumbar back pain or lower extremity pain: Secondary | ICD-10-CM

## 2020-07-15 DIAGNOSIS — R5381 Other malaise: Secondary | ICD-10-CM

## 2020-07-15 DIAGNOSIS — Y92009 Unspecified place in unspecified non-institutional (private) residence as the place of occurrence of the external cause: Secondary | ICD-10-CM

## 2020-07-15 DIAGNOSIS — E440 Moderate protein-calorie malnutrition: Secondary | ICD-10-CM | POA: Insufficient documentation

## 2020-07-15 DIAGNOSIS — S0081XA Abrasion of other part of head, initial encounter: Secondary | ICD-10-CM

## 2020-07-15 DIAGNOSIS — R634 Abnormal weight loss: Secondary | ICD-10-CM | POA: Diagnosis present

## 2020-07-15 DIAGNOSIS — R296 Repeated falls: Secondary | ICD-10-CM | POA: Diagnosis present

## 2020-07-15 DIAGNOSIS — R42 Dizziness and giddiness: Secondary | ICD-10-CM

## 2020-07-15 DIAGNOSIS — E8770 Fluid overload, unspecified: Secondary | ICD-10-CM | POA: Diagnosis present

## 2020-07-15 DIAGNOSIS — M3313 Other dermatomyositis without myopathy: Principal | ICD-10-CM | POA: Diagnosis present

## 2020-07-15 DIAGNOSIS — H93299 Other abnormal auditory perceptions, unspecified ear: Secondary | ICD-10-CM | POA: Diagnosis present

## 2020-07-15 DIAGNOSIS — R1312 Dysphagia, oropharyngeal phase: Secondary | ICD-10-CM | POA: Diagnosis present

## 2020-07-15 DIAGNOSIS — E46 Unspecified protein-calorie malnutrition: Secondary | ICD-10-CM

## 2020-07-15 DIAGNOSIS — J45909 Unspecified asthma, uncomplicated: Secondary | ICD-10-CM | POA: Diagnosis present

## 2020-07-15 DIAGNOSIS — Y92481 Parking lot as the place of occurrence of the external cause: Secondary | ICD-10-CM

## 2020-07-15 DIAGNOSIS — Z7401 Bed confinement status: Secondary | ICD-10-CM

## 2020-07-15 DIAGNOSIS — D72829 Elevated white blood cell count, unspecified: Secondary | ICD-10-CM | POA: Diagnosis not present

## 2020-07-15 DIAGNOSIS — R32 Unspecified urinary incontinence: Secondary | ICD-10-CM | POA: Diagnosis present

## 2020-07-15 DIAGNOSIS — E7800 Pure hypercholesterolemia, unspecified: Secondary | ICD-10-CM | POA: Diagnosis present

## 2020-07-15 DIAGNOSIS — Z833 Family history of diabetes mellitus: Secondary | ICD-10-CM

## 2020-07-15 DIAGNOSIS — R1310 Dysphagia, unspecified: Secondary | ICD-10-CM

## 2020-07-15 DIAGNOSIS — E785 Hyperlipidemia, unspecified: Secondary | ICD-10-CM | POA: Diagnosis present

## 2020-07-15 DIAGNOSIS — Z6824 Body mass index (BMI) 24.0-24.9, adult: Secondary | ICD-10-CM

## 2020-07-15 LAB — CBC WITH DIFFERENTIAL/PLATELET
Abs Immature Granulocytes: 0.05 10*3/uL (ref 0.00–0.07)
Basophils Absolute: 0 10*3/uL (ref 0.0–0.1)
Basophils Relative: 0 %
Eosinophils Absolute: 0 10*3/uL (ref 0.0–0.5)
Eosinophils Relative: 0 %
HCT: 33.7 % — ABNORMAL LOW (ref 36.0–46.0)
Hemoglobin: 11 g/dL — ABNORMAL LOW (ref 12.0–15.0)
Immature Granulocytes: 1 %
Lymphocytes Relative: 13 %
Lymphs Abs: 1.2 10*3/uL (ref 0.7–4.0)
MCH: 31.3 pg (ref 26.0–34.0)
MCHC: 32.6 g/dL (ref 30.0–36.0)
MCV: 95.7 fL (ref 80.0–100.0)
Monocytes Absolute: 1.2 10*3/uL — ABNORMAL HIGH (ref 0.1–1.0)
Monocytes Relative: 13 %
Neutro Abs: 7.1 10*3/uL (ref 1.7–7.7)
Neutrophils Relative %: 73 %
Platelets: 185 10*3/uL (ref 150–400)
RBC: 3.52 MIL/uL — ABNORMAL LOW (ref 3.87–5.11)
RDW: 16.6 % — ABNORMAL HIGH (ref 11.5–15.5)
WBC: 9.5 10*3/uL (ref 4.0–10.5)
nRBC: 0 % (ref 0.0–0.2)

## 2020-07-15 LAB — CK: Total CK: 153 U/L (ref 38–234)

## 2020-07-15 LAB — GLUCOSE, CAPILLARY
Glucose-Capillary: 92 mg/dL (ref 70–99)
Glucose-Capillary: 127 mg/dL — ABNORMAL HIGH (ref 70–99)

## 2020-07-15 LAB — TSH: TSH: 0.863 u[IU]/mL (ref 0.350–4.500)

## 2020-07-15 LAB — COMPREHENSIVE METABOLIC PANEL
ALT: 44 U/L (ref 0–44)
AST: 101 U/L — ABNORMAL HIGH (ref 15–41)
Albumin: 2.2 g/dL — ABNORMAL LOW (ref 3.5–5.0)
Alkaline Phosphatase: 66 U/L (ref 38–126)
Anion gap: 11 (ref 5–15)
BUN: 26 mg/dL — ABNORMAL HIGH (ref 8–23)
CO2: 30 mmol/L (ref 22–32)
Calcium: 8.6 mg/dL — ABNORMAL LOW (ref 8.9–10.3)
Chloride: 94 mmol/L — ABNORMAL LOW (ref 98–111)
Creatinine, Ser: 1.19 mg/dL — ABNORMAL HIGH (ref 0.44–1.00)
GFR calc Af Amer: 52 mL/min — ABNORMAL LOW (ref >60)
GFR calc non Af Amer: 45 mL/min — ABNORMAL LOW (ref >60)
Glucose, Bld: 103 mg/dL — ABNORMAL HIGH (ref 70–99)
Potassium: 4.1 mmol/L (ref 3.5–5.1)
Sodium: 135 mmol/L (ref 135–145)
Total Bilirubin: 1 mg/dL (ref 0.3–1.2)
Total Protein: 6.4 g/dL — ABNORMAL LOW (ref 6.5–8.1)

## 2020-07-15 LAB — PHOSPHORUS: Phosphorus: 2.9 mg/dL (ref 2.5–4.6)

## 2020-07-15 LAB — BRAIN NATRIURETIC PEPTIDE: B Natriuretic Peptide: 180 pg/mL — ABNORMAL HIGH (ref 0.0–100.0)

## 2020-07-15 LAB — MAGNESIUM: Magnesium: 1.4 mg/dL — ABNORMAL LOW (ref 1.7–2.4)

## 2020-07-15 MED ORDER — ENOXAPARIN SODIUM 40 MG/0.4ML ~~LOC~~ SOLN
40.0000 mg | SUBCUTANEOUS | Status: DC
  Administered 2020-07-15 – 2020-07-20 (×5): 40 mg via SUBCUTANEOUS
  Filled 2020-07-15 (×5): qty 0.4

## 2020-07-15 MED ORDER — PREGABALIN 100 MG PO CAPS
100.0000 mg | ORAL_CAPSULE | Freq: Three times a day (TID) | ORAL | Status: DC
  Administered 2020-07-15 – 2020-07-19 (×11): 100 mg via ORAL
  Filled 2020-07-15 (×12): qty 1

## 2020-07-15 MED ORDER — SODIUM CHLORIDE 0.9 % IV BOLUS
500.0000 mL | Freq: Once | INTRAVENOUS | Status: AC
  Administered 2020-07-15: 500 mL via INTRAVENOUS

## 2020-07-15 MED ORDER — MYCOPHENOLATE MOFETIL 250 MG PO CAPS
1000.0000 mg | ORAL_CAPSULE | Freq: Two times a day (BID) | ORAL | Status: DC
  Administered 2020-07-15 – 2020-07-19 (×8): 1000 mg via ORAL
  Filled 2020-07-15 (×12): qty 4

## 2020-07-15 MED ORDER — SODIUM CHLORIDE 0.9 % IV SOLN
INTRAVENOUS | Status: DC | PRN
  Administered 2020-07-15: 1000 mL via INTRAVENOUS

## 2020-07-15 MED ORDER — ZINC OXIDE 40 % EX OINT
1.0000 "application " | TOPICAL_OINTMENT | Freq: Two times a day (BID) | CUTANEOUS | Status: DC
  Administered 2020-07-15 – 2020-07-20 (×10): 1 via TOPICAL
  Filled 2020-07-15: qty 57

## 2020-07-15 MED ORDER — PANTOPRAZOLE SODIUM 40 MG PO TBEC
40.0000 mg | DELAYED_RELEASE_TABLET | Freq: Every day | ORAL | Status: DC
  Administered 2020-07-15 – 2020-07-19 (×5): 40 mg via ORAL
  Filled 2020-07-15 (×5): qty 1

## 2020-07-15 MED ORDER — ACETAMINOPHEN 650 MG RE SUPP: 650.0000 mg | Freq: Four times a day (QID) | RECTAL | Status: DC | PRN

## 2020-07-15 MED ORDER — VITAMINS A & D EX OINT: 1.0000 "application " | TOPICAL_OINTMENT | CUTANEOUS | 3 refills | Status: DC | PRN

## 2020-07-15 MED ORDER — ALBUTEROL SULFATE (2.5 MG/3ML) 0.083% IN NEBU
2.5000 mg | INHALATION_SOLUTION | Freq: Four times a day (QID) | RESPIRATORY_TRACT | Status: DC | PRN
  Filled 2020-07-15: qty 3

## 2020-07-15 MED ORDER — MAGNESIUM SULFATE 2 GM/50ML IV SOLN
2.0000 g | Freq: Once | INTRAVENOUS | Status: AC
  Administered 2020-07-15: 2 g via INTRAVENOUS
  Filled 2020-07-15: qty 50

## 2020-07-15 MED ORDER — SENNA 8.6 MG PO TABS
1.0000 | ORAL_TABLET | Freq: Two times a day (BID) | ORAL | Status: DC
  Administered 2020-07-15 – 2020-07-19 (×7): 8.6 mg via ORAL
  Filled 2020-07-15 (×8): qty 1

## 2020-07-15 MED ORDER — ACETAMINOPHEN 325 MG PO TABS
650.0000 mg | ORAL_TABLET | Freq: Four times a day (QID) | ORAL | Status: DC | PRN
  Administered 2020-07-15 – 2020-07-20 (×3): 650 mg via ORAL
  Filled 2020-07-15 (×4): qty 2

## 2020-07-15 MED ORDER — ROSUVASTATIN CALCIUM 20 MG PO TABS
40.0000 mg | ORAL_TABLET | Freq: Every day | ORAL | Status: DC
  Administered 2020-07-16 – 2020-07-19 (×4): 40 mg via ORAL
  Filled 2020-07-15 (×4): qty 2

## 2020-07-15 MED ORDER — GLUCERNA SHAKE PO LIQD
237.0000 mL | Freq: Three times a day (TID) | ORAL | Status: DC
  Administered 2020-07-15 – 2020-07-16 (×3): 237 mL via ORAL

## 2020-07-15 MED ORDER — INSULIN ASPART 100 UNIT/ML ~~LOC~~ SOLN
0.0000 [IU] | Freq: Three times a day (TID) | SUBCUTANEOUS | Status: DC
  Administered 2020-07-16: 2 [IU] via SUBCUTANEOUS
  Administered 2020-07-16: 3 [IU] via SUBCUTANEOUS
  Administered 2020-07-17: 2 [IU] via SUBCUTANEOUS
  Administered 2020-07-18 (×2): 3 [IU] via SUBCUTANEOUS
  Administered 2020-07-20: 5 [IU] via SUBCUTANEOUS
  Administered 2020-07-20: 3 [IU] via SUBCUTANEOUS

## 2020-07-15 MED ORDER — ONDANSETRON HCL 4 MG/2ML IJ SOLN: 4.0000 mg | Freq: Four times a day (QID) | INTRAMUSCULAR | Status: DC | PRN

## 2020-07-15 MED ORDER — ONDANSETRON HCL 4 MG PO TABS: 4.0000 mg | ORAL_TABLET | Freq: Four times a day (QID) | ORAL | Status: DC | PRN

## 2020-07-15 MED ORDER — ENSURE ENLIVE PO LIQD
237.0000 mL | Freq: Two times a day (BID) | ORAL | Status: DC
  Administered 2020-07-16 (×2): 237 mL via ORAL

## 2020-07-15 MED ORDER — PREDNISONE 50 MG PO TABS
60.0000 mg | ORAL_TABLET | Freq: Every day | ORAL | Status: DC
  Administered 2020-07-16 – 2020-07-17 (×2): 60 mg via ORAL
  Filled 2020-07-15 (×2): qty 1

## 2020-07-15 NOTE — H&P (Signed)
Date: 07/15/2020               Patient Name:  Haley Johnson MRN: [id number]  DOB: September 19, 1946 Age / Sex: 74 y.o., female   PCP: Alexandria Lodge, MD         Medical Service: Internal Medicine Teaching Service         Attending Physician: Dr. Aldine Contes, MD    First Contact: Dr. Fatima Sanger Pager: [phone]  Second Contact: Dr. Gilberto Better Pager: [phone]       After Hours (After 5p/  First Contact Pager: [phone]  weekends / holidays): Second Contact Pager: [phone]   Chief Complaint: Trouble Eating  History of Present Illness: Haley Johnson is a 74 yo F with PMHx of dermatomyositis, GERD, HTN, HLD, and Asthma presenting to [facility] w/ dysphagia. Due to weight loss, weakness, and a fall in the parking lot she was admitted. She states having difficulty with swallowing and eating for about 1 month. She states she coughs up her food because she cannot swallow and also have difficulty with chewing her food. She states that she also has sores in her mouth that cause her to have pain with chewing.   She also mentions having leg swelling for the last 2-3 years. Mentions worsening leg swelling especially since her last admission in June which causes her to have difficulty with ambulation. She also describes her 'muscles going out' due to sudden arm weakness. Earlier today she had fallen in the parking lot while coming to the clinic resulting in head trauma. She denies any loss of consciousness or seizure-like activities. States she fell as her legs just gave out.  She also recounts admission earlier this year for pelvic abscess which had resolved with antibiotics. She mentions some skin irritation due to her incontinence.   ROS: Positive for- generalized weakness, dyspnea on exertion and fatigue, diarrhea, and urinary incontinence  Negative for- fevers, headache, dizziness, dyspnea at rest, dysuria  Meds:  No outpatient medications have been marked as taking for the 07/15/20  encounter [facility] Encounter).     Allergies: Allergies as of 07/15/2020 - Review Complete 07/15/2020  Allergen Reaction Noted  . Chocolate Swelling 05/05/2020  . Blueberry flavor  06/19/2020  . Tomato  06/19/2020   Past Medical History:  Diagnosis Date  . Asthma   . Dermatomyositis ([place]) 05/05/2020  . Essential hypertension 05/05/2020  . GERD (gastroesophageal reflux disease)   . GERD without esophagitis 05/05/2020  . Hypercholesteremia   . Hypertension     Family History: Mother- deceased breast cancer Father- deceased complications from appendectomy 3 brothers- all have cancer, one has diabetes 1 daughter- deceased breast cancer 3 sons- healthy 7 grandchildren- healthy 2 great grandchildren- healthy  Social History: lives in [place] with her sons Reports feeling safe in the house Denies any alcohol, tobacco products, and illicit drug use  Review of Systems: A complete ROS was negative except as per HPI.   Physical Exam: Blood pressure 120/70, pulse 70, temperature 98.3 F (36.8 C), temperature source Oral, resp. rate 16, height 5\' 2"  (1.575 m), weight 61.9 kg, SpO2 98 %. Physical Exam Vitals and nursing note reviewed.  Constitutional:      General: She is not in acute distress.    Appearance: Normal appearance. She is not ill-appearing, toxic-appearing or diaphoretic.  HENT:     Head: Normocephalic.  Cardiovascular:     Rate and Rhythm: Normal rate and regular rhythm.     Pulses: Normal  pulses.          Radial pulses are 2+ on the right side and 2+ on the left side.       Dorsalis pedis pulses are 2+ on the right side and 2+ on the left side.     Heart sounds: Normal heart sounds, S1 normal and S2 normal. No murmur heard.   Abdominal:     General: There is no distension.     Palpations: Abdomen is soft.     Tenderness: There is no abdominal tenderness.  Musculoskeletal:     Right lower leg: 2+ Pitting Edema present.     Left lower leg: 2+ Pitting Edema  present.     Comments: Bilateral upper extremity 1+ edema  Neurological:     Mental Status: She is alert. Mental status is at baseline.     Motor: Weakness (global) present.     Comments: Strength Right Upper Extremity 4/5 Left Upper Extremity 3/5 Right Lower Extremity 3-/5 Left Lower Extremity 3+/5   Psychiatric:        Mood and Affect: Mood normal.        Behavior: Behavior normal.        Thought Content: Thought content normal.      EKG: Not ordered  CXR: Not ordered  Assessment & Plan by Problem: Active Problems:   Fall (on)(from) sidewalk curb, subsequent encounter   Haley Johnson is a 74 yo F with PMHx of dermatomyositis, GERD, HTN, HLD, and Asthma admitted for weakness and dysphagia.   Weakness secondary to Dermatomyositis flair: Has diagnosis of Dermatomyositis. Weakness has been getting worse in the last month. Will start steroids. Will have evaluation by PT -Start Prednisone 60 mg -PT consult   Dysphagia/Malnutrition: Has history of worsening appetite and difficulty eating. Has been coughing up food. Will have SLP evaluate for diet and any further workup. -Have SLP evaluate  -Check Liver panel -Consult Dietician for diet needs   Head Trauma: While getting out of car to go into Clinic fell and hit her head. CT ordered. No acute intracranial abnormality noted. -Continue to monitor for changes   Incontinence associated Dermatitis: Has multiple erythematous patches due to urinary incontinence. Will have Wound Care evaluate. -Wound Care recommendations appreciated   HTN: Currently BP within normal limits. Will hold home medications   HLD: Start home medications -Start Rosuvastatin 40 mg daily tomorrow   GERD: Start home medications -Start Pantoprazole 40 mg   Asthma: -Albuterol 1-2 puffs q6 PRN   Dispo: Admit patient to Observation with expected length of stay less than 2 midnights.  Signed: Briant Cedar, MD 07/15/2020, 6:41 PM  Pager:  [phone] After 5pm on weekdays and 1pm on weekends: On Call pager: [phone]

## 2020-07-15 NOTE — Assessment & Plan Note (Addendum)
Albumin 2.6 on most recent labs. Has had weight loss from 171lb in June to 155lb in July, now 158lb in clinic today. Has dysphagia thought secondary to dermatomyositis. Has appointment for swallow study on 9/2. Was prescribed Glucerna to increase protein intake, but has had difficulty swallowing this. Has been able to drink Ensures which are less thick. Also have pitting upper and lower extremity edema, likely due to albumin deficiency.  -admission for swallow evaluation and nutritional supplementation

## 2020-07-15 NOTE — Assessment & Plan Note (Addendum)
Patient with fall in parking lot while coming to the clinic. Reports feeling lightheaded and then her knee gave out. Son was with her and helped, but patient scraped her forehead. Does not think that she struck it hard.  Was recently started on torsemide for extremity swelling. BP of 92/53 in clinic today with previously normotensive readings. On exam has mild skin tenting. Also hx of dysphagia, malnutrition, and weight loss. Chronic diarrhea for 2 months contributing as well.   -admission to hospital to address hypotension -stop torsemide -hydrate gently

## 2020-07-15 NOTE — Progress Notes (Signed)
Report called to [facility] RN on 6N; accompanied by son and Therapist, sports. Walker w/pt.

## 2020-07-15 NOTE — Assessment & Plan Note (Signed)
Patient has had several months of LE edema, now complaining of new upper extremity edema for 1 month. During June admission, her LE edema improved with Lasix. Reports increased DOE and orthopnea, not SOB at rest. No known hx of heart failure or MI. Last echo on 6/13 with LVEF 28-41% and diastolic function indeterminate. Last alb 2.6. Difficulty with protein intake due to dysphagia.  After patient called for edema on 8/20, she was prescribed torsemide which she took over the weekend. Today presented with improvement in edema, but now having hypotension and fall. On exam, she has 2+ pitting edema in all 4 extremities. No crackles on lung exam.   -CMP -TEE limited by dysphagia -swallow study -nutritional support

## 2020-07-15 NOTE — Patient Instructions (Signed)
Thank you for allowing [place] to be a part of your care today, it was pleasure seeing you. We discussed your extremity edema, malnutrition, dysphagia, dehydration, and skin breakdown.  I have made these changes to your medications: Stop torsemide  We are admitting you to continue working on these problems.   Thank you, and please call the Internal Medicine Clinic at [phone] if you have any questions.  Best, Dr. Bridgett Larsson

## 2020-07-15 NOTE — Progress Notes (Signed)
   CC: extremity edema, fall, malnutrition, skin breakdown  HPI:  Haley Johnson is a 74 y.o. female with history as below presenting for extremity edema, fall, malnutrition, skin breakdown. Please refer to problem based charting for further details of assessment and plan of current problem and chronic medical conditions.  Past Medical History:  Diagnosis Date  . Asthma   . Dermatomyositis ([place]) 05/05/2020  . Essential hypertension 05/05/2020  . GERD (gastroesophageal reflux disease)   . GERD without esophagitis 05/05/2020  . Hypercholesteremia   . Hypertension    Review of Systems:   Review of Systems  Constitutional: Positive for malaise/fatigue and weight loss. Negative for chills and fever.  Respiratory: Negative for shortness of breath.   Cardiovascular: Positive for orthopnea and leg swelling. Negative for chest pain and palpitations.  Gastrointestinal: Positive for diarrhea.  Skin:       Skin breakdown  Neurological: Positive for dizziness and weakness. Negative for loss of consciousness and headaches.  Psychiatric/Behavioral: Negative for depression. The patient is not nervous/anxious.      Physical Exam: Vitals:   07/15/20 1347  BP: (!) 92/53  Pulse: 72  Temp: 98.1 F (36.7 C)  TempSrc: Oral  SpO2: 100%  Height: 5\' 2"  (1.575 m)   Constitutional: no acute distress, appears cachectic Head: 2x 28mm abrasions to the R lateral forehead ENT: external ears normal Cardiovascular: regular rate and rhythm, normal heart sounds, 2+ edema to all extremities Pulmonary: effort normal, normal breath sounds bilaterally, no crackles Abdominal: flat, nontender, no rebound tenderness, bowel sounds normal Skin: warm and dry, mild skin tenting Neurological: alert, no focal deficit Psychiatric: normal mood and affect  Assessment & Plan:   See Encounters Tab for problem based charting.  Patient seen with Dr. Rebeca Alert

## 2020-07-15 NOTE — Assessment & Plan Note (Signed)
Complaining of painful skin breakdown due to diaper use with concurrent diarrhea. Home health nurse helping with changes, but son also changing due to frequent requirement. Has been using Desitin but continues to worsen.  -address with wound care this admission -continue home health nurse -start A+D ointment

## 2020-07-15 NOTE — Assessment & Plan Note (Signed)
Patient has been getting home PT for the past 2 weeks which are helpful, but she is still severely deconditioned. States that she had recent spine imaging at [facility] here in [place] but is unsure of results. Patient has to wear a diaper because she cannot get up to the commode. She also has skin breakdown due to the diaper use, which home health nurse has been helping with. They are using desitin but it is not working.  -continue home PT -continue home health nurse -address with wound care consult this admission

## 2020-07-15 NOTE — Assessment & Plan Note (Signed)
Had antibiotics for pelvic abscess in June, negative C diff panel. Reports continued diarrhea since then. Patient is physically deconditioned so she has been going in a diaper, leading to skin breakdown.  -consider stool studies - viral panel, pancreatic elastase

## 2020-07-16 ENCOUNTER — Encounter (HOSPITAL_COMMUNITY): Payer: Self-pay | Admitting: Internal Medicine

## 2020-07-16 ENCOUNTER — Observation Stay (HOSPITAL_COMMUNITY): Payer: Medicare PPO

## 2020-07-16 DIAGNOSIS — R531 Weakness: Secondary | ICD-10-CM

## 2020-07-16 DIAGNOSIS — R1310 Dysphagia, unspecified: Secondary | ICD-10-CM | POA: Diagnosis not present

## 2020-07-16 DIAGNOSIS — R197 Diarrhea, unspecified: Secondary | ICD-10-CM | POA: Diagnosis not present

## 2020-07-16 DIAGNOSIS — J45909 Unspecified asthma, uncomplicated: Secondary | ICD-10-CM

## 2020-07-16 DIAGNOSIS — M3390 Dermatopolymyositis, unspecified, organ involvement unspecified: Secondary | ICD-10-CM | POA: Diagnosis not present

## 2020-07-16 DIAGNOSIS — E8809 Other disorders of plasma-protein metabolism, not elsewhere classified: Secondary | ICD-10-CM

## 2020-07-16 DIAGNOSIS — E785 Hyperlipidemia, unspecified: Secondary | ICD-10-CM

## 2020-07-16 DIAGNOSIS — E8779 Other fluid overload: Secondary | ICD-10-CM

## 2020-07-16 DIAGNOSIS — R600 Localized edema: Secondary | ICD-10-CM

## 2020-07-16 DIAGNOSIS — E46 Unspecified protein-calorie malnutrition: Secondary | ICD-10-CM

## 2020-07-16 LAB — HEMOGLOBIN A1C
Hgb A1c MFr Bld: 5.4 % (ref 4.8–5.6)
Mean Plasma Glucose: 108 mg/dL

## 2020-07-16 LAB — GLUCOSE, CAPILLARY
Glucose-Capillary: 72 mg/dL (ref 70–99)
Glucose-Capillary: 146 mg/dL — ABNORMAL HIGH (ref 70–99)
Glucose-Capillary: 158 mg/dL — ABNORMAL HIGH (ref 70–99)
Glucose-Capillary: 116 mg/dL — ABNORMAL HIGH (ref 70–99)

## 2020-07-16 LAB — BASIC METABOLIC PANEL
Anion gap: 9 (ref 5–15)
BUN: 26 mg/dL — ABNORMAL HIGH (ref 8–23)
CO2: 28 mmol/L (ref 22–32)
Calcium: 8.1 mg/dL — ABNORMAL LOW (ref 8.9–10.3)
Chloride: 96 mmol/L — ABNORMAL LOW (ref 98–111)
Creatinine, Ser: 0.99 mg/dL (ref 0.44–1.00)
GFR calc Af Amer: >60 mL/min (ref >60)
GFR calc non Af Amer: 57 mL/min — ABNORMAL LOW (ref >60)
Glucose, Bld: 100 mg/dL — ABNORMAL HIGH (ref 70–99)
Potassium: 3.3 mmol/L — ABNORMAL LOW (ref 3.5–5.1)
Sodium: 133 mmol/L — ABNORMAL LOW (ref 135–145)

## 2020-07-16 LAB — CBC
HCT: 28.9 % — ABNORMAL LOW (ref 36.0–46.0)
Hemoglobin: 9.4 g/dL — ABNORMAL LOW (ref 12.0–15.0)
MCH: 31.4 pg (ref 26.0–34.0)
MCHC: 32.5 g/dL (ref 30.0–36.0)
MCV: 96.7 fL (ref 80.0–100.0)
Platelets: 141 10*3/uL — ABNORMAL LOW (ref 150–400)
RBC: 2.99 MIL/uL — ABNORMAL LOW (ref 3.87–5.11)
RDW: 16.6 % — ABNORMAL HIGH (ref 11.5–15.5)
WBC: 7.4 10*3/uL (ref 4.0–10.5)
nRBC: 0 % (ref 0.0–0.2)

## 2020-07-16 MED ORDER — ADULT MULTIVITAMIN LIQUID CH
15.0000 mL | Freq: Every day | ORAL | Status: DC
  Administered 2020-07-17 – 2020-07-19 (×3): 15 mL via ORAL
  Filled 2020-07-16 (×5): qty 15

## 2020-07-16 MED ORDER — GERHARDT'S BUTT CREAM
TOPICAL_CREAM | Freq: Two times a day (BID) | CUTANEOUS | Status: DC
  Administered 2020-07-16 – 2020-07-17 (×2): 1 via TOPICAL
  Filled 2020-07-16: qty 1

## 2020-07-16 MED ORDER — ENSURE ENLIVE PO LIQD
237.0000 mL | Freq: Three times a day (TID) | ORAL | Status: DC
  Administered 2020-07-16 – 2020-07-18 (×4): 237 mL via ORAL

## 2020-07-16 NOTE — Consult Note (Signed)
[facility] Nurse wound consult note Consultation was completed by review of records, images and assistance from the bedside nurse/clinical staff.   Reason for Consult: sacrum Wound type: MASD (moisture associated skin damage) Reviewed nursing and MD documentation.  Pressure Injury POA: NA Dressing procedure/placement/frequency: Add Gerhardt's butt cream BID for affected areas exposed to urine and stool. And PRN after incontinence.    Re consult if needed, will not follow at this time. Thanks  Karah Caruthers [employer], RN,CWOCN, CNS, [place] [phone])

## 2020-07-16 NOTE — Care Plan (Signed)
I reviewed case with primary team about patient's dysphagia and trouble swallowing.  Based on Speech Therapy report, it appears most likely oropharyngeal dysphagia perhaps in turn exacerbated/caused by her dermatomyositis flare.  To exclude other esophageal causes of dysphagia, I have advised primary team order WATER SOLUBLE esophagram (if technically feasible) to assess the esophagus, and unless significant finding there, I would not pursue any further GI tract work-up at the present time.

## 2020-07-16 NOTE — Progress Notes (Signed)
Internal Medicine Clinic Attending  I saw and evaluated the patient.  I personally confirmed the key portions of the history and exam documented by Dr. Bridgett Larsson and I reviewed pertinent patient test results.  The assessment, diagnosis, and plan were formulated together and I agree with the documentation in the resident's note.  74 yo woman here for follow up. Unfortunately, has not been doing well--poor appetite, difficulty swallowing, requiring 24 hour care from her sons. Trying to work with PT and getting some help from home care nurse, but continues to have diarrhea and bad diaper dermatitis. Has been feeling weak, fell coming to clinic today.   On exam, she appears cachectic and frail but with significant edema involving both legs and right arm. MM are moist, JVP is elevated to the midneck sitting upright. No obvious injuries from fall. GU exam deferred.  With her progressive weakness, ongoing diarrhea, fall, decline in function, and progressive dysphagia, she is not safe to go home. Also very difficult to manage fluid balance as she total body volume overloaded but very unsteady on her feet and potentially orthostatic after aggressive diuresis to work on her anasarca. Her edema is likely multifactorial from poor nutrition and heart failure, there may be a degree of protein losing enteropathy from her ongoing diarrhea. Her weakness could be due to a flair of her dermatomyositis or malnutrition and orthostasis.   She will benefit from SLP evaluation and likely MBS, working on nutrition and wound care, careful titration of her fluid status, and potentially discharge to rehab to improve her functional status before returning home.   Lenice Pressman, M.D., Ph.D.

## 2020-07-16 NOTE — Evaluation (Signed)
Occupational Therapy Evaluation Patient Details Name: Haley Johnson MRN: [id number] DOB: [birth year] Today's Date: 07/16/2020    History of Present Illness 74 yo F with presenting to [facility] w/ dysphagia. Due to weight loss, weakness, and a fall in the parking lot she was admitted PMH: asthma dermatomyositis GERD HTN HLD hypercholesteremia    Clinical Impression   PT admitted with fall. Pt currently with functional limitiations due to the deficits listed below (see OT problem list). Pt was walking with RW prior to admission and now requires total +2 mod (A) for sit<>stand. Pt with decreased Bil UE shoulder flexion deficits and reports full ROM prior to admission.  Pt will benefit from skilled OT to increase their independence and safety with adls and balance to allow discharge CIR.     Follow Up Recommendations  CIR    Equipment Recommendations  Wheelchair (measurements OT);Wheelchair cushion (measurements OT)    Recommendations for Other Services Rehab consult     Precautions / Restrictions Precautions Precautions: Fall Precaution Comments: wounds in peri area      Mobility Bed Mobility Overal bed mobility: Needs Assistance Bed Mobility: Rolling;Supine to Sit;Sit to Supine Rolling: Min assist   Supine to sit: +2 for physical assistance;Mod assist Sit to supine: +2 for physical assistance;Mod assist   General bed mobility comments: pt initiates log rolling with hip and head but unable to lift shoulders to initiate arms. once hand placed over belly on rail attempting to pull. pt requires (A) To elevate trunk from bed surface with minimal UE (A) due to weakness. pt static sitting with ming (A) <>mod (A).   Transfers Overall transfer level: Needs assistance Equipment used: Rolling walker (2 wheeled) Transfers: Sit to/from Stand Sit to Stand: +2 physical assistance;Mod assist         General transfer comment: pt with bil LE blocked initially and progressed  to guarding with knees extended chest anterior to attempt to compensate for basic deficits. pt when asked to step toward [facility] pt uses heel to toe wiggle to move feet. pt does not flex knees adn reports fear her legs will not hold her if she does not keep knees extended    Balance Overall balance assessment: Needs assistance Sitting-balance support: Bilateral upper extremity supported;Feet supported Sitting balance-Leahy Scale: Poor     Standing balance support: Bilateral upper extremity supported;During functional activity Standing balance-Leahy Scale: Zero Standing balance comment: reliants on therapists and external support                           ADL either performed or assessed with clinical judgement   ADL Overall ADL's : Needs assistance/impaired Eating/Feeding: Bed level;Maximal assistance Eating/Feeding Details (indicate cue type and reason): decrease grasp- could benefit from further assessment of AE for hands / handled cup . currentl unable to complete shoulder flexion for any self feeding Grooming: Maximal assistance   Upper Body Bathing: Maximal assistance   Lower Body Bathing: Total assistance       Lower Body Dressing: Total assistance Lower Body Dressing Details (indicate cue type and reason): don socks               General ADL Comments: eob sitting and attempting to set bed to chair position but unablet o do so due to bed issue     Vision   Vision Assessment?: No apparent visual deficits     Perception     Praxis  Pertinent Vitals/Pain Pain Assessment: No/denies pain Pain Score: 3  Pain Location: back Pain Descriptors / Indicators: Aching Pain Intervention(s): Limited activity within patient's tolerance;Monitored during session     Hand Dominance Right   Extremity/Trunk Assessment Upper Extremity Assessment Upper Extremity Assessment: RUE deficits/detail;Generalized weakness;LUE deficits/detail RUE Deficits / Details: 1/5  shoulder flexion, noted to have AROM full elbow and edema at forearm near elbow. pt states "so strange i can't lift my arm today but normally i can" pt with decrease grasp  RUE Coordination: decreased gross motor;decreased fine motor LUE Deficits / Details: 1/5 shoulder flexion noted to have AROM elbow full with edema at elbow present  LUE Coordination: decreased fine motor;decreased gross motor   Lower Extremity Assessment Lower Extremity Assessment: Defer to PT evaluation   Cervical / Trunk Assessment Cervical / Trunk Assessment: Kyphotic   Communication Communication Communication: HOH   Cognition Arousal/Alertness: Awake/alert Behavior During Therapy: WFL for tasks assessed/performed Overall Cognitive Status: Within Functional Limits for tasks assessed                                     General Comments  risk for skin break down and noted to have some break down in peri area baseline. pt with barrier cream present on skin     Exercises     Shoulder Instructions      Home Living Family/patient expects to be discharged to:: Private residence Living Arrangements: Other relatives;Children Available Help at Discharge: Family;Available PRN/intermittently;Available 24 hours/day Type of Home: House Home Access: Level entry   Entrance Stairs-Rails:  (holds the porch) Home Layout: One level     Bathroom Shower/Tub: Teacher, early years/pre: Standard Bathroom Accessibility: No   Home Equipment: Environmental consultant - 2 wheels;Cane - single point   Additional Comments: pt has aides T/Th 1 hour, PT and OT-- pt will refer to her home unless you ask her where she is living currenlty which is Son's home that is listed in the chart      Prior Functioning/Environment Level of Independence: Needs assistance  Gait / Transfers Assistance Needed: uses RW ADL's / Homemaking Assistance Needed: Aide helps pt wash up, son helps her dress            OT Problem List:  Decreased strength;Decreased activity tolerance;Impaired balance (sitting and/or standing);Decreased knowledge of use of DME or AE;Decreased knowledge of precautions;Impaired UE functional use      OT Treatment/Interventions: Self-care/ADL training;Therapeutic exercise;Neuromuscular education;DME and/or AE instruction;Therapeutic activities;Patient/family education;Balance training    OT Goals(Current goals can be found in the care plan section) Acute Rehab OT Goals Patient Stated Goal: to be able to move arms again OT Goal Formulation: With patient Time For Goal Achievement: 07/30/20 Potential to Achieve Goals: Good  OT Frequency: Min 2X/week   Barriers to D/C: Decreased caregiver support  reports being home alone for less than 2 hours or even less at a time. "if they need to run to the store and back"       Co-evaluation PT/OT/SLP Co-Evaluation/Treatment: Yes Reason for Co-Treatment: Necessary to address cognition/behavior during functional activity;For patient/therapist safety;To address functional/ADL transfers   OT goals addressed during session: ADL's and self-care;Strengthening/ROM      AM-PAC OT "6 Clicks" Daily Activity     Outcome Measure Help from another person eating meals?: A Lot Help from another person taking care of personal grooming?: A Lot Help from another person toileting,  which includes using toliet, bedpan, or urinal?: Total Help from another person bathing (including washing, rinsing, drying)?: Total Help from another person to put on and taking off regular upper body clothing?: Total Help from another person to put on and taking off regular lower body clothing?: Total 6 Click Score: 8   End of Session Nurse Communication: Mobility status;Precautions  Activity Tolerance: Patient tolerated treatment well Patient left: in bed;with call bell/phone within reach;Other (comment) (requested soft touch from unit and calling to acquire it)  OT Visit Diagnosis:  Unsteadiness on feet (R26.81);Muscle weakness (generalized) (M62.81)                Time: 2119-4174 OT Time Calculation (min): 31 min Charges:  OT General Charges $OT Visit: 1 Visit OT Evaluation $OT Eval Moderate Complexity: 1 Mod   Brynn, OTR/L  Acute Rehabilitation Services Pager: [phone] Office: [phone] .   Jeri Modena 07/16/2020, 2:17 PM

## 2020-07-16 NOTE — Progress Notes (Signed)
  Date: 07/16/2020  Patient name: Haley Johnson  Medical record number: [id number]  Date of birth: May 26, 1946   I have seen and evaluated Haley Johnson and discussed their care with the Residency Team.  In brief, patient is 74 year old female with a past medical history of dermatomyositis, GERD, hypertension, hyperlipidemia and asthma who presented to [facility] with worsening dysphagia over the last month.  Patient states that over the last month she has noted worsening weakness, weight loss and dysphagia.  Patient is also noted worsening swelling in her legs over the last month.  Patient also had a fall in the parking lot prior to being seen in internal medicine clinic.  No syncope or lightheadedness.  Patient also complains of chronic diarrhea and dermatitis secondary to this.  No chest pain, no shortness of breath, no palpitations, no focal weakness, no tingling or numbness, no fevers or chills.  Patient states that she feels like food gets caught in her chest after she eats.  Today, patient continues to complain of weakness difficulty swallowing.  PMHx, Fam Hx, and/or Soc Hx : As per resident admit note  Vitals:   07/16/20 0134 07/16/20 0504  BP: (!) 99/55 (!) 94/58  Pulse: 69 71  Resp: 17 15  Temp: 97.8 F (36.6 C) (!) 97.3 F (36.3 C)  SpO2: 96% 98%   General: Awake, alert, oriented x3, NAD CVS: Regular rhythm, normal heart sounds Lungs: CTA bilaterally Abdomen: Soft, nontender, nondistended, normoactive bowel sounds Extremities: 2+ bilateral lower extremity pitting edema noted, nontender to palpation Psych: Normal mood and affect HEENT: Normocephalic, atraumatic Skin: Warm and dry  Assessment and Plan: I have seen and evaluated the patient as outlined above. I agree with the formulated Assessment and Plan as detailed in the residents' note, with the following changes:   1.  Worsening weakness/dysphagia likely secondary to underlying dermatomyositis flare: -Patient  presented to Haley Johnson with worsening weakness, dysphagia and ongoing diarrhea over the last month.  I suspect that her worsening weakness and dysphagia is secondary to underlying dermatomyositis flare. -SLP follow-up and recommendations appreciated.  Patient status post modified barium swallow which showed significant dysfunction while swallowing.  We will continue with full liquid diet for now. -Case discussed with GI who recommended obtaining water-soluble esophagram to assess esophagus.  If there is no significant finding no further work-up is required at this time. -Continue with prednisone 60 mg daily for likely dermatomyositis flare -PT/OT recommending CIR on discharge -Patient will need outpatient follow-up with rheumatology -We will continue to monitor closely for now  2.  Fluid overload in the setting of hypoalbuminemia and possible diastolic heart failure exacerbation: -Patient was noted had a mildly elevated BNP of 180 and was fluid overloaded with 2+ bilateral lower extremity pitting edema.  She was also noted to be hypoalbuminemic with an albumin of 2.2 in the setting of decreased oral intake over the last month -Patient blood pressures remain borderline with SBP's in the 90s.  I suspect that she is intravascularly depleted.  We will continue to monitor closely and address her nutritional needs. -We will also consider gentle diuresis if her fluid overload worsens  Aldine Contes, MD 8/24/20212:58 PM

## 2020-07-16 NOTE — Social Work (Signed)
TOC team will follow for therapy recommendations.  Aware pt fell going to IMTS clinic today.   Westley Hummer, MSW, [place] Work

## 2020-07-16 NOTE — Progress Notes (Addendum)
   Subjective: Ms. Becknell reports doing well overnight, aside from some new-onset "jumpiness" in her arms. She continues to have global weakness and difficulty swallowing, but was able to take her breakfast feeding supplement. Aside from that, her weakness is essentially unchanged from yesterday. She was seen by Speech Therapy this morning, who recommended further workup of her dysphagia.   Objective:  Vital signs in last 24 hours: Vitals:   07/15/20 1755 07/15/20 2040 07/16/20 0134 07/16/20 0504  BP: 120/70 (!) 91/49 (!) 99/55 (!) 94/58  Pulse: 70 72 69 71  Resp: 16 16 17 15   Temp: 98.3 F (36.8 C) 98 F (36.7 C) 97.8 F (36.6 C) (!) 97.3 F (36.3 C)  TempSrc: Oral Oral Oral Oral  SpO2:  100% 96% 98%  Weight: 61.9 kg     Height: 5\' 2"  (1.575 m)      Physical Exam: Physical Exam Vitals and nursing note reviewed.  Constitutional:      General: She is not in acute distress.    Appearance: Normal appearance. She is not diaphoretic.  Cardiovascular:     Rate and Rhythm: Normal rate and regular rhythm.     Pulses:          Dorsalis pedis pulses are 2+ on the right side and 2+ on the left side.       Posterior tibial pulses are 2+ on the right side and 2+ on the left side.     Heart sounds: S1 normal and S2 normal.  Pulmonary:     Breath sounds: Rhonchi (worse at lung bases) present.  Abdominal:     General: Bowel sounds are normal. There is no distension.  Musculoskeletal:     Comments: 2+ pitting edema present in LE bilaterally 1+ pitting edema in UE bilaterally  Neurological:     Mental Status: She is alert.  Psychiatric:        Mood and Affect: Mood normal.        Thought Content: Thought content normal.        Judgment: Judgment normal.      Intake/Output Summary (Last 24 hours) at 07/16/2020 1056 Last data filed at 07/16/2020 0934 Gross per 24 hour  Intake 1638.48 ml  Output 1100 ml  Net 538.48 ml     Assessment/Plan:  Dermatomyositis: Started on Prednisone  60mg /day. I expect that her "jumpiness" is the result of these high-dose glucocorticoids. We will continue to monitor her strength with the expectation that she should see improvement within a few days on high-dose prednisone.   Dysphagia: While this is likely related to her dermatomyositis flare, other etiologies cannot be excluded without further workup. -SLP modified barium swallow -Will consult GI for possible esophogram -Full liquid diet per SLP recommendation with supplementation of Ensure and Glucerna  Skin erosions: -Awaiting wound care consult. Continue with Gerhardt's butt cream for now.    LOS: 1 day   Pearla Dubonnet, Medical Student 07/16/2020, 10:56 AM

## 2020-07-16 NOTE — Progress Notes (Addendum)
Modified Barium Swallow Progress Note  Patient Details  Name: Haley Johnson MRN: [id number] Date of Birth: February 09, 1946  Today's Date: 07/16/2020  Modified Barium Swallow completed.  Full report located under Chart Review in the Imaging Section.  Brief recommendations include the following:  Clinical Impression Pt seen in radiology for inpatient MBS. She was scheduled for outpatient MBS 07/25/20, however, given presentation at bedside the decision was made to complete the study today.  Pt presents with significant pharyngeal dysphagia, characterized by trigger of swallow reflex at the level of the pyriform sinuses across consistencies. Pt exhibits reduced tongue base retraction, poor laryngeal elevation and anterior hyoid excursion, minimal epiglottic movement, and poor pharyngeal stripping. Pt also appears to have poor relaxation of the cricopharyngeal muscle. These deficits raise concern for issues with cranial nerves V, IX, X, and XII, in addition to the spinal nerve at C1.   Functionally, pt exhibits poor clearing and significant vallecular and pyriform sinus residue, which was noted to penetrate the laryngeal vestibule after the swallow. Pt is sensate to this penetrate, and performs multiple dry swallows and frequent throat clearing to attempt removal of penetrate and residue. She is at significant risk for aspiration and inadequate nutrition with current swallow function.  Esophageal sweep revealed it to be clear.   Full liquid diet is recommended at this time, with meds in liquid form if possible. Thicker consistencies were noted to cause more residue than thinner consistencies, and were more resistant to clear. ST will continue to follow pt to provide strengthening exercises. The following exercises may be beneficial: Effortful swallow, Mendelsohn Maneuver, Tongue pull back, CTAR, Shaker. Pt may also benefit from EMST (expiratory muscle strength trainer). Recommend continued ST  intervention at next level of care to maximize safe and functional swallow.    Swallow Evaluation Recommendations  SLP Diet Recommendations: Full liquid   Liquid Administration via: Cup;Straw   Medication Administration: liquid form if possible   Supervision: Staff to assist with self feeding   Compensations: Slow rate;Small sips/bites;Multiple dry swallows after each bite/sip;Clear throat intermittently   Postural Changes: Remain semi-upright after after feeds/meals (Comment);Seated upright at 90 degrees   Oral Care Recommendations: Oral care QID  Haley Johnson, [facility], [place] Speech Language Pathologist Office: [phone] Pager: [phone]  Shonna Chock 07/16/2020,11:38 AM

## 2020-07-16 NOTE — Progress Notes (Signed)
Rehab Admissions Coordinator Note:  Patient was screened by Cleatrice Burke for appropriateness for an Inpatient Acute Rehab Consult per therapy recs..  At this time, we are recommending Inpatient Rehab consult. I will contact MD for consult order.  Cleatrice Burke RN MSN 07/16/2020, 2:58 PM  I can be reached at [phone].

## 2020-07-16 NOTE — Care Management (Addendum)
Patient currently off unit.    Called Son Lakhia Gengler [phone] no answer.   Called son Franny Selvage [phone] and left voicemail.   Magdalen Spatz RN

## 2020-07-16 NOTE — Progress Notes (Signed)
OT Cancellation Note  Patient Details Name: Haley Johnson MRN: [id number] DOB: [birth year]   Cancelled Treatment:    Reason Eval/Treat Not Completed: Patient at procedure or test/ unavailable (MBS)  Billey Chang, OTR/L  Acute Rehabilitation Services Pager: [phone] Office: [phone] .  07/16/2020, 9:55 AM

## 2020-07-16 NOTE — Progress Notes (Signed)
Initial Nutrition Assessment  RD working remotely.  DOCUMENTATION CODES:   Not applicable  INTERVENTION:   -D/c Glucerna Shake po TID, each supplement provides 220 kcal and 10 grams of protein -Ensure Enlive po TID, each supplement provides 350 kcal and 20 grams of protein -Hormel Shake TID with meals, each supplement provides 520 kcals and 22 grams protein -Magic cup TID with meals, each supplement provides 290 kcal and 9 grams of protein -15 ml liquid MVI daily -If prolonged full liquid diet is anticipated, pt may require supplemental nutrition support (ex enteral nutrition) to help meet nutritional goals  NUTRITION DIAGNOSIS:   Inadequate oral intake related to dysphagia as evidenced by meal completion < 50%, percent weight loss.  GOAL:   Patient will meet greater than or equal to 90% of their needs  MONITOR:   PO intake, Supplement acceptance, Diet advancement, Labs, Weight trends, Skin, I & O's  REASON FOR ASSESSMENT:   Malnutrition Screening Tool    ASSESSMENT:   Haley Johnson is a 74 yo F with PMHx of dermatomyositis, GERD, HTN, HLD, and Asthma presenting to [facility] w/ dysphagia. Due to weight loss, weakness, and a fall in the parking lot she was admitted. She states having difficulty with swallowing and eating for about 1 month. She states she coughs up her food because she cannot swallow and also have difficulty with chewing her food. She states that she also has sores in her mouth that cause her to have pain with chewing  Pt admitted with weakness and dysphagia.   8/24- s/p MBSS- advanced to full liquid diet  Attempted to speak with pt via call to hospital room phone, however, no answer.   Per SLP notes, recommendations for full liquid diet with medications in liquid form if possible. Thicker consistencies caused more residue than thinner consistencies.   GI recommending water soluble esophagram to assess the esophagus. GI suspects oropharyngeal dysphagia  exacerbated by dermatomyositis flare.   Per chart review, pt has had dysphagia and weakness over the past month.   Reviewed wt hx; pt has experienced a 12.1% wt loss over the past month, which is significant for time frame.   Highly suspect pt with malnutrition, however, RD unable to identify at this time without completion of nutrition-focused physical exam.  Medications reviewed and include prednisone and senokot.   Labs reviewed: Na: 133, K: 3.3, CBGS: 72.   Diet Order:   Diet Order            Diet full liquid Room service appropriate? Yes; Fluid consistency: Thin  Diet effective now                 EDUCATION NEEDS:   No education needs have been identified at this time  Skin:  Skin Assessment: Skin Integrity Issues: Skin Integrity Issues:: Other (Comment) Other: MASD labia  Last BM:  07/16/20  Height:   Ht Readings from Last 1 Encounters:  07/15/20 5\' 2"  (1.575 m)    Weight:   Wt Readings from Last 1 Encounters:  07/15/20 61.9 kg    Ideal Body Weight:  50 kg  BMI:  Body mass index is 24.95 kg/m.  Estimated Nutritional Needs:   Kcal:  1850-2050  Protein:  95-110 grams  Fluid:  > 1.8 L    Haley Johnson, RD, LDN, [place] Registered Dietitian II Certified Diabetes Care and Education Specialist Please refer to [facility] for RD and/or RD on-call/weekend/after hours pager

## 2020-07-16 NOTE — Evaluation (Signed)
Physical Therapy Evaluation Patient Details Name: Haley Johnson MRN: [id number] DOB: 1946/04/29 Today's Date: 07/16/2020   History of Present Illness  74 yo F with presenting to [facility] w/ dysphagia. Due to weight loss, weakness, and a fall in the parking lot she was admitted to [facility] on 8/23. PMH: asthma, dermatomyositis, GERD, HTN, HLD, hypercholesteremia  Clinical Impression   Pt presents with body-wide weakness, UEs>LEs with truncal and neck flexor involvement, poor sitting and standing balance, difficulty performing mobility tasks, inability to ambulate on evaluation due to LE weakness, and decreased activity tolerance vs baseline. Pt to benefit from acute PT to address deficits. Pt required mod-max +2 for bed mobility, transfer to stand, and to take lateral steps towards [facility] with bilateral support. Pt very reluctant to offweight LEs during lateral stepping, so takes very shuffling steps. At baseline, pt ambulates household distances. PT recommending CIR to maximize pt functional recovery. Pt has great family support, as both sons assist pt as needed and provide near 24/7 care. PT to progress mobility as tolerated, and will continue to follow acutely.      Follow Up Recommendations CIR    Equipment Recommendations  Other (comment) (defer)    Recommendations for Other Services       Precautions / Restrictions Precautions Precautions: Fall Precaution Comments: wounds in peri area Restrictions Weight Bearing Restrictions: No      Mobility  Bed Mobility Overal bed mobility: Needs Assistance Bed Mobility: Rolling;Supine to Sit;Sit to Supine Rolling: Min assist   Supine to sit: +2 for physical assistance;Mod assist Sit to supine: +2 for physical assistance;Mod assist   General bed mobility comments: pt initiates log rolling with hip and head but unable to lift shoulders to initiate arms. once hand placed over belly on rail attempting to pull. pt requires (A) To  elevate trunk from bed surface with minimal UE (A) due to weakness. pt static sitting with ming (A) <>mod (A).   Transfers Overall transfer level: Needs assistance Equipment used: Rolling walker (2 wheeled) Transfers: Sit to/from Stand Sit to Stand: +2 physical assistance;Mod assist         General transfer comment: pt with bil LE blocked initially and progressed to guarding with knees extended chest anterior to attempt to compensate for basic deficits. pt when asked to step toward [facility] pt uses heel to toe wiggle to move feet. pt does not flex knees adn reports fear her legs will not hold her if she does not keep knees extended  Ambulation/Gait                Stairs            Wheelchair Mobility    Modified Rankin (Stroke Patients Only)       Balance Overall balance assessment: Needs assistance Sitting-balance support: Bilateral upper extremity supported;Feet supported Sitting balance-Leahy Scale: Poor     Standing balance support: Bilateral upper extremity supported;During functional activity Standing balance-Leahy Scale: Zero Standing balance comment: reliants on therapists and external support                             Pertinent Vitals/Pain Pain Assessment: No/denies pain Pain Score: 3  Pain Location: back Pain Descriptors / Indicators: Aching Pain Intervention(s): Limited activity within patient's tolerance;Monitored during session    Home Living Family/patient expects to be discharged to:: Private residence Living Arrangements: Other relatives;Children Available Help at Discharge: Family;Available PRN/intermittently;Available 24 hours/day Type of Home:  House Home Access: Level entry Entrance Stairs-Rails:  (holds the porch)   Home Layout: One level Home Equipment: [place] - 2 wheels;Cane - single point Additional Comments: pt has aides T/Th 1 hour, PT and OT-- pt will refer to her home unless you ask her where she is living currenlty  which is Son's home that is listed in the chart    Prior Function Level of Independence: Needs assistance   Gait / Transfers Assistance Needed: uses RW  ADL's / Homemaking Assistance Needed: Aide helps pt wash up, son helps her dress        Hand Dominance   Dominant Hand: Right    Extremity/Trunk Assessment   Upper Extremity Assessment Upper Extremity Assessment: Defer to OT evaluation RUE Deficits / Details: 1/5 shoulder flexion, noted to have AROM full elbow and edema at forearm near elbow. pt states "so strange i can't lift my arm today but normally i can" pt with decrease grasp  RUE Coordination: decreased gross motor;decreased fine motor LUE Deficits / Details: 1/5 shoulder flexion noted to have AROM elbow full with edema at elbow present  LUE Coordination: decreased fine motor;decreased gross motor    Lower Extremity Assessment Lower Extremity Assessment: Generalized weakness    Cervical / Trunk Assessment Cervical / Trunk Assessment: Kyphotic  Communication   Communication: HOH  Cognition Arousal/Alertness: Awake/alert Behavior During Therapy: WFL for tasks assessed/performed Overall Cognitive Status: Within Functional Limits for tasks assessed                                        General Comments General comments (skin integrity, edema, etc.): truncal and neck flexor weakness noted; extends head when asked to perform chin tuck for pillow adjustment    Exercises     Assessment/Plan    PT Assessment Patient needs continued PT services  PT Problem List Decreased strength;Decreased mobility;Decreased range of motion;Decreased activity tolerance;Decreased balance;Decreased knowledge of use of DME;Decreased coordination;Decreased safety awareness;Impaired tone       PT Treatment Interventions DME instruction;Therapeutic activities;Gait training;Therapeutic exercise;Patient/family education;Balance training;Stair training;Functional mobility  training;Neuromuscular re-education    PT Goals (Current goals can be found in the Care Plan section)  Acute Rehab PT Goals Patient Stated Goal: return to walking/PLOF PT Goal Formulation: With patient Time For Goal Achievement: 07/30/20 Potential to Achieve Goals: Good    Frequency Min 3X/week   Barriers to discharge        Co-evaluation PT/OT/SLP Co-Evaluation/Treatment: Yes Reason for Co-Treatment: For patient/therapist safety;To address functional/ADL transfers;Complexity of the patient's impairments (multi-system involvement) PT goals addressed during session: Mobility/safety with mobility;Balance OT goals addressed during session: ADL's and self-care;Strengthening/ROM       AM-PAC PT "6 Clicks" Mobility  Outcome Measure Help needed turning from your back to your side while in a flat bed without using bedrails?: A Lot Help needed moving from lying on your back to sitting on the side of a flat bed without using bedrails?: A Lot Help needed moving to and from a bed to a chair (including a wheelchair)?: A Lot Help needed standing up from a chair using your arms (e.g., wheelchair or bedside chair)?: A Lot Help needed to walk in hospital room?: Total Help needed climbing 3-5 steps with a railing? : Total 6 Click Score: 10    End of Session Equipment Utilized During Treatment: Gait belt Activity Tolerance: Patient limited by fatigue Patient left: in bed;with  call bell/phone within reach;with bed alarm set;Other (comment) (bed in chair position) Nurse Communication: Mobility status PT Visit Diagnosis: Muscle weakness (generalized) (M62.81);Difficulty in walking, not elsewhere classified (R26.2);History of falling (Z91.81)    Time: 5320-2334 PT Time Calculation (min) (ACUTE ONLY): 31 min   Charges:   PT Evaluation $PT Eval Low Complexity: 1 Low          Emelyn Roen E, PT Acute Rehabilitation Services Pager [phone]  Office [phone]   Cyntha Brickman D Elonda Husky 07/16/2020, 2:31  PM

## 2020-07-16 NOTE — TOC Initial Note (Signed)
Transition of Care [facility]) - Initial/Assessment Note    Patient Details  Name: Haley Johnson MRN: [id number] Date of Birth: 1946/10/23  Transition [facility] CM/SW Contact:    Marilu Favre, RN Phone Number: 07/16/2020, 4:35 PM  Clinical Narrative:                  Spoke to patient's son Haley Johnson [phone] via phone. Patient from home. Patient's son Haley Johnson stays with her at night and Haley Johnson stays with her during the day. She currently has no DME at home.   Discussed PT recommendation for inpatient rehab. Haley Johnson in agreement but understands , awaiting REHAB MD referral and would need insurance authorization.   Haley Johnson voiced understanding. Will continue to follow Expected Discharge Plan: IP Rehab Facility Barriers to Discharge: Continued Medical Work up   Patient Goals and CMS Choice        Expected Discharge Plan and Services Expected Discharge Plan: [place]   Discharge Planning Services: CM Consult   Living arrangements for the past 2 months: Single Family Home                                      Prior Living Arrangements/Services Living arrangements for the past 2 months: Single Family Home Lives with:: Adult Children Patient language and need for interpreter reviewed:: Yes        Need for Family Participation in Patient Care: Yes (Comment) Care giver support system in place?: Yes (comment)   Criminal Activity/Legal Involvement Pertinent to Current Situation/Hospitalization: No - Comment as needed  Activities of Daily Living Home Assistive Devices/Equipment: Dentures (specify type), Eyeglasses, Shower chair with back, Raised toilet seat with rails, Walker (specify type) ADL Screening (condition at time of admission) Patient's cognitive ability adequate to safely complete daily activities?: Yes Is the patient deaf or have difficulty hearing?: No Does the patient have difficulty seeing, even when wearing glasses/contacts?:  No Does the patient have difficulty concentrating, remembering, or making decisions?: No Patient able to express need for assistance with ADLs?: Yes Does the patient have difficulty dressing or bathing?: Yes Independently performs ADLs?: Yes (appropriate for developmental age) Does the patient have difficulty walking or climbing stairs?: Yes Weakness of Legs: Both Weakness of Arms/Hands: None  Permission Sought/Granted                  Emotional Assessment         Alcohol / Substance Use: Not Applicable Psych Involvement: No (comment)  Admission diagnosis:  Fall at home, initial encounter [W19.Merril Abbe, B34.193] Patient Active Problem List   Diagnosis Date Noted  . Fall at home, initial encounter 07/15/2020  . Fall (on)(from) sidewalk curb, subsequent encounter 07/15/2020  . Skin breakdown 07/15/2020  . Diarrhea 05/30/2020  . Physical deconditioning 05/30/2020  . Pressure injury of skin 05/08/2020  . Postherpetic neuralgia 05/06/2020  . Pelvic abscess in female 05/05/2020  . Edema of all four extremities 05/05/2020  . Lactic acidosis 05/05/2020  . Essential hypertension 05/05/2020  . GERD without esophagitis 05/05/2020  . Dermatomyositis ([place]) 05/05/2020  . Hypoalbuminemia due to protein-calorie malnutrition ([place]) 05/05/2020  . Mixed hyperlipidemia 05/05/2020   PCP:  Alexandria Lodge, MD Pharmacy:   [facility] [address] [place] [address] Phone: [phone] Fax: [facility] #[id number] [place], [place] - [address] [place] [address] Phone: [phone] Fax: [phone]     Social Determinants of Health (SDOH) Interventions    Readmission Risk Interventions No flowsheet data found.

## 2020-07-16 NOTE — Evaluation (Signed)
Clinical/Bedside Swallow Evaluation Patient Details  Name: Haley Johnson MRN: [id number] Date of Birth: [birth year]  Today's Date: 07/16/2020 Time: SLP Start Time (ACUTE ONLY): 0900 SLP Stop Time (ACUTE ONLY): 0920 SLP Time Calculation (min) (ACUTE ONLY): 20 min  Past Medical History:  Past Medical History:  Diagnosis Date  . Asthma   . Dermatomyositis ([place]) 05/05/2020  . Essential hypertension 05/05/2020  . GERD (gastroesophageal reflux disease)   . GERD without esophagitis 05/05/2020  . Hypercholesteremia   . Hypertension    Past Surgical History: History reviewed. No pertinent surgical history. HPI:  74yo female admitted 07/15/20 with dysphagia x1 month PMH: dermatomyositis, GERD, HTN, HLD, asthma   Assessment / Plan / Recommendation Clinical Impression  Pt was seen at bedside for assessment of swallow function. CN exam unremarkable. Pt has upper dentures, and is missing several lower teeth. Chart review reveals pt was scheduled for outpatient MBS 07/25/20. Pt reports esophageal dilation in 2019, but feels it needs to be done again. She reports globus sensation with all intake, but also exhibits congested cough after the swallow of all po trials. Given history and current presentation, will proceed to MBS this morning to objectively assess oral and pharyngeal swallow. Given history of esophageal issues, will pan the esophagus during the study to identify need for further esophageal work up. RN and MD informed.   SLP Visit Diagnosis: Dysphagia, unspecified (R13.10)    Aspiration Risk  Moderate aspiration risk    Diet Recommendation Pending MBS results       Follow up Recommendations Other (comment) (TBD)          Prognosis Prognosis for Safe Diet Advancement: Good      Swallow Study   General Date of Onset: 07/15/20 HPI: 74yo female admitted 07/15/20 with dysphagia x1 month PMH: dermatomyositis, GERD, HTN, HLD, asthma Type of Study: Bedside Swallow  Evaluation Previous Swallow Assessment: esophageal dilation 2019, takes PPI daily Diet Prior to this Study: Regular;Thin liquids Temperature Spikes Noted: No Respiratory Status: Room air History of Recent Intubation: No Behavior/Cognition: Alert;Cooperative;Pleasant mood Oral Cavity Assessment: Within Functional Limits Oral Care Completed by SLP: No (pt eating breakfast) Oral Cavity - Dentition: Dentures, top;Missing dentition (missing several lower teeth) Vision: Functional for self-feeding Self-Feeding Abilities: Able to feed self Patient Positioning: Upright in bed Baseline Vocal Quality: Normal Volitional Cough: Strong Volitional Swallow: Able to elicit    Oral/Motor/Sensory Function Overall Oral Motor/Sensory Function: Within functional limits   Thin Liquid Thin Liquid: Impaired Presentation: Straw Pharyngeal  Phase Impairments: Cough - Immediate    Solid     Solid: Impaired Pharyngeal Phase Impairments: Cough - Immediate Other Comments: globus sensation     Sorcha Rotunno B. Quentin Ore, [facility], [place] Speech Language Pathologist Office: [phone] Pager: [phone]  Shonna Chock 07/16/2020,9:31 AM

## 2020-07-16 NOTE — Care Management Obs Status (Signed)
[facility] NOTIFICATION   Patient Details  Name: Haley Johnson MRN: [id number] Date of Birth: [birth year]   Medicare Observation Status Notification Given:  Yes    Marilu Favre, RN 07/16/2020, 4:34 PM

## 2020-07-16 NOTE — Progress Notes (Signed)
PT Cancellation Note  Patient Details Name: Mykeria Garman MRN: [id number] DOB: 12-04-1945   Cancelled Treatment:    Reason Eval/Treat Not Completed: Patient at procedure or test/unavailable - Pt going for modified barium swallow study, PT to check back as schedule allows.  [place] Pager [phone]  Office [phone]    Roxine Caddy D Elonda Husky 07/16/2020, 9:54 AM

## 2020-07-17 ENCOUNTER — Encounter (HOSPITAL_COMMUNITY): Payer: Self-pay | Admitting: Internal Medicine

## 2020-07-17 ENCOUNTER — Inpatient Hospital Stay (HOSPITAL_COMMUNITY): Payer: Medicare PPO

## 2020-07-17 DIAGNOSIS — R296 Repeated falls: Secondary | ICD-10-CM | POA: Diagnosis present

## 2020-07-17 DIAGNOSIS — S0990XA Unspecified injury of head, initial encounter: Secondary | ICD-10-CM | POA: Diagnosis present

## 2020-07-17 DIAGNOSIS — Y92009 Unspecified place in unspecified non-institutional (private) residence as the place of occurrence of the external cause: Secondary | ICD-10-CM

## 2020-07-17 DIAGNOSIS — M5137 Other intervertebral disc degeneration, lumbosacral region: Secondary | ICD-10-CM | POA: Diagnosis present

## 2020-07-17 DIAGNOSIS — K224 Dyskinesia of esophagus: Secondary | ICD-10-CM | POA: Diagnosis present

## 2020-07-17 DIAGNOSIS — R1310 Dysphagia, unspecified: Secondary | ICD-10-CM

## 2020-07-17 DIAGNOSIS — J690 Pneumonitis due to inhalation of food and vomit: Secondary | ICD-10-CM | POA: Diagnosis not present

## 2020-07-17 DIAGNOSIS — E440 Moderate protein-calorie malnutrition: Secondary | ICD-10-CM | POA: Diagnosis present

## 2020-07-17 DIAGNOSIS — Z6824 Body mass index (BMI) 24.0-24.9, adult: Secondary | ICD-10-CM | POA: Diagnosis not present

## 2020-07-17 DIAGNOSIS — M3313 Other dermatomyositis without myopathy: Secondary | ICD-10-CM | POA: Diagnosis present

## 2020-07-17 DIAGNOSIS — E785 Hyperlipidemia, unspecified: Secondary | ICD-10-CM | POA: Diagnosis present

## 2020-07-17 DIAGNOSIS — E8770 Fluid overload, unspecified: Secondary | ICD-10-CM | POA: Diagnosis present

## 2020-07-17 DIAGNOSIS — D72829 Elevated white blood cell count, unspecified: Secondary | ICD-10-CM | POA: Diagnosis not present

## 2020-07-17 DIAGNOSIS — L22 Diaper dermatitis: Secondary | ICD-10-CM | POA: Diagnosis present

## 2020-07-17 DIAGNOSIS — J45909 Unspecified asthma, uncomplicated: Secondary | ICD-10-CM | POA: Diagnosis present

## 2020-07-17 DIAGNOSIS — H9313 Tinnitus, bilateral: Secondary | ICD-10-CM

## 2020-07-17 DIAGNOSIS — Z803 Family history of malignant neoplasm of breast: Secondary | ICD-10-CM | POA: Diagnosis not present

## 2020-07-17 DIAGNOSIS — H93299 Other abnormal auditory perceptions, unspecified ear: Secondary | ICD-10-CM | POA: Diagnosis present

## 2020-07-17 DIAGNOSIS — R1312 Dysphagia, oropharyngeal phase: Secondary | ICD-10-CM | POA: Diagnosis present

## 2020-07-17 DIAGNOSIS — Z7401 Bed confinement status: Secondary | ICD-10-CM | POA: Diagnosis not present

## 2020-07-17 DIAGNOSIS — R634 Abnormal weight loss: Secondary | ICD-10-CM | POA: Diagnosis present

## 2020-07-17 DIAGNOSIS — E7800 Pure hypercholesterolemia, unspecified: Secondary | ICD-10-CM | POA: Diagnosis present

## 2020-07-17 DIAGNOSIS — Y92481 Parking lot as the place of occurrence of the external cause: Secondary | ICD-10-CM | POA: Diagnosis not present

## 2020-07-17 DIAGNOSIS — E8809 Other disorders of plasma-protein metabolism, not elsewhere classified: Secondary | ICD-10-CM | POA: Diagnosis not present

## 2020-07-17 DIAGNOSIS — W19XXXA Unspecified fall, initial encounter: Secondary | ICD-10-CM

## 2020-07-17 DIAGNOSIS — Z833 Family history of diabetes mellitus: Secondary | ICD-10-CM | POA: Diagnosis not present

## 2020-07-17 DIAGNOSIS — K219 Gastro-esophageal reflux disease without esophagitis: Secondary | ICD-10-CM | POA: Diagnosis present

## 2020-07-17 DIAGNOSIS — R32 Unspecified urinary incontinence: Secondary | ICD-10-CM | POA: Diagnosis present

## 2020-07-17 DIAGNOSIS — I10 Essential (primary) hypertension: Secondary | ICD-10-CM | POA: Diagnosis present

## 2020-07-17 DIAGNOSIS — R531 Weakness: Secondary | ICD-10-CM | POA: Diagnosis present

## 2020-07-17 DIAGNOSIS — Z8042 Family history of malignant neoplasm of prostate: Secondary | ICD-10-CM | POA: Diagnosis not present

## 2020-07-17 DIAGNOSIS — R197 Diarrhea, unspecified: Secondary | ICD-10-CM | POA: Diagnosis present

## 2020-07-17 DIAGNOSIS — M3390 Dermatopolymyositis, unspecified, organ involvement unspecified: Secondary | ICD-10-CM | POA: Diagnosis not present

## 2020-07-17 DIAGNOSIS — R269 Unspecified abnormalities of gait and mobility: Secondary | ICD-10-CM

## 2020-07-17 LAB — COMPREHENSIVE METABOLIC PANEL
ALT: 34 U/L (ref 0–44)
AST: 64 U/L — ABNORMAL HIGH (ref 15–41)
Albumin: 1.8 g/dL — ABNORMAL LOW (ref 3.5–5.0)
Alkaline Phosphatase: 61 U/L (ref 38–126)
Anion gap: 10 (ref 5–15)
BUN: 26 mg/dL — ABNORMAL HIGH (ref 8–23)
CO2: 31 mmol/L (ref 22–32)
Calcium: 8.3 mg/dL — ABNORMAL LOW (ref 8.9–10.3)
Chloride: 95 mmol/L — ABNORMAL LOW (ref 98–111)
Creatinine, Ser: 0.79 mg/dL (ref 0.44–1.00)
GFR calc Af Amer: >60 mL/min (ref >60)
GFR calc non Af Amer: >60 mL/min (ref >60)
Glucose, Bld: 82 mg/dL (ref 70–99)
Potassium: 3.3 mmol/L — ABNORMAL LOW (ref 3.5–5.1)
Sodium: 136 mmol/L (ref 135–145)
Total Bilirubin: 0.3 mg/dL (ref 0.3–1.2)
Total Protein: 5.7 g/dL — ABNORMAL LOW (ref 6.5–8.1)

## 2020-07-17 LAB — PROCALCITONIN: Procalcitonin: 0.23 ng/mL

## 2020-07-17 LAB — GLUCOSE, CAPILLARY
Glucose-Capillary: 94 mg/dL (ref 70–99)
Glucose-Capillary: 88 mg/dL (ref 70–99)
Glucose-Capillary: 149 mg/dL — ABNORMAL HIGH (ref 70–99)
Glucose-Capillary: 106 mg/dL — ABNORMAL HIGH (ref 70–99)

## 2020-07-17 LAB — MAGNESIUM: Magnesium: 1.7 mg/dL (ref 1.7–2.4)

## 2020-07-17 MED ORDER — SODIUM CHLORIDE 0.9 % IV SOLN
500.0000 mg | Freq: Every day | INTRAVENOUS | Status: DC
  Administered 2020-07-17 – 2020-07-19 (×3): 500 mg via INTRAVENOUS
  Filled 2020-07-17 (×3): qty 4

## 2020-07-17 MED ORDER — POTASSIUM CHLORIDE 20 MEQ/15ML (10%) PO SOLN
40.0000 meq | Freq: Four times a day (QID) | ORAL | Status: AC
  Administered 2020-07-17 (×2): 40 meq via ORAL
  Filled 2020-07-17 (×2): qty 30

## 2020-07-17 NOTE — Progress Notes (Signed)
Patient unable to tolerate PO intake even with medication crushed with apple sauce and very small bite due to coughing. Oral suction done and pt tolerated procedure well.  Will monitor and will make on-call MD aware.

## 2020-07-17 NOTE — Progress Notes (Signed)
  Speech Language Pathology Treatment: Dysphagia  Patient Details Name: Haley Johnson MRN: [id number] DOB: [birth year] Today's Date: 07/17/2020 Time: 4627-0350 SLP Time Calculation (min) (ACUTE ONLY): 25 min  Assessment / Plan / Recommendation Clinical Impression  Patient seen with son present for dysphagia management, diet toleration, pharyngeal exercises. Patient able to perform effortful swallow x5 after SLP instruction and demonstration. She was able to approximately perform Mendelsohn maneuver but had difficulty achieving a full swallow while keeping tongue protruding past lips. Patient took sips of water and exhibited intermittent throat clearing. Son and patient wanted SLP to see how she reacts with Ensure shake (slightly thicker than water) and patient did exhibit some more frequent throat clearing and coughing, which is what son says she does with this consistency. Patient also stated that she can't swallow the thicker boluses like thicker liquids, thick soups, etc. SLP to discuss patient and son's concerns with MD. Patient able to return demonstrate pharyngeal exercises again at end of session.     HPI HPI: 74yo female admitted 07/15/20 with dysphagia x1 month PMH: dermatomyositis, GERD, HTN, HLD, asthma      SLP Plan  Continue with current plan of care       Recommendations  Diet recommendations: Thin liquid (full liquids) Liquids provided via: Cup;Straw Medication Administration: Other (Comment) (crushed in liquids or crushed in puree) Compensations: Slow rate;Small sips/bites;Multiple dry swallows after each bite/sip;Clear throat intermittently Postural Changes and/or Swallow Maneuvers: Seated upright 90 degrees;Upright 30-60 min after meal                Oral Care Recommendations: Oral care BID Follow up Recommendations: Skilled Nursing facility;Home health SLP SLP Visit Diagnosis: Dysphagia, oropharyngeal phase (R13.12) Plan: Continue with current plan of  care       GO                Haley Baller, MA, [facility] Pager: [phone]

## 2020-07-17 NOTE — Progress Notes (Addendum)
Spoke with MS3 Joelyn Oms regarding conversation with Haley Johnson's primary rheumatologist Dr.Leverenz who advised starting process for possible transfer for [facility] or other tertiary center with inpatient rheumatology service due to high risk dysphagia with dermatomyositis. Meanwhile advised to start pulse therapy w/ solumedrol for 3 days then supplement with IVIG. Spoke with [facility] transfer coordinator who advises that [facility] is currently over-capacity for critical-level patients and 48 patients are ahead for non-critical level transfer. Also discussed case with Dr.Zhao with [facility] internal medicine who will put Haley Johnson on the transfer list.

## 2020-07-17 NOTE — Progress Notes (Addendum)
   Subjective: Ms. Rieser reports that she is doing "a little better" this morning. She reports improved strength and swallowing, though she continues to complain of "jumpiness" in her arms. Her range of motion in all extremities remains limited due to her pronounced weakness. She has been able to drink some Ensure since being switched to a full liquid diet.  She reports that her last rheumatology visit was in July of last year. She had originally been seen by a rheumatologist in [place] but her care was transferred to [facility] due to increased cutaneous manifestation of her disease.  She also endorses a constant "roaring" sound in her ears for several weeks. She is concerned that this is from "a brain tumor or something." Assured her of negative head CT on admission.   Objective:  Vital signs in last 24 hours: Vitals:   07/16/20 0504 07/16/20 1639 07/16/20 2028 07/17/20 0527  BP: (!) 94/58 112/64 139/74 (!) 107/55  Pulse: 71 76 93 87  Resp: 15  18 16   Temp: (!) 97.3 F (36.3 C) 97.7 F (36.5 C) 99.1 F (37.3 C) 99.8 F (37.7 C)  TempSrc: Oral Oral Oral Oral  SpO2: 98% 98% 98% 97%  Weight:      Height:       Weight change:   Intake/Output Summary (Last 24 hours) at 07/17/2020 0934 Last data filed at 07/17/2020 0540 Gross per 24 hour  Intake 388.81 ml  Output 450 ml  Net -61.19 ml   Physical Exam Constitutional:      General: She is not in acute distress. Cardiovascular:     Rate and Rhythm: Normal rate and regular rhythm.     Pulses: Normal pulses.  Pulmonary:     Breath sounds: Normal breath sounds. No wheezing.     Comments: Bilateral rhonchi noted yesterday have since resolved Musculoskeletal:     Comments: Bilateral LE and UE 1+ pitting edema present; improved from yesterday Range of motion of all extremities severely limited by weakness  Neurological:     Mental Status: She is alert.     Motor: Weakness (all extremeties and neck flexors) present.  Psychiatric:         Mood and Affect: Mood normal.        Thought Content: Thought content normal.     Assessment/Plan:  Worsening weakness/dysphagia likely secondary to underlying dermatomyositis flare: -Strength improved slightly from yesterday, however, pronounced global weakness persists. -Swallowing/PO intake continues to be a challenge though it is marginally improved from yesterday -Continue prednisone 60mg  daily  -Will contact [place] rheumatology for further care recommendations   Fluid overload in the setting of hypoalbuminemia and possible diastolic heart failure exacerbation: -Albumin has downtrended slightly since yesterday, however physical exam findings are improved. -Blood pressure is improved since yesterday, though her diastolic pressures remain low -Suspect that her intravascular volume is slowly improving -Encouraged PO intake of feeding supplements  Hearing disturbance: - Likely tinnitus 2/2 fluid overload. - Expect to improve as her hypervolemia improves.    LOS: 1 day   Pearla Dubonnet, Medical Student 07/17/2020, 9:34 AM

## 2020-07-17 NOTE — Consult Note (Signed)
Physical Medicine and Rehabilitation Consult   Reason for Consult: Debility.  Referring Physician: Dr. Donalee Citrin   HPI: Haley Johnson is a 74 y.o. female with history of HTN, GERD, esophageal stricture, DDD with RLE radiculopathy--last ESI 3/21,  Dermatomyositis, recent admission for pelvic abscesses with increasing BLE edema, difficulty ambulating a as well as issues with recent swallowing problems.  History taken from chart review, son, and patient.  Patient has had swallowing difficulty since the first of the year but getting progressively worse. She has not been ambulatory since admit to hospital in May for back pain/abscess. Since then she has been declining with multiple falls and has been bed bound.  She required assistance for all ADLs.  She was diagnosed with dermatomyositis in 2019--meds changed a year ago to Cellcept. Son feels that she started getting weak after the second Covid injection. Family moved her to [place] to help care for her and she has not taken her Cellcept since then (left them home).  She has 2 sons with overlapping work shifts-patient will be alone for 3-4 hours/day.Marland Kitchen   She was admitted on 07/15/2020 after a fall, trying to get into a car for MD appointment and was unable to get up.  CT head unremarkable for acute intracranial process.  MBS done due to reports of globus sensation and showed significant pharyngeal dysphagia with significant residue and full liquid diet recommended. Dr. Paulita Fujita consulted for input and recommended esophagogram which showed moderate barium retention as well as frank aspiration with cough reflex.  She was started on prednisone due to concerns of dermatomyositis flare.  She has had issue with hypoxia today--sats in 70's requiring supplemental oxygen. Therapy evaluations revealed BLE weakness with fear of falling. CIR recommended due to debility.  Review of Systems  Constitutional: Positive for malaise/fatigue. Negative for fever.  HENT:  Negative for hearing loss.   Eyes: Negative for blurred vision and double vision.  Respiratory: Positive for cough.   Cardiovascular: Positive for leg swelling. Negative for chest pain.  Gastrointestinal: Positive for heartburn. Negative for abdominal pain.  Genitourinary: Negative for urgency.  Musculoskeletal: Positive for back pain, falls, myalgias and neck pain.  Skin: Negative for rash.  Neurological: Positive for sensory change (fingers used to burn) and weakness.  All other systems reviewed and are negative.    Past Medical History:  Diagnosis Date  . Asthma   . Dermatomyositis ([place]) 05/05/2020  . Essential hypertension 05/05/2020  . GERD (gastroesophageal reflux disease)   . GERD without esophagitis 05/05/2020  . Hypercholesteremia   . Hypertension     History reviewed. No pertinent surgical history.    Family History  Problem Relation Age of Onset  . Breast cancer Mother   . Prostate cancer Brother     Social History:  Widowed. Worked in cafeteria--retired in 2010.  She was independent with cane but unsteady till May. She reports that she has never smoked. She has never used smokeless tobacco. No history on file for alcohol use and drug use.    Allergies  Allergen Reactions  . Chocolate Swelling  . Blueberry Flavor Swelling    Face swells   . Tomato Swelling    Face swelling    Medications Prior to Admission  Medication Sig Dispense Refill  . albuterol (VENTOLIN HFA) 108 (90 Base) MCG/ACT inhaler Inhale 1-2 puffs into the lungs every 6 (six) hours as needed for wheezing or shortness of breath.    Marland Kitchen amLODipine (NORVASC) 5 MG tablet  Take 5 mg by mouth daily.    . bisoprolol-hydrochlorothiazide (ZIAC) 2.5-6.25 MG tablet Take 1 tablet by mouth daily.    Ruthe Mannan 200-5 MCG/ACT AERO Inhale 2 puffs into the lungs daily as needed for shortness of breath or wheezing.    Marland Kitchen etodolac (LODINE) 400 MG tablet Take 400 mg by mouth 2 (two) times daily.    . famotidine  (PEPCID) 40 MG tablet Take 40 mg by mouth daily.    . feeding supplement, GLUCERNA SHAKE, (GLUCERNA SHAKE) LIQD Take 237 mLs by mouth 3 (three) times daily between meals. (Patient taking differently: Take 237 mLs by mouth every other day. ) 21330 mL 0  . mycophenolate (CELLCEPT) 500 MG tablet Take 1,000 mg by mouth 2 (two) times daily.    Marland Kitchen omeprazole (PRILOSEC) 40 MG capsule TAKE 1 CAPSULE(40 MG) BY MOUTH DAILY (Patient taking differently: Take 40 mg by mouth daily. ) 90 capsule 0  . predniSONE (DELTASONE) 10 MG tablet Take 1 tablet (10 mg total) by mouth daily. 30 tablet 1  . pregabalin (LYRICA) 100 MG capsule Take 1 capsule (100 mg total) by mouth 3 (three) times daily. 90 capsule 1  . rosuvastatin (CRESTOR) 40 MG tablet Take 40 mg by mouth daily.    . Zinc Oxide (DESITIN MAXIMUM STRENGTH EX) Apply 1 application topically daily as needed (rash prevention).    Marland Kitchen oxyCODONE-acetaminophen (PERCOCET/ROXICET) 5-325 MG tablet Take 1 tablet by mouth every 6 (six) hours as needed for moderate pain or severe pain. (Patient not taking: Reported on 07/16/2020) 15 tablet 0    Home: [place] expects to be discharged to:: Private residence Living Arrangements: Other relatives, Children Available Help at Discharge: Family, Available PRN/intermittently, Available 24 hours/day Type of Home: House Home Access: Level entry Entrance Stairs-Rails:  (holds the porch) Home Layout: One level Bathroom Shower/Tub: Chiropodist: Standard Bathroom Accessibility: No Home Equipment: Environmental consultant - 2 wheels, Cane - single point Additional Comments: pt has aides T/Th 1 hour, PT and OT-- pt will refer to her home unless you ask her where she is living currenlty which is Son's home that is listed in the chart  Functional History: Prior Function Level of Independence: Needs assistance Gait / Transfers Assistance Needed: uses RW ADL's / Homemaking Assistance Needed: Aide helps pt wash up,  son helps her dress Functional Status:  Mobility: Bed Mobility Overal bed mobility: Needs Assistance Bed Mobility: Rolling, Supine to Sit, Sit to Supine Rolling: Min assist Supine to sit: +2 for physical assistance, Mod assist Sit to supine: +2 for physical assistance, Mod assist General bed mobility comments: pt initiates log rolling with hip and head but unable to lift shoulders to initiate arms. once hand placed over belly on rail attempting to pull. pt requires (A) To elevate trunk from bed surface with minimal UE (A) due to weakness. pt static sitting with ming (A) <>mod (A).  Transfers Overall transfer level: Needs assistance Equipment used: Rolling walker (2 wheeled) Transfers: Sit to/from Stand Sit to Stand: +2 physical assistance, Mod assist General transfer comment: pt with bil LE blocked initially and progressed to guarding with knees extended chest anterior to attempt to compensate for basic deficits. pt when asked to step toward [facility] pt uses heel to toe wiggle to move feet. pt does not flex knees adn reports fear her legs will not hold her if she does not keep knees extended      ADL: ADL Overall ADL's : Needs assistance/impaired Eating/Feeding: Bed level,  Maximal assistance Eating/Feeding Details (indicate cue type and reason): decrease grasp- could benefit from further assessment of AE for hands / handled cup . currentl unable to complete shoulder flexion for any self feeding Grooming: Maximal assistance Upper Body Bathing: Maximal assistance Lower Body Bathing: Total assistance Lower Body Dressing: Total assistance Lower Body Dressing Details (indicate cue type and reason): don socks General ADL Comments: eob sitting and attempting to set bed to chair position but unablet o do so due to bed issue  Cognition: Cognition Overall Cognitive Status: Within Functional Limits for tasks assessed Orientation Level: Oriented X4 Cognition Arousal/Alertness:  Awake/alert Behavior During Therapy: WFL for tasks assessed/performed Overall Cognitive Status: Within Functional Limits for tasks assessed  Blood pressure 126/67, pulse (!) 103, temperature 98.6 F (37 C), temperature source Oral, resp. rate 20, height 5\' 2"  (1.575 m), weight 61.9 kg, SpO2 (!) 75 %. Physical Exam Vitals and nursing note reviewed.  Constitutional:      General: She is not in acute distress.    Appearance: Normal appearance.  HENT:     Head: Normocephalic and atraumatic.     Right Ear: External ear normal.     Left Ear: External ear normal.     Nose: Nose normal.  Eyes:     General:        Right eye: No discharge.        Left eye: No discharge.     Extraocular Movements: Extraocular movements intact.  Neck:     Comments: Decreased ROM Cardiovascular:     Rate and Rhythm: Regular rhythm.     Comments: Tachycardia Pulmonary:     Effort: Pulmonary effort is normal.     Breath sounds: Normal breath sounds.  Abdominal:     General: Abdomen is flat. Bowel sounds are normal. There is no distension.  Musculoskeletal:     Cervical back: Normal range of motion and neck supple.     Comments: Bilateral lower extremity edema  Skin:    General: Skin is warm and dry.  Neurological:     Mental Status: She is alert and oriented to person, place, and time.     Comments: Alert Bilateral upper extremities: Shoulder abduction to -/5, elbow flexion/extension 4 -/5, handgrip 4/5 Bilateral lower extremities: Hip flexion 2+/5, knee extension 4/5, ankle dorsiflexion 4+/5 Bilateral upper extremity tremors  Psychiatric:        Mood and Affect: Mood normal.        Behavior: Behavior normal.    Results for orders placed or performed during the hospital encounter of 07/15/20 (from the past 24 hour(s))  Glucose, capillary     Status: Abnormal   Collection Time: 07/16/20  5:09 PM  Result Value Ref Range   Glucose-Capillary 158 (H) 70 - 99 mg/dL  Glucose, capillary     Status:  Abnormal   Collection Time: 07/16/20  8:31 PM  Result Value Ref Range   Glucose-Capillary 116 (H) 70 - 99 mg/dL  Comprehensive metabolic panel Once     Status: Abnormal   Collection Time: 07/17/20  4:05 AM  Result Value Ref Range   Sodium 136 135 - 145 mmol/L   Potassium 3.3 (L) 3.5 - 5.1 mmol/L   Chloride 95 (L) 98 - 111 mmol/L   CO2 31 22 - 32 mmol/L   Glucose, Bld 82 70 - 99 mg/dL   BUN 26 (H) 8 - 23 mg/dL   Creatinine, Ser 0.79 0.44 - 1.00 mg/dL   Calcium 8.3 (L) 8.9 -  10.3 mg/dL   Total Protein 5.7 (L) 6.5 - 8.1 g/dL   Albumin 1.8 (L) 3.5 - 5.0 g/dL   AST 64 (H) 15 - 41 U/L   ALT 34 0 - 44 U/L   Alkaline Phosphatase 61 38 - 126 U/L   Total Bilirubin 0.3 0.3 - 1.2 mg/dL   GFR calc non Af Amer >60 >60 mL/min   GFR calc Af Amer >60 >60 mL/min   Anion gap 10 5 - 15  Magnesium     Status: None   Collection Time: 07/17/20  4:05 AM  Result Value Ref Range   Magnesium 1.7 1.7 - 2.4 mg/dL  Glucose, capillary     Status: None   Collection Time: 07/17/20  7:54 AM  Result Value Ref Range   Glucose-Capillary 94 70 - 99 mg/dL  Glucose, capillary     Status: None   Collection Time: 07/17/20 11:33 AM  Result Value Ref Range   Glucose-Capillary 88 70 - 99 mg/dL   CT HEAD WO CONTRAST  Result Date: 07/15/2020 CLINICAL DATA:  Fall following syncopal episode, initial encounter EXAM: CT HEAD WITHOUT CONTRAST TECHNIQUE: Contiguous axial images were obtained from the base of the skull through the vertex without intravenous contrast. COMPARISON:  None. FINDINGS: Brain: No evidence of acute infarction, hemorrhage, hydrocephalus, extra-axial collection or mass lesion/mass effect. Vascular: No hyperdense vessel or unexpected calcification. Skull: Normal. Negative for fracture or focal lesion. Sinuses/Orbits: No acute finding. Other: None. IMPRESSION: No acute intracranial abnormality noted. Electronically Signed   By: Inez Catalina M.D.   On: 07/15/2020 18:30   DG Swallowing Func-Speech  Pathology  Result Date: 07/16/2020 Objective Swallowing Evaluation: Type of Study: MBS-Modified Barium Swallow Study  Patient Details Name: Madilynn Montante MRN: [id number] Date of Birth: 09/16/1946 Today's Date: 07/16/2020 Time: SLP Start Time (ACUTE ONLY): 35 -SLP Stop Time (ACUTE ONLY): 1035 SLP Time Calculation (min) (ACUTE ONLY): 30 min Past Medical History: Past Medical History: Diagnosis Date . Asthma  . Dermatomyositis ([place]) 05/05/2020 . Essential hypertension 05/05/2020 . GERD (gastroesophageal reflux disease)  . GERD without esophagitis 05/05/2020 . Hypercholesteremia  . Hypertension  Past Surgical History: No past surgical history on file. HPI: 74yo female admitted 07/15/20 with dysphagia x1 month PMH: dermatomyositis, GERD, HTN, HLD, asthma  Subjective: Pt seen in radiology for MBS Assessment / Plan / Recommendation CHL IP CLINICAL IMPRESSIONS 07/16/2020 Clinical Impression Pt seen in radiology for inpatient MBS. She was scheduled for outpatient MBS 07/25/20, however, given presentation at bedside the decision was made to complete the study today. Pt presents with significant pharyngeal dysphagia, characterized by trigger of swallow reflex at the level of the pyriform sinuses across consistencies. Pt exhibits reduced tongue base retraction, poor laryngeal elevation and anterior hyoid excursion, minimal epiglottic movement, and poor pharyngeal stripping. Pt also appears to have poor relaxation of the cricopharyngeal muscle. These deficits raise concern for issues with cranial nerves V, IX, X, and XII, in addition to the spinal nerve at C1. Functionally, pt exhibits poor clearing and significant vallecular and pyriform sinus residue, which was noted to penetrate the laryngeal vestibule after the swallow. Pt is sensate to this penetrate, and performs multiple dry swallows and frequent throat clearing to attempt removal of penetrate and residue. She is at significant risk for aspiration and inadequate  nutrition with current swallow function.  Esophageal sweep revealed it to be clear. Full liquid diet is recommended at this time, with meds in liquid form if possible. Thicker consistencies were noted to  cause more residue than thinner consistencies, and were more resistant to clear. ST will continue to follow pt to provide strengthening exercises. The following exercises may be beneficial: Effortful swallow, Mendelsohn Maneuver, Tongue pull back, CTAR, Shaker. Pt may also benefit from EMST (expiratory muscle strength trainer). Recommend continued ST intervention at next level of care to maximize safe and functional swallow.  SLP Visit Diagnosis Dysphagia, oropharyngeal phase (R13.12)     Impact on safety and function Severe aspiration risk;Risk for inadequate nutrition/hydration   CHL IP TREATMENT RECOMMENDATION 07/16/2020 Treatment Recommendations Therapy as outlined in treatment plan below   Prognosis 07/16/2020 Prognosis for Safe Diet Advancement Fair Barriers to Reach Goals Severity of deficits   CHL IP DIET RECOMMENDATION 07/16/2020 SLP Diet Recommendations Full liquid Liquid Administration via Cup;Straw Medication Administration Liquid if possible Compensations Slow rate;Small sips/bites;Multiple dry swallows after each bite/sip;Clear throat intermittently Postural Changes Remain semi-upright after after feeds/meals Seated upright at 90 degrees   CHL IP OTHER RECOMMENDATIONS 07/16/2020   Oral Care Recommendations Oral care QID     CHL IP FOLLOW UP RECOMMENDATIONS 07/16/2020 Follow up Recommendations Recommend continued skilled ST intervention at next venue of care   CHL IP FREQUENCY AND DURATION 07/16/2020 Speech Therapy Frequency (ACUTE ONLY) min 2x/week Treatment Duration 1 week;2 weeks      CHL IP ORAL PHASE 07/16/2020 Oral Phase WFL    CHL IP PHARYNGEAL PHASE 07/16/2020 Pharyngeal Phase Impaired   Pharyngeal- Nectar Teaspoon Thin Liquid via cup/straw Puree Delayed swallow initiation-pyriform sinuses Reduced  pharyngeal peristalsis Reduced epiglottic inversion Reduced anterior laryngeal mobility Reduced laryngeal elevation Reduced airway/laryngeal closure Reduced tongue base retraction Penetration/Apiration after swallow Pharyngeal residue - valleculae Pharyngeal residue - pyriform  Pharyngeal Material enters airway, remains ABOVE vocal cords and not ejected out - penetrate does not clear spontaneously. Pt exhibits multiple dry swallows and frequent throat clearing in attempt to clear residue and penetrate.    CHL IP CERVICAL ESOPHAGEAL PHASE 07/16/2020 Cervical Esophageal Phase Impaired   Nectar Teaspoon Thin cup/straw Puree  Reduced cricopharyngeal relaxation Cervical Esophageal Comment Pyriform residue significantly increases risk of aspiration after the swallow  Shonna Chock 07/16/2020, 11:21 AM              DG ESOPHAGUS W SINGLE CM (SOL OR THIN BA)  Result Date: 07/16/2020 CLINICAL DATA:  Inpatient. Dermatomyositis. Gastroesophageal reflux disease. Abnormal modified barium swallow performed earlier today with barium retention in the pharynx. EXAM: ESOPHOGRAM/BARIUM SWALLOW TECHNIQUE: Single contrast examination was performed using  thin barium. FLUOROSCOPY TIME:  Fluoroscopy Time:  1 minutes 42 seconds Number of Acquired Spot Images: 2 COMPARISON:  Modified barium swallow from earlier today. 05/05/2020 CT chest, abdomen and pelvis. FINDINGS: Examination limited by patient mobility restrictions. Examination performed in the LPO position with head elevation. An episode of frank tracheobronchial aspiration occurred after approximately 3-4 barium swallows, which prompted a cough reflex. Patient remained in no apparent distress during the examination. Patient was encouraged to clear the airway with coughs. The examination was discontinued at this point. Moderate barium retention observed in the vallecula and piriform sinuses. Normal esophageal distensibility, with no gross evidence of esophageal mass, ulcer or  stricture. Prominent esophageal dysmotility observed, characterized by weakening of primary peristalsis throughout the esophagus with weak tertiary contractions. No hiatal hernia. Unable to assess for gastroesophageal reflux. IMPRESSION: 1. Limited examination, see comments. 2. An episode of frank tracheobronchial aspiration occurred after approximately 3-4 barium swallows, which prompted a cough reflex. Examination discontinued at this point. Patient remained in no  apparent distress during the examination and clearing of the airway with coughs was encouraged. 3. Moderate barium retention in the vallecula and piriform sinuses. Please see the modified barium swallow report from earlier today for further details. 4. Prominent esophageal dysmotility as detailed. 5. No gross evidence of esophageal mass or stricture. No hiatal hernia. Electronically Signed   By: Ilona Sorrel M.D.   On: 07/16/2020 17:02    Assessment/Plan: Diagnosis: Debility Labs independently reviewed.  Records reviewed and summated above.  Does the need for close, 24 hr/day medical supervision in concert with the patient's rehab needs make it unreasonable for this patient to be served in a less intensive setting? Yes Co-Morbidities requiring supervision/potential complications: HTN (monitor and provide prns in accordance with increased physical exertion and pain), GERD, esophageal stricture, DDD with RLE radiculopathy--last ESI 3/21, dermatomyositis, recent admission for pelvic abscesses with increasing BLE edema, difficulty ambulating a as well as issues with recent swallowing problems.  1. Due to safety, disease management and patient education, does the patient require 24 hr/day rehab nursing? Yes 2. Does the patient require coordinated care of a physician, rehab nurse, therapy disciplines of PT/OT/SLP to address physical and functional deficits in the context of the above medical diagnosis(es)? Yes Addressing deficits in the following  areas: balance, endurance, locomotion, strength, transferring, bowel/bladder control, bathing, dressing, toileting, swallowing and psychosocial support 3. Can the patient actively participate in an intensive therapy program of at least 3 hrs of therapy per day at least 5 days per week? Potentially 4. The potential for patient to make measurable gains while on inpatient rehab is good 5. Anticipated functional outcomes upon discharge from inpatient rehab are min assist  with PT, min assist with OT, supervision and min assist with SLP. 6. Estimated rehab length of stay to reach the above functional goals is: 17-20 day. 7. Anticipated discharge destination: Home 8. Overall Rehab/Functional Prognosis: good and fair  RECOMMENDATIONS: This patient's condition is appropriate for continued rehabilitative care in the following setting: CIR caregiver support available upon discharge. Patient has agreed to participate in recommended program. Yes Note that insurance prior authorization may be required for reimbursement for recommended care.  Comment: Rehab Admissions Coordinator to follow up.  I have personally performed a face to face diagnostic evaluation, including, but not limited to relevant history and physical exam findings, of this patient and developed relevant assessment and plan.  Additionally, I have reviewed and concur with the physician assistant's documentation above.   Delice Lesch, MD, ABPMR Bary Leriche, PA-C 07/17/2020

## 2020-07-17 NOTE — Progress Notes (Signed)
Inpatient Rehabilitation Admissions Coordinator  Inpatient rehab consult received. I me with patient and her son at bedside.e We discussed goals and expectations of a possible Cir admit They are in agreement. I will begin insurance authorization with [facility] for a possible Cir admit.  Danne Baxter, RN, MSN Rehab Admissions Coordinator [phone] 07/17/2020 1:35 PM

## 2020-07-17 NOTE — Plan of Care (Signed)
Esophagram yesterday reviewed.  No esophageal stricture or mass.  Florid oropharyngeal dysfunction noted.  No role for endoscopy.  Focus should be treatment of dermatomyositis and management of patient's significant oropharyngeal dysphagia.  Please call us back with any future questions/concerns.

## 2020-07-17 NOTE — Progress Notes (Signed)
Nutrition Follow-up  DOCUMENTATION CODES:   Non-severe (moderate) malnutrition in context of chronic illness  INTERVENTION:   -Continue Ensure Enlive po TID, each supplement provides 350 kcal and 20 grams of protein -Continue Hormel Shake TID with meals, each supplement provides 520 kcals and 22 grams protein -Continue Magic cup TID with meals, each supplement provides 290 kcal and 9 grams of protein -Continue 15 ml liquid MVI daily -Feeding assistance with meals -If prolonged full liquid diet is anticipated, pt may require supplemental nutrition support (ex enteral nutrition) to help meet nutritional goals  NUTRITION DIAGNOSIS:   Moderate Malnutrition related to chronic illness (dermatomyositis) as evidenced by energy intake < or equal to 75% for > or equal to 1 month, mild fat depletion, moderate fat depletion, mild muscle depletion, moderate muscle depletion, percent weight loss.  Ongoing  GOAL:   Patient will meet greater than or equal to 90% of their needs  Progressing   MONITOR:   PO intake, Supplement acceptance, Diet advancement, Labs, Weight trends, Skin, I & O's  REASON FOR ASSESSMENT:   Malnutrition Screening Tool    ASSESSMENT:   Mrs.Sanagustin is a 74 yo F with PMHx of dermatomyositis, GERD, HTN, HLD, and Asthma presenting to [facility] w/ dysphagia. Due to weight loss, weakness, and a fall in the parking lot she was admitted. She states having difficulty with swallowing and eating for about 1 month. She states she coughs up her food because she cannot swallow and also have difficulty with chewing her food. She states that she also has sores in her mouth that cause her to have pain with chewing  8/24- s/p MBSS- advanced to full liquid diet  Reviewed I/O's: -221 ml x 24 hours and +477 ml since admission  UOP: 850 ml x 24 hours  Spoke with pt and son at bedside, who both report a general decline in health over the past 2 months, related to poor appetite  related to dysphagia and generalized weakness. Pt son describes pt as an independent, active woman who lived on her own prior to illness. Pt shares that she was progressively needing more assistance over the past month, to the point where she needed help getting in and out of bed ("my legs just fell out").   Pt reports that swallowing is a little improved, but still very difficult. She has sores in her mouth, which make eating painful. Additionally, she feels like most things still get stuck in her throat. She consumed about 1/2 of an Ensure yesterday. She did not eat much breakfast, as she needs assistance feeding herself. She reports her son will help her with lunch.  Reviewed wt hx; pt has experienced a 12.1% wt loss over the past month, which is significant for time frame. Pt son endorses a 20-30# wt loss over the past month.   Discussed what a full liquid diet entails as well as supplements ordered for her (Ensure, Hormel Shake, and [employer]). Pt willing to accept all recommendations.   Per MD notes, hopeful that steroids will help improve her condition. If swallow and PO intake fail to improve, amy require artificial means of nutrition is this is within pt's goals of care.   Labs reviewed: K: 3.3, CBGS: 94-116.   NUTRITION - FOCUSED PHYSICAL EXAM:    Most Recent Value  Orbital Region Moderate depletion  Upper Arm Region Mild depletion  Thoracic and Lumbar Region No depletion  Buccal Region Mild depletion  Temple Region Moderate depletion  Clavicle Bone  Region Mild depletion  Clavicle and Acromion Bone Region Mild depletion  Scapular Bone Region Mild depletion  Dorsal Hand Mild depletion  Patellar Region Mild depletion  Anterior Thigh Region Mild depletion  Posterior Calf Region Mild depletion  Edema (RD Assessment) Mild  Hair Reviewed  Eyes Reviewed  Mouth Reviewed  Skin Reviewed  Nails Reviewed       Diet Order:   Diet Order            Diet full liquid Room service  appropriate? Yes; Fluid consistency: Thin  Diet effective now                 EDUCATION NEEDS:   Education needs have been addressed  Skin:  Skin Assessment: Skin Integrity Issues: Skin Integrity Issues:: Other (Comment) Other: MASD labia  Last BM:  07/16/20  Height:   Ht Readings from Last 1 Encounters:  07/15/20 5\' 2"  (1.575 m)    Weight:   Wt Readings from Last 1 Encounters:  07/15/20 61.9 kg    Ideal Body Weight:  50 kg  BMI:  Body mass index is 24.95 kg/m.  Estimated Nutritional Needs:   Kcal:  1850-2050  Protein:  95-110 grams  Fluid:  > 1.8 L    Loistine Chance, RD, LDN, [place] Registered Dietitian II Certified Diabetes Care and Education Specialist Please refer to [facility] for RD and/or RD on-call/weekend/after hours pager

## 2020-07-18 DIAGNOSIS — J690 Pneumonitis due to inhalation of food and vomit: Secondary | ICD-10-CM

## 2020-07-18 DIAGNOSIS — R000 Tachycardia, unspecified: Secondary | ICD-10-CM

## 2020-07-18 LAB — COMPREHENSIVE METABOLIC PANEL
ALT: 33 U/L (ref 0–44)
AST: 64 U/L — ABNORMAL HIGH (ref 15–41)
Albumin: 1.8 g/dL — ABNORMAL LOW (ref 3.5–5.0)
Alkaline Phosphatase: 64 U/L (ref 38–126)
Anion gap: 11 (ref 5–15)
BUN: 23 mg/dL (ref 8–23)
CO2: 29 mmol/L (ref 22–32)
Calcium: 8.2 mg/dL — ABNORMAL LOW (ref 8.9–10.3)
Chloride: 96 mmol/L — ABNORMAL LOW (ref 98–111)
Creatinine, Ser: 0.76 mg/dL (ref 0.44–1.00)
GFR calc Af Amer: >60 mL/min (ref >60)
GFR calc non Af Amer: >60 mL/min (ref >60)
Glucose, Bld: 182 mg/dL — ABNORMAL HIGH (ref 70–99)
Potassium: 4.2 mmol/L (ref 3.5–5.1)
Sodium: 136 mmol/L (ref 135–145)
Total Bilirubin: 0.5 mg/dL (ref 0.3–1.2)
Total Protein: 6 g/dL — ABNORMAL LOW (ref 6.5–8.1)

## 2020-07-18 LAB — VITAMIN B12: Vitamin B-12: 1130 pg/mL — ABNORMAL HIGH (ref 180–914)

## 2020-07-18 LAB — CBC
HCT: 29.8 % — ABNORMAL LOW (ref 36.0–46.0)
Hemoglobin: 9.5 g/dL — ABNORMAL LOW (ref 12.0–15.0)
MCH: 31.4 pg (ref 26.0–34.0)
MCHC: 31.9 g/dL (ref 30.0–36.0)
MCV: 98.3 fL (ref 80.0–100.0)
Platelets: 141 10*3/uL — ABNORMAL LOW (ref 150–400)
RBC: 3.03 MIL/uL — ABNORMAL LOW (ref 3.87–5.11)
RDW: 16.5 % — ABNORMAL HIGH (ref 11.5–15.5)
WBC: 12.5 10*3/uL — ABNORMAL HIGH (ref 4.0–10.5)
nRBC: 0 % (ref 0.0–0.2)

## 2020-07-18 LAB — VITAMIN D 25 HYDROXY (VIT D DEFICIENCY, FRACTURES): Vit D, 25-Hydroxy: 20.5 ng/mL — ABNORMAL LOW (ref 30–100)

## 2020-07-18 LAB — PROCALCITONIN: Procalcitonin: 0.49 ng/mL

## 2020-07-18 LAB — GLUCOSE, CAPILLARY
Glucose-Capillary: 176 mg/dL — ABNORMAL HIGH (ref 70–99)
Glucose-Capillary: 180 mg/dL — ABNORMAL HIGH (ref 70–99)
Glucose-Capillary: 153 mg/dL — ABNORMAL HIGH (ref 70–99)

## 2020-07-18 LAB — MAGNESIUM: Magnesium: 1.6 mg/dL — ABNORMAL LOW (ref 1.7–2.4)

## 2020-07-18 MED ORDER — MAGNESIUM SULFATE 2 GM/50ML IV SOLN
2.0000 g | Freq: Once | INTRAVENOUS | Status: AC
  Administered 2020-07-18: 2 g via INTRAVENOUS
  Filled 2020-07-18: qty 50

## 2020-07-18 MED ORDER — OSMOLITE 1.2 CAL PO LIQD
1000.0000 mL | ORAL | Status: DC
  Filled 2020-07-18: qty 1000

## 2020-07-18 MED ORDER — FREE WATER
150.0000 mL | Status: DC
  Administered 2020-07-20 (×7): 150 mL

## 2020-07-18 MED ORDER — OSMOLITE 1.5 CAL PO LIQD
1000.0000 mL | ORAL | Status: DC
  Administered 2020-07-19 – 2020-07-20 (×2): 1000 mL
  Filled 2020-07-18 (×3): qty 1000

## 2020-07-18 MED ORDER — IMMUNE GLOBULIN (HUMAN) 10 GM/100ML IV SOLN
400.0000 mg/kg | INTRAVENOUS | Status: DC
  Administered 2020-07-20: 25 g via INTRAVENOUS
  Filled 2020-07-18: qty 300
  Filled 2020-07-18: qty 200
  Filled 2020-07-18: qty 300

## 2020-07-18 MED ORDER — PROSOURCE TF PO LIQD
45.0000 mL | Freq: Two times a day (BID) | ORAL | Status: DC
  Administered 2020-07-19 – 2020-07-20 (×4): 45 mL
  Filled 2020-07-18 (×5): qty 45

## 2020-07-18 NOTE — Progress Notes (Signed)
Nutrition Follow-up  DOCUMENTATION CODES:   Non-severe (moderate) malnutrition in context of chronic illness  INTERVENTION:   -Monitor Vitamin C, Vitamin D, B vitamins, thiamine, and copper levels and replete as needed; MD has ordered labs -D/c Ensure Enlive po BID, each supplement provides 350 kcal and 20 grams of protein -RD will follow for diet advancement and adjust supplement regimen as appropriate -Continue 15 ml MVI daily -Once cortrak is placed:  Initiate Osmolite 1.5 @ 20 ml/hr via cortrak tube and increase by 10 ml every 8 hours to goal rate of 50 ml/hr.   45 ml Prosource TF BID  150 ml free water flush every 4 hours   Tube feeding regimen provides 1880 kcal (100% of needs), 97 grams of protein, and 914 ml of H2O. Total free water: 1814 ml/day  -Monitor K, Mg, and Phos daily and replete as needed due to high risk of refeeding syndrome  NUTRITION DIAGNOSIS:   Moderate Malnutrition related to chronic illness (dermatomyositis) as evidenced by energy intake < or equal to 75% for > or equal to 1 month, mild fat depletion, moderate fat depletion, mild muscle depletion, moderate muscle depletion, percent weight loss.  Ongoing  GOAL:   Patient will meet greater than or equal to 90% of their needs  Unmet  MONITOR:   PO intake, Supplement acceptance, Diet advancement, Labs, Weight trends, Skin, I & O's  REASON FOR ASSESSMENT:   Malnutrition Screening Tool    ASSESSMENT:   Haley Johnson is a 74 yo F with PMHx of dermatomyositis, GERD, HTN, HLD, and Asthma presenting to [facility] w/ dysphagia. Due to weight loss, weakness, and a fall in the parking lot she was admitted. She states having difficulty with swallowing and eating for about 1 month. She states she coughs up her food because she cannot swallow and also have difficulty with chewing her food. She states that she also has sores in her mouth that cause her to have pain with chewing  8/24- s/p MBSS- advanced to  full liquid diet 8/25- s/p BSE- downgraded to clear liquid diet  Reviewed I/O's: -96 ml x 24 hours and +381 ml since admission  UOP: 550 ml x 24 hours   Case discussed with RN, who reports pt continues to have increased difficulty with clear liquids and thicker consistencies, such Ensure supplements.   Given pt's weakness and malnutrition, suspect pt may be at high risk for micronutrient deficiencies (vitamin C, vitamin D, B vitamins, thiamine, and copper). Also concerned about pt's continued poor oral intake and nutritional status. Case discussed MD, who is amenable to cortrak placement and ordering vitamin labs. Cortrak to be placed tomorrow, 07/19/20, when service is available. RN aware.   Labs reviewed: Mg: 1.6, CBGS: 106-180 (inpatient orders for glycemic control are 0-15 units insulin aspart TID with meals).   Diet Order:   Diet Order            Diet clear liquid Room service appropriate? Yes; Fluid consistency: Thin  Diet effective now                 EDUCATION NEEDS:   Education needs have been addressed  Skin:  Skin Assessment: Skin Integrity Issues: Skin Integrity Issues:: Other (Comment) Other: MASD labia  Last BM:  07/18/20  Height:   Ht Readings from Last 1 Encounters:  07/15/20 5\' 2"  (1.575 m)    Weight:   Wt Readings from Last 1 Encounters:  07/18/20 63.5 kg    Ideal Body Weight:  50 kg  BMI:  Body mass index is 25.6 kg/m.  Estimated Nutritional Needs:   Kcal:  1850-2050  Protein:  95-110 grams  Fluid:  > 1.8 L    Loistine Chance, RD, LDN, [place] Registered Dietitian II Certified Diabetes Care and Education Specialist Please refer to [facility] for RD and/or RD on-call/weekend/after hours pager

## 2020-07-18 NOTE — Progress Notes (Signed)
Physical Therapy Treatment Patient Details Name: Haley Johnson MRN: [id number] DOB: August 17, 1946 Today's Date: 07/18/2020    History of Present Illness 74 yo F with presenting to [facility] w/ dysphagia. Due to weight loss, weakness, and a fall in the parking lot she was admitted to [facility] on 8/23. PMH: asthma, dermatomyositis, GERD, HTN, HLD, hypercholesteremia    PT Comments    Pt was seen for mobility today with better functional mobility than last PT session.  Her plan is to progress gait and strengthening ex as tolerated, and still requesting CIR to follow up as needed.  Has some plan to transfer hosp and will hopefully be in rehab setting ASAP.     Follow Up Recommendations  CIR     Equipment Recommendations  None recommended by PT    Recommendations for Other Services       Precautions / Restrictions Precautions Precautions: Fall Precaution Comments: wounds in peri area Restrictions Weight Bearing Restrictions: No    Mobility  Bed Mobility Overal bed mobility: Needs Assistance Bed Mobility: Rolling;Supine to Sit;Sit to Supine Rolling: Min assist   Supine to sit: Min assist Sit to supine: Mod assist      Transfers Overall transfer level: Needs assistance Equipment used: Rolling walker (2 wheeled) Transfers: Sit to/from Stand Sit to Stand: Mod assist            Ambulation/Gait Ambulation/Gait assistance: Min assist Gait Distance (Feet): 10 Feet (5x2 side of bed) Assistive device: Rolling walker (2 wheeled);1 person hand held assist Gait Pattern/deviations: Step-to pattern;Wide base of support;Decreased stride length Gait velocity: reduced Gait velocity interpretation: <1.31 ft/sec, indicative of household ambulator General Gait Details: sidesteps with RW   Stairs             Wheelchair Mobility    Modified Rankin (Stroke Patients Only)       Balance Overall balance assessment: History of Falls;Needs assistance Sitting-balance  support: Feet supported Sitting balance-Leahy Scale: Fair     Standing balance support: Bilateral upper extremity supported;During functional activity Standing balance-Leahy Scale: Poor                              Cognition Arousal/Alertness: Awake/alert Behavior During Therapy: WFL for tasks assessed/performed Overall Cognitive Status: Within Functional Limits for tasks assessed                                        Exercises General Exercises - Lower Extremity Ankle Circles/Pumps: AAROM;5 reps Heel Slides: AAROM;10 reps Hip ABduction/ADduction: AAROM;10 reps    General Comments General comments (skin integrity, edema, etc.): improved functionally and was better able to assist standing and moving with LLE giving out on her      Pertinent Vitals/Pain Pain Assessment: No/denies pain Pain Location: back Pain Descriptors / Indicators: Aching Pain Intervention(s): Limited activity within patient's tolerance;Monitored during session;Repositioned    Home Living                      Prior Function            PT Goals (current goals can now be found in the care plan section) Acute Rehab PT Goals Patient Stated Goal: return to walking/PLOF Progress towards PT goals: Progressing toward goals    Frequency    Min 3X/week      PT  Plan Current plan remains appropriate    Co-evaluation              AM-PAC PT "6 Clicks" Mobility   Outcome Measure  Help needed turning from your back to your side while in a flat bed without using bedrails?: A Little Help needed moving from lying on your back to sitting on the side of a flat bed without using bedrails?: A Little Help needed moving to and from a bed to a chair (including a wheelchair)?: A Lot Help needed standing up from a chair using your arms (e.g., wheelchair or bedside chair)?: A Lot Help needed to walk in hospital room?: A Little Help needed climbing 3-5 steps with a railing?  : Total 6 Click Score: 14    End of Session Equipment Utilized During Treatment: Gait belt Activity Tolerance: Patient limited by fatigue;Treatment limited secondary to medical complications (Comment) Patient left: in bed;with call bell/phone within reach;with bed alarm set Nurse Communication: Mobility status PT Visit Diagnosis: Muscle weakness (generalized) (M62.81);Difficulty in walking, not elsewhere classified (R26.2);History of falling (Z91.81)     Time: 0354-6568 PT Time Calculation (min) (ACUTE ONLY): 27 min  Charges:  $Gait Training: 8-22 mins $Therapeutic Exercise: 8-22 mins                   Ramond Dial 07/18/2020, 1:12 PM  Mee Hives, PT MS Acute Rehab Dept. Number: [place]

## 2020-07-18 NOTE — Progress Notes (Signed)
   Subjective:  She reports that her breathing is somewhat improved since yesterday and that her strength is marginally improved. However, she reports worsening dysphagia and an inability to tolerate anything other than clear liquids. She had one aspiration event late in the day yesterday in which her O2 sats dropped into the 7s. She reports no other aspiration events.  She understands the plan to transfer her to [facility] as is amenable to the transfer, but is requesting that we discuss the care plan with her son.   Objective:  Vital signs in last 24 hours: Vitals:   07/17/20 1555 07/17/20 2106 07/18/20 0500 07/18/20 0516  BP:  (!) 103/57  134/78  Pulse:  (!) 103  87  Resp:  17  17  Temp:  98.3 F (36.8 C)  98.2 F (36.8 C)  TempSrc:  Oral  Oral  SpO2: 92% 100%  100%  Weight:   63.5 kg   Height:       Weight change:   Intake/Output Summary (Last 24 hours) at 07/18/2020 1021 Last data filed at 07/18/2020 0913 Gross per 24 hour  Intake 694 ml  Output 400 ml  Net 294 ml   Physical Exam Constitutional:      General: She is not in acute distress. Neck:     Comments: Weakness of neck flexor Cardiovascular:     Rate and Rhythm: Regular rhythm. Tachycardia present.     Pulses: Normal pulses.     Heart sounds: Normal heart sounds.  Pulmonary:     Effort: No respiratory distress.     Breath sounds: Rhonchi present.  Musculoskeletal:     Right lower leg: Edema present.     Left lower leg: Edema present.     Comments: Global muscular weakness. Range of motion severely limited by weakness. <10 degrees of shoulder abduction bilaterally.  UE and LE edema unchanged from yesterday's exam.  Neurological:     Mental Status: She is alert.  Psychiatric:        Mood and Affect: Mood normal.        Thought Content: Thought content normal.        Judgment: Judgment normal.    Assessment/Plan:  Delman Kitten likely secondary to underlying dermatomyositis: - Patient is on [facility]'s  transfer list. We will continue to wait for a bed to become available for her. - On day two of three receiving methylprednisolone 500mg  IV - After course of methylprednisolone, will start her on IVIG per rheumatologist recommendation - Will call her son to report on planned transfer  Aspiration with subsequent opacity noted on CXR - pneumonitis vs. Developing aspiration pneumonia - Will closely monitor her for development of worsening SOB, fever, or white count - If she develops any of these symptoms, will start antibiotics  Malnutrition in the setting of severe dysphagia - Unable to tolerate PO feeds at this time - Will initiate to tube feeds with Cortrak small-bore tube - Reassess serum albumin in the morning    LOS: 2 days   Pearla Dubonnet, Medical Student 07/18/2020, 10:21 AM

## 2020-07-18 NOTE — Progress Notes (Signed)
Noted plans for transfer to [facility] . I will follow. I am holding authorization for CIR admit with [employer].  Danne Baxter, RN, MSN Rehab Admissions Coordinator [phone] 07/18/2020 9:05 AM

## 2020-07-18 NOTE — Progress Notes (Signed)
  Speech Language Pathology Treatment: Dysphagia  Patient Details Name: Haley Johnson MRN: [id number] DOB: [birth year] Today's Date: 07/18/2020 Time: 0370-9643 SLP Time Calculation (min) (ACUTE ONLY): 15 min  Assessment / Plan / Recommendation Clinical Impression  Continued instruction for proper completion of neuromuscular pharyngeal strengthening exercises in setting of pts pharyngeal weakness as evidenced by recent MBSS. Reviewed results of recent MBSS including rationale for current diet modifications and reinforced safe swallowing strategies including swallow x2, intermittent throat clear, small bites and sips. Pt continues to reports some s/sx consistent with reduced swallowing efficiency including post swallow globus sensation. This was improved with use of multiple swallows and use of effortful swallows. Pt with intermittent cough and throat clear with thin and trials of puree bolus as consistent with hx. SLP to follow up for improved swallow safety and efficiency.    HPI HPI: 74yo female admitted 07/15/20 with dysphagia x1 month PMH: dermatomyositis, GERD, HTN, HLD, asthma      SLP Plan  Continue with current plan of care       Recommendations  Diet recommendations: Thin liquid Liquids provided via: Cup;Straw Medication Administration: Other (Comment) (liquid form) Supervision: Patient able to self feed;Intermittent supervision to cue for compensatory strategies Compensations: Slow rate;Small sips/bites;Multiple dry swallows after each bite/sip;Clear throat intermittently Postural Changes and/or Swallow Maneuvers: Seated upright 90 degrees;Upright 30-60 min after meal                Oral Care Recommendations: Oral care BID Follow up Recommendations: Inpatient Rehab SLP Visit Diagnosis: Dysphagia, oropharyngeal phase (R13.12) Plan: Continue with current plan of care       Haley Browning MA, Johnson 07/18/2020, 4:19 PM

## 2020-07-19 LAB — MAGNESIUM: Magnesium: 1.9 mg/dL (ref 1.7–2.4)

## 2020-07-19 LAB — CBC
HCT: 29.1 % — ABNORMAL LOW (ref 36.0–46.0)
Hemoglobin: 9.1 g/dL — ABNORMAL LOW (ref 12.0–15.0)
MCH: 30.6 pg (ref 26.0–34.0)
MCHC: 31.3 g/dL (ref 30.0–36.0)
MCV: 98 fL (ref 80.0–100.0)
Platelets: 146 10*3/uL — ABNORMAL LOW (ref 150–400)
RBC: 2.97 MIL/uL — ABNORMAL LOW (ref 3.87–5.11)
RDW: 16.4 % — ABNORMAL HIGH (ref 11.5–15.5)
WBC: 9.4 10*3/uL (ref 4.0–10.5)
nRBC: 0 % (ref 0.0–0.2)

## 2020-07-19 LAB — GLUCOSE, CAPILLARY
Glucose-Capillary: 113 mg/dL — ABNORMAL HIGH (ref 70–99)
Glucose-Capillary: 109 mg/dL — ABNORMAL HIGH (ref 70–99)
Glucose-Capillary: 122 mg/dL — ABNORMAL HIGH (ref 70–99)
Glucose-Capillary: 114 mg/dL — ABNORMAL HIGH (ref 70–99)

## 2020-07-19 LAB — BASIC METABOLIC PANEL
Anion gap: 10 (ref 5–15)
BUN: 17 mg/dL (ref 8–23)
CO2: 30 mmol/L (ref 22–32)
Calcium: 8.4 mg/dL — ABNORMAL LOW (ref 8.9–10.3)
Chloride: 97 mmol/L — ABNORMAL LOW (ref 98–111)
Creatinine, Ser: 0.59 mg/dL (ref 0.44–1.00)
GFR calc Af Amer: >60 mL/min (ref >60)
GFR calc non Af Amer: >60 mL/min (ref >60)
Glucose, Bld: 138 mg/dL — ABNORMAL HIGH (ref 70–99)
Potassium: 3.4 mmol/L — ABNORMAL LOW (ref 3.5–5.1)
Sodium: 137 mmol/L (ref 135–145)

## 2020-07-19 LAB — PROCALCITONIN: Procalcitonin: 0.45 ng/mL

## 2020-07-19 MED ORDER — PREDNISONE 50 MG PO TABS
60.0000 mg | ORAL_TABLET | Freq: Every day | ORAL | Status: DC
  Filled 2020-07-19: qty 1

## 2020-07-19 NOTE — Progress Notes (Signed)
  Speech Language Pathology Treatment: Dysphagia  Patient Details Name: Haley Johnson MRN: [id number] DOB: 1946-08-21 Today's Date: 07/19/2020 Time: 1100-1130 SLP Time Calculation (min) (ACUTE ONLY): 30 min  Assessment / Plan / Recommendation Clinical Impression  Pt seen at bedside for continued skilled ST intervention for dysphagia. Pt's son was present today. SLP provided written pharyngeal strengthening exercises, and reviewed this information with pt/son. She was encouraged to complete exercises throughout the day taking rest breaks as needed.   Pt verbalized concern of having to "start all over" if she has to transfer to [facility]. She indicated she would prefer to go to CIR, so she can be close to her sons here in [place].  ST will continue to follow pt acutely for education and therapy for dysphagia likely related to dermatomyositis flair.     HPI HPI: 74yo female admitted 07/15/20 with dysphagia x1 month PMH: dermatomyositis, GERD, HTN, HLD, asthma      SLP Plan  Continue with current plan of care       Recommendations  Diet recommendations: Thin liquid Liquids provided via: Cup;Straw Medication Administration: Other (Comment) (liquid form if possible) Supervision: Patient able to self feed;Intermittent supervision to cue for compensatory strategies Compensations: Slow rate;Small sips/bites;Multiple dry swallows after each bite/sip;Clear throat intermittently Postural Changes and/or Swallow Maneuvers: Seated upright 90 degrees;Upright 30-60 min after meal                Oral Care Recommendations: Oral care BID Follow up Recommendations: Inpatient Rehab SLP Visit Diagnosis: Dysphagia, oropharyngeal phase (R13.12) Plan: Continue with current plan of care       GO              Haley Johnson B. Quentin Ore, [facility], [place] Speech Language Pathologist Office: [phone] Pager: [phone]  Haley Johnson 07/19/2020, 11:37 AM

## 2020-07-19 NOTE — Progress Notes (Signed)
Occupational Therapy Treatment Patient Details Name: Haley Johnson MRN: [id number] DOB: 1946/10/17 Today's Date: 07/19/2020    History of present illness 74 yo F with presenting to [facility] w/ dysphagia. Due to weight loss, weakness, and a fall in the parking lot she was admitted to [facility] on 8/23. PMH: asthma, dermatomyositis, GERD, HTN, HLD, hypercholesteremia   OT comments  Pt. Seen for skilled OT treatment session.  Bed mobility with min/mod a to guide trunk upright. Once seated able to maintain un supported sitting without physical assistance while performing grooming tasks.  Min/mod a to bring washcloth to face with RUE.    Follow Up Recommendations  CIR    Equipment Recommendations  Wheelchair (measurements OT);Wheelchair cushion (measurements OT)    Recommendations for Other Services Rehab consult    Precautions / Restrictions Precautions Precautions: Fall Precaution Comments: wounds in peri area       Mobility Bed Mobility Overal bed mobility: Needs Assistance Bed Mobility: Rolling;Supine to Sit;Sit to Supine Rolling: Min assist   Supine to sit: Min assist Sit to supine: Mod assist   General bed mobility comments: pt initiates log rolling with hip and head but unable to lift shoulders to initiate arms. once hand placed over belly on rail attempting to pull. pt requires (A) To elevate trunk from bed surface with minimal UE (A) due to weakness. able to sit un supported eob with no LOB noted  Transfers                      Balance                                           ADL either performed or assessed with clinical judgement   ADL Overall ADL's : Needs assistance/impaired     Grooming: Wash/dry face;Wash/dry hands;Minimal assistance;Moderate assistance;Sitting Grooming Details (indicate cue type and reason): mod hand over hand assistance to guide RUE to face to wash. no assistance with use of L hand. squeezed lotion bottle  with R hand into L hand and B arms for application                               General ADL Comments: sat eob for session.  performed grooming tasks min/mod a.     Vision       Perception     Praxis      Cognition Arousal/Alertness: Awake/alert Behavior During Therapy: WFL for tasks assessed/performed Overall Cognitive Status: Within Functional Limits for tasks assessed                                 General Comments: very motivated and positive attitude throughout session        Exercises     Shoulder Instructions       General Comments      Pertinent Vitals/ Pain       Pain Assessment: No/denies pain  Home Living                                          Prior Functioning/Environment  Frequency  Min 2X/week        Progress Toward Goals  OT Goals(current goals can now be found in the care plan section)  Progress towards OT goals: Progressing toward goals     Plan      Co-evaluation                 AM-PAC OT "6 Clicks" Daily Activity     Outcome Measure   Help from another person eating meals?: A Lot Help from another person taking care of personal grooming?: A Lot Help from another person toileting, which includes using toliet, bedpan, or urinal?: Total Help from another person bathing (including washing, rinsing, drying)?: Total Help from another person to put on and taking off regular upper body clothing?: Total Help from another person to put on and taking off regular lower body clothing?: Total 6 Click Score: 8    End of Session    OT Visit Diagnosis: Unsteadiness on feet (R26.81);Muscle weakness (generalized) (M62.81)   Activity Tolerance Patient tolerated treatment well   Patient Left in bed;with call bell/phone within reach;with bed alarm set;Other (comment) (suction also within reach)   Nurse Communication          Time: [phone] OT Time Calculation (min):  16 min  Charges: OT General Charges $OT Visit: 1 Visit OT Treatments $Self Care/Home Management : 8-22 mins  Sonia Baller, COTA/L Acute Rehabilitation [phone]   Janice Coffin 07/19/2020, 10:36 AM

## 2020-07-19 NOTE — Progress Notes (Signed)
Subjective: Interviewed patient at bedside.  She mentions feeling well and states her symptoms appear to be improving. Discussed current plan to get cortrak in today to start tube feeds and monitor for improvement in her weakness on IVIG.  Objective:  Vital signs in last 24 hours: Vitals:   07/18/20 0516 07/18/20 1443 07/18/20 2045 07/19/20 0448  BP: 134/78 114/67 111/62 136/74  Pulse: 87 73 80 83  Resp: 17 16 17 18   Temp: 98.2 F (36.8 C) 97.9 F (36.6 C) 97.8 F (36.6 C) 98 F (36.7 C)  TempSrc: Oral Oral Oral Oral  SpO2: 100% 100% 100% 100%  Weight:      Height:       Physical Exam Vitals and nursing note reviewed.  Constitutional:      General: She is not in acute distress.    Appearance: She is not toxic-appearing.  HENT:     Head: Normocephalic and atraumatic.  Cardiovascular:     Rate and Rhythm: Normal rate and regular rhythm.     Pulses: Normal pulses.          Radial pulses are 2+ on the right side and 2+ on the left side.       Dorsalis pedis pulses are 2+ on the right side and 2+ on the left side.     Heart sounds: Normal heart sounds, S1 normal and S2 normal. No murmur heard.   Pulmonary:     Effort: Pulmonary effort is normal. No respiratory distress.     Breath sounds: Normal breath sounds. No wheezing.  Abdominal:     General: There is no distension.     Tenderness: There is no abdominal tenderness.  Musculoskeletal:     Right lower leg: No edema.     Left lower leg: No edema.  Neurological:     Mental Status: She is alert.     Comments: Global strength has been increasing 3+ or 4- /5  Psychiatric:        Mood and Affect: Mood normal.        Behavior: Behavior normal.        Thought Content: Thought content normal.      Assessment/Plan:  Active Problems:   Fall at home, initial encounter   Fall (on)(from) sidewalk curb, subsequent encounter   Dysphagia   Gastroesophageal reflux disease   DDD (degenerative disc disease), lumbosacral    Abnormality of gait   Malnutrition of moderate degree   Mrs. Shadix is a 74 yr old female with PMHx of dermatomyositis, GERD, HTN, HLD, and Asthma admitted with Dermatomyositis flair and dysphagia.   Delman Kitten Secondary to Dermatomyositis Flair: -Patient is on [facility]'s transfer list. We will continue to wait for a bed to become available for her. -On day three of three receiving methylprednisolone 500mg  IV -Will start IVIG tomorrow   Aspiration with subsequent opacity noted on CXR -pneumonitis vs. Developing aspiration pneumonia -Will closely monitor her for development of worsening SOB, fever, or white count -If she develops any of these symptoms, will start antibiotics   Malnutrition in the setting of severe dysphagia: Unable to tolerate PO feeds at this time. Will have CoreTrack placed today and start tube feedings. Will have daily BMP, Mag and Phos drawn to monitor for refeeding syndrome -Will initiate to tube feeds with Cortrak small-bore tube -Continue monitoring labs   Prior to Admission Living Arrangement: Home Anticipated Discharge Location: [facility] Barriers to Discharge: Transfer to [facility] Dispo: Anticipated discharge in approximately 2-8 day(s).  Briant Cedar, MD 07/19/2020, 6:47 AM Pager: [phone] After 5pm on weekdays and 1pm on weekends: On Call pager [phone]

## 2020-07-19 NOTE — Progress Notes (Signed)
Inpatient Rehabilitation Admissions Coordinator  I will follow up on pt's progress next week if remains at [facility].  Danne Baxter, RN, MSN Rehab Admissions Coordinator [phone] 07/19/2020 12:07 PM

## 2020-07-19 NOTE — Progress Notes (Signed)
Nutrition Follow-up  DOCUMENTATION CODES:   Non-severe (moderate) malnutrition in context of chronic illness  INTERVENTION:   -Monitor Vitamin C, Vitamin D, B vitamins, thiamine, and copper levels and replete as needed; MD has ordered labs -RD will follow for diet advancement and adjust supplement regimen as appropriate -Continue 15 ml MVI daily -Once cortrak is placed:  Initiate Osmolite 1.5 @ 20 ml/hr via cortrak tube and increase by 10 ml every 8 hours to goal rate of 50 ml/hr.   45 ml Prosource TF BID  150 ml free water flush every 4 hours   Tube feeding regimen provides 1880 kcal (100% of needs), 97 grams of protein, and 914 ml of H2O. Total free water: 1814 ml/day  -Monitor K, Mg, and Phos daily and replete as needed due to high risk of refeeding syndrome  NUTRITION DIAGNOSIS:   Moderate Malnutrition related to chronic illness (dermatomyositis) as evidenced by energy intake < or equal to 75% for > or equal to 1 month, mild fat depletion, moderate fat depletion, mild muscle depletion, moderate muscle depletion, percent weight loss.  Ongoing  GOAL:   Patient will meet greater than or equal to 90% of their needs  Progressing   MONITOR:   PO intake, Supplement acceptance, Diet advancement, Labs, Weight trends, Skin, I & O's  REASON FOR ASSESSMENT:   Malnutrition Screening Tool    ASSESSMENT:   Haley Johnson is a 74 yo F with PMHx of dermatomyositis, GERD, HTN, HLD, and Asthma presenting to [facility] w/ dysphagia. Due to weight loss, weakness, and a fall in the parking lot she was admitted. She states having difficulty with swallowing and eating for about 1 month. She states she coughs up her food because she cannot swallow and also have difficulty with chewing her food. She states that she also has sores in her mouth that cause her to have pain with chewing  8/24- s/p MBSS- advanced to full liquid diet 8/25- s/p BSE- downgraded to clear liquid  diet  Reviewed I/O's: -720 ml x 24 hours and -339 ml since admission  Spoke with pt and son at bedside. Pt pleasant and in good spirits and looks better than compared to previous visit 2 days ago. Pt endorses continued difficulty swallowing and discomfort when eating. She is trying to consume clear liquids.   Discussed with pt plan to place cortrak tube to help with nutritional intake. Explained for pt how tube will be placed and how she will receive the majority of her nutrition. Encouraged pt that she could eat orally if desired. Pt and son very appreciative of explanation and are agreeable with proceeding with TF.   Medications reviewed and include IV solu-medrol.   Labs reviewed: Vitamin B-12: 1150, Vitamin D: 25, CBGS: 153 (inpatient orders for glycemic control are 0-15 units insulin aspart TID with meals).   Diet Order:   Diet Order            Diet clear liquid Room service appropriate? Yes; Fluid consistency: Thin  Diet effective now                 EDUCATION NEEDS:   Education needs have been addressed  Skin:  Skin Assessment: Skin Integrity Issues: Skin Integrity Issues:: Other (Comment) Other: MASD labia  Last BM:  07/19/20  Height:   Ht Readings from Last 1 Encounters:  07/15/20 5\' 2"  (1.575 m)    Weight:   Wt Readings from Last 1 Encounters:  07/18/20 63.5 kg  Ideal Body Weight:  50 kg  BMI:  Body mass index is 25.6 kg/m.  Estimated Nutritional Needs:   Kcal:  1850-2050  Protein:  95-110 grams  Fluid:  > 1.8 L    Haley Johnson, RD, LDN, [place] Registered Dietitian II Certified Diabetes Care and Education Specialist Please refer to [facility] for RD and/or RD on-call/weekend/after hours pager

## 2020-07-19 NOTE — Procedures (Signed)
Cortrak  Person Inserting Tube:  Johnson, Haley Johnson, RD Tube Type:  Cortrak - 43 inches Tube Location:  Left nare Initial Placement:  Stomach Secured by: Bridle Technique Used to Measure Tube Placement:  Documented cm marking at nare/ corner of mouth Cortrak Secured At:  64 cm    Cortrak Tube Team Note:  Consult received to place a Cortrak feeding tube.   No x-ray is required. RN may begin using tube.   If the tube becomes dislodged please keep the tube and contact the Cortrak team at [email or web address] ([email or web address]) for replacement.  If after hours and replacement cannot be delayed, place a NG tube and confirm placement with an abdominal x-ray.    Haley Macphail King, MS, RD, LDN Pager number available on Amion 

## 2020-07-20 DIAGNOSIS — E440 Moderate protein-calorie malnutrition: Secondary | ICD-10-CM

## 2020-07-20 LAB — GLUCOSE, CAPILLARY
Glucose-Capillary: 117 mg/dL — ABNORMAL HIGH (ref 70–99)
Glucose-Capillary: 204 mg/dL — ABNORMAL HIGH (ref 70–99)
Glucose-Capillary: 188 mg/dL — ABNORMAL HIGH (ref 70–99)
Glucose-Capillary: 185 mg/dL — ABNORMAL HIGH (ref 70–99)

## 2020-07-20 LAB — BASIC METABOLIC PANEL
Anion gap: 6 (ref 5–15)
BUN: 15 mg/dL (ref 8–23)
CO2: 32 mmol/L (ref 22–32)
Calcium: 8.4 mg/dL — ABNORMAL LOW (ref 8.9–10.3)
Chloride: 100 mmol/L (ref 98–111)
Creatinine, Ser: 0.56 mg/dL (ref 0.44–1.00)
GFR calc Af Amer: >60 mL/min (ref >60)
GFR calc non Af Amer: >60 mL/min (ref >60)
Glucose, Bld: 122 mg/dL — ABNORMAL HIGH (ref 70–99)
Potassium: 3.2 mmol/L — ABNORMAL LOW (ref 3.5–5.1)
Sodium: 138 mmol/L (ref 135–145)

## 2020-07-20 LAB — MAGNESIUM: Magnesium: 1.8 mg/dL (ref 1.7–2.4)

## 2020-07-20 LAB — PHOSPHORUS: Phosphorus: 1.3 mg/dL — ABNORMAL LOW (ref 2.5–4.6)

## 2020-07-20 MED ORDER — MYCOPHENOLATE 200 MG/ML ORAL SUSPENSION
1000.0000 mg | Freq: Two times a day (BID) | ORAL | Status: DC
  Administered 2020-07-20 (×2): 1000 mg
  Filled 2020-07-20 (×3): qty 20

## 2020-07-20 MED ORDER — PREGABALIN 100 MG PO CAPS
100.0000 mg | ORAL_CAPSULE | Freq: Three times a day (TID) | ORAL | Status: DC
  Administered 2020-07-20 (×3): 100 mg
  Filled 2020-07-20 (×3): qty 1

## 2020-07-20 MED ORDER — SODIUM PHOSPHATES 45 MMOLE/15ML IV SOLN: 30.0000 mmol | Freq: Once | INTRAVENOUS | Status: DC

## 2020-07-20 MED ORDER — MAGNESIUM SULFATE 2 GM/50ML IV SOLN
2.0000 g | Freq: Once | INTRAVENOUS | Status: AC
  Administered 2020-07-20: 2 g via INTRAVENOUS
  Filled 2020-07-20: qty 50

## 2020-07-20 MED ORDER — ACETAMINOPHEN 650 MG RE SUPP: 650.0000 mg | Freq: Four times a day (QID) | RECTAL | Status: DC | PRN

## 2020-07-20 MED ORDER — GLUCERNA SHAKE PO LIQD: 237.0000 mL | Freq: Three times a day (TID) | ORAL | 0 refills | Status: AC

## 2020-07-20 MED ORDER — ADULT MULTIVITAMIN LIQUID CH
15.0000 mL | Freq: Every day | ORAL | Status: DC
  Administered 2020-07-20: 15 mL
  Filled 2020-07-20 (×2): qty 15

## 2020-07-20 MED ORDER — MYCOPHENOLATE 200 MG/ML ORAL SUSPENSION
1000.0000 mg | Freq: Two times a day (BID) | ORAL | Status: DC
  Filled 2020-07-20: qty 20

## 2020-07-20 MED ORDER — PANTOPRAZOLE SODIUM 40 MG PO PACK
40.0000 mg | PACK | Freq: Every day | ORAL | Status: DC
  Administered 2020-07-20: 40 mg
  Filled 2020-07-20 (×2): qty 20

## 2020-07-20 MED ORDER — ROSUVASTATIN CALCIUM 20 MG PO TABS
40.0000 mg | ORAL_TABLET | Freq: Every day | ORAL | Status: DC
  Administered 2020-07-20: 40 mg
  Filled 2020-07-20: qty 2

## 2020-07-20 MED ORDER — POTASSIUM PHOSPHATES 15 MMOLE/5ML IV SOLN
30.0000 mmol | Freq: Once | INTRAVENOUS | Status: AC
  Administered 2020-07-20: 30 mmol via INTRAVENOUS
  Filled 2020-07-20: qty 10

## 2020-07-20 MED ORDER — PREDNISONE 5 MG/5ML PO SOLN
60.0000 mg | Freq: Every day | ORAL | Status: DC
  Administered 2020-07-20: 60 mg
  Filled 2020-07-20 (×2): qty 60

## 2020-07-20 MED ORDER — ACETAMINOPHEN 325 MG PO TABS
650.0000 mg | ORAL_TABLET | Freq: Four times a day (QID) | ORAL | Status: DC | PRN
  Administered 2020-07-20 (×2): 650 mg
  Filled 2020-07-20: qty 2

## 2020-07-20 MED ORDER — ONDANSETRON HCL 4 MG/2ML IJ SOLN: 4.0000 mg | Freq: Four times a day (QID) | INTRAMUSCULAR | Status: DC | PRN

## 2020-07-20 MED ORDER — POTASSIUM CHLORIDE 10 MEQ/100ML IV SOLN
10.0000 meq | INTRAVENOUS | Status: DC
  Administered 2020-07-20 (×2): 10 meq via INTRAVENOUS
  Filled 2020-07-20 (×4): qty 100

## 2020-07-20 MED ORDER — SENNOSIDES 8.8 MG/5ML PO SYRP
5.0000 mL | ORAL_SOLUTION | Freq: Two times a day (BID) | ORAL | Status: DC
  Administered 2020-07-20: 5 mL
  Filled 2020-07-20 (×3): qty 5

## 2020-07-20 MED ORDER — ONDANSETRON HCL 4 MG PO TABS: 4.0000 mg | ORAL_TABLET | Freq: Four times a day (QID) | ORAL | Status: DC | PRN

## 2020-07-20 NOTE — Progress Notes (Signed)
Physical Therapy Treatment Patient Details Name: Haley Johnson MRN: [id number] DOB: 07/06/1946 Today's Date: 07/20/2020    History of Present Illness 74 yo F with presenting to [facility] w/ dysphagia. Due to weight loss, weakness, and a fall in the parking lot she was admitted to [facility] on 8/23. PMH: asthma, dermatomyositis, GERD, HTN, HLD, hypercholesteremia    PT Comments    Pt supine in bed on arrival.  Pt very motivated to mobilize, however in standing she started to have bouts of urinary and bowel incontinence.  Attempted to get to the bathroom where she became more fatigued and L knee started to buckle.  PTA removed foot board of bed and assisted patient to sit edge of bed at the foot of the bed.  Continue to recommend aggressive rehab at CIR to maximize functional gains, improve strength and decrease caregiver burden before returning home.  Pt remains a very high fall risk.     Follow Up Recommendations  [facility]     Equipment Recommendations  None recommended by PT    Recommendations for Other Services       Precautions / Restrictions Precautions Precautions: Fall Precaution Comments: wounds in peri area Restrictions Weight Bearing Restrictions: No    Mobility  Bed Mobility Overal bed mobility: Needs Assistance Bed Mobility: Supine to Sit     Supine to sit: Mod assist     General bed mobility comments: Mod assistance to advance B LEs to edge of bed and elevate trunk into a seated position.  Transfers Overall transfer level: Needs assistance Equipment used: Rolling walker (2 wheeled) Transfers: Sit to/from Stand Sit to Stand: Min assist;From elevated surface;+2 safety/equipment         General transfer comment: Min +1 intially, as she fatigued she required +2 min assistance.  Pt fatiguing during gt training and required max assistance to return to sitting to avoid sitting prematurely.  Ambulation/Gait Ambulation/Gait assistance: Mod assist;Max  assist Gait Distance (Feet): 12 Feet Assistive device: Rolling walker (2 wheeled) Gait Pattern/deviations: Step-to pattern;Wide base of support;Decreased stride length;Decreased dorsiflexion - left Gait velocity: reduced   General Gait Details: Pt with L knee buckle as she started to fatigue.  Pt required increased assistance due to fatigue.  She was on her way to the bathroom but due to quick fatigue, PTA removed foot board and assisted patient to sit edge of foot of bed until more help arrived to attempt standing to move to a more permanent location to rest.   Stairs             Wheelchair Mobility    Modified Rankin (Stroke Patients Only)       Balance Overall balance assessment: History of Falls;Needs assistance Sitting-balance support: Feet supported Sitting balance-Leahy Scale: Fair       Standing balance-Leahy Scale: Poor (but quickly became zero with L knee buckling.) Standing balance comment: reliants on therapists and external support                            Cognition Arousal/Alertness: Awake/alert Behavior During Therapy: WFL for tasks assessed/performed Overall Cognitive Status: Within Functional Limits for tasks assessed                                 General Comments: very motivated and positive attitude throughout session      Exercises  General Comments        Pertinent Vitals/Pain Pain Assessment: No/denies pain Pain Score: 3  Pain Location: back and peri area Pain Descriptors / Indicators: Aching Pain Intervention(s): Monitored during session;Repositioned    Home Living                      Prior Function            PT Goals (current goals can now be found in the care plan section) Acute Rehab PT Goals Patient Stated Goal: return to walking/PLOF Potential to Achieve Goals: Good Progress towards PT goals: Progressing toward goals    Frequency    Min 3X/week      PT Plan Current  plan remains appropriate    Co-evaluation              AM-PAC PT "6 Clicks" Mobility   Outcome Measure  Help needed turning from your back to your side while in a flat bed without using bedrails?: A Lot Help needed moving from lying on your back to sitting on the side of a flat bed without using bedrails?: A Lot Help needed moving to and from a bed to a chair (including a wheelchair)?: A Little Help needed standing up from a chair using your arms (e.g., wheelchair or bedside chair)?: A Little Help needed to walk in hospital room?: Total Help needed climbing 3-5 steps with a railing? : Total 6 Click Score: 12    End of Session Equipment Utilized During Treatment: Gait belt Activity Tolerance: Patient limited by fatigue;Treatment limited secondary to medical complications (Comment) Patient left: with call bell/phone within reach;in chair;with chair alarm set Nurse Communication: Mobility status PT Visit Diagnosis: Muscle weakness (generalized) (M62.81);Difficulty in walking, not elsewhere classified (R26.2);History of falling (Z91.81)     Time: 8676-7209 PT Time Calculation (min) (ACUTE ONLY): 31 min  Charges:  $Gait Training: 8-22 mins $Therapeutic Activity: 8-22 mins                     Erasmo Leventhal , PTA Acute Rehabilitation Services Pager [phone] Office [phone]     Tashunda Vandezande Eli Hose 07/20/2020, 4:39 PM

## 2020-07-20 NOTE — Progress Notes (Signed)
Subjective: Interviewed patient at bedside.  Mentions feeling well this am. Mentions having some leakage from the her IV.  Discussed continuing steroid therapy and starting IVIG today. Discussed possible side effects and advised to inform clinical team promptly in setting of side effects.  Objective:  Vital signs in last 24 hours: Vitals:   07/19/20 0448 07/19/20 1500 07/19/20 2007 07/20/20 0408  BP: 136/74 117/62 115/60 115/61  Pulse: 83 76 78 80  Resp: 18 19 18 18   Temp: 98 F (36.7 C) 98.3 F (36.8 C) 98.1 F (36.7 C) 98.2 F (36.8 C)  TempSrc: Oral  Oral Oral  SpO2: 100% 100% 100% 100%  Weight:      Height:       Physical Exam Vitals and nursing note reviewed.  Constitutional:      General: She is not in acute distress.    Appearance: Normal appearance. She is not ill-appearing or toxic-appearing.  HENT:     Head: Normocephalic and atraumatic.  Cardiovascular:     Rate and Rhythm: Normal rate and regular rhythm.     Pulses: Normal pulses.          Radial pulses are 2+ on the right side and 2+ on the left side.       Dorsalis pedis pulses are 2+ on the right side and 2+ on the left side.     Heart sounds: Normal heart sounds, S1 normal and S2 normal. No murmur heard.   Pulmonary:     Effort: Pulmonary effort is normal. No respiratory distress.     Breath sounds: Normal breath sounds. No wheezing.  Musculoskeletal:     Right lower leg: No edema.     Left lower leg: No edema.  Neurological:     Mental Status: She is alert.  Psychiatric:        Mood and Affect: Mood normal.        Behavior: Behavior normal.        Thought Content: Thought content normal.      Assessment/Plan:  Active Problems:   Fall at home, initial encounter   Fall (on)(from) sidewalk curb, subsequent encounter   Dysphagia   Gastroesophageal reflux disease   DDD (degenerative disc disease), lumbosacral   Abnormality of gait   Malnutrition of moderate degree   Mrs. Kubicek is a 74 yr  old femalewith PMHx ofdermatomyositis, GERD, HTN, HLD, and Asthmaadmitted with Dermatomyositis flair and dysphagia.   Delman Kitten Secondary to Dermatomyositis Flair: Has completed pulse course of steroids. Will start Prednisone and IVIG today. Patient is on [facility]'s transfer list, will continue to wait for a bed to become available for her. Continue working with SLP for dysphagia training. -Finished 3 day Methylprednisolone 500mg  IV pulse course yesterday -Will start IVIG today (day 1 of 5) -Will start 60 mg Prednisone today -Continue working with SLP    Aspiration with subsequent opacity noted on CXR: Had episode of aspiration while working with SLP. Immediately cleared and appears to have no issues. WBC is normal. Has remained afebrile. Low threshold for Chest X and initiating antibiotics. -Closely monitor for development of worsening SOB, fever, or white count -Low threshold to start antibiotics -Low threshold to obtain X-ray   Malnutrition in the setting of severe dysphagia: Unable to tolerate PO feeds at this time. CoreTrack placed yesterday and tube feedings started. Will have daily BMP, Mag and Phos drawn to monitor for refeeding syndrome. Continue working with SLP for dysphagia training. -Will initiate to tube feeds with  Cortrak small-bore tube -Continue monitoring labs -Continue working with SLP   DVT: SCD IVF: None CODE: Full   Prior to Admission Living Arrangement: Home Anticipated Discharge Location: [facility] vs [facility] Barriers to Discharge: [facility] transfer Dispo: Anticipated discharge in approximately 1-5 day(s).   Briant Cedar, MD 07/20/2020, 6:01 AM Pager: [phone] After 5pm on weekdays and 1pm on weekends: On Call pager [phone]

## 2020-07-20 NOTE — Discharge Summary (Addendum)
Name: Haley Johnson MRN: [id number] DOB: 12-10-1945 74 y.o. PCP: Haley Lodge, MD  Date of Admission: 07/15/2020  5:07 PM Date of Discharge:  07/20/2020 Attending Physician: Haley Brothers, MD  Discharge Diagnosis: 1. Haley Johnson 2/2 Dermatomyositis flare 2. Malnutrition in the setting of severe dysphagia 3. Aspiration with opacity noted on CXR  Discharge Medications: Allergies as of 07/20/2020       Reactions   Chocolate Swelling   Blueberry Flavor Swelling   Face swells   Tomato Swelling   Face swelling        Medication List     STOP taking these medications    oxyCODONE-acetaminophen 5-325 MG tablet Commonly known as: PERCOCET/ROXICET       TAKE these medications    albuterol 108 (90 Base) MCG/ACT inhaler Commonly known as: VENTOLIN HFA Inhale 1-2 puffs into the lungs every 6 (six) hours as needed for wheezing or shortness of breath.   amLODipine 5 MG tablet Commonly known as: NORVASC Take 5 mg by mouth daily.   bisoprolol-hydrochlorothiazide 2.5-6.25 MG tablet Commonly known as: ZIAC Take 1 tablet by mouth daily.   DESITIN MAXIMUM STRENGTH EX Apply 1 application topically daily as needed (rash prevention).   Dulera 200-5 MCG/ACT Aero Generic drug: mometasone-formoterol Inhale 2 puffs into the lungs daily as needed for shortness of breath or wheezing.   etodolac 400 MG tablet Commonly known as: LODINE Take 400 mg by mouth 2 (two) times daily.   famotidine 40 MG tablet Commonly known as: PEPCID Take 40 mg by mouth daily.   feeding supplement (GLUCERNA SHAKE) Liqd Take 237 mLs by mouth 3 (three) times daily between meals. What changed: when to take this   mycophenolate 500 MG tablet Commonly known as: CELLCEPT Take 1,000 mg by mouth 2 (two) times daily.   omeprazole 40 MG capsule Commonly known as: PRILOSEC TAKE 1 CAPSULE(40 MG) BY MOUTH DAILY What changed: See the new instructions.   predniSONE 10 MG tablet Commonly  known as: DELTASONE Take 1 tablet (10 mg total) by mouth daily.   pregabalin 100 MG capsule Commonly known as: Lyrica Take 1 capsule (100 mg total) by mouth 3 (three) times daily.   rosuvastatin 40 MG tablet Commonly known as: CRESTOR Take 40 mg by mouth daily.   vitamin A & D ointment Apply 1 application topically as needed for dry skin.        Disposition and follow-up:   [name] was discharged from [facility] in [place] condition.  At the hospital follow up visit please address:  1.  Haley Johnson 2/2 dermatomyositis flare: Completed 3 day course of pulse steroids w/solumedrol (last dose on 8/27). Started prednisone 60mg  today. Started IVIG today (day 1 of 5). Will be transferred to [facility] for further management by patient's primary rheumatologist, Dr. Thelma Johnson. Monitor for IVIG side effects.  Malnutrition 2/2 severe dysphagia: Coretrack in place for tube feeds. Will need to work with SLP for dysphagia training. Monitor for refeeding syndrome.  Aspiration event: likely 2/2 dysphagia. No fevers at this time. Leukocytosis has resolved. No indications for antibiotics at this time.   2.  Labs / imaging needed at time of follow-up: CBC, BMP  3.  Pending labs/ test needing follow-up: Vitamin B1, B6, C and serum Copper  Follow-up Appointments:    Hospital Course by problem list: 1. Weakness and dysphagia 2/2 dermatomyositis flare: Patient presented with worsening weakness, weight loss, and dysphagia along with worsening leg swelling over the past month likely  2/2 dermatomyositis flare. CK 153. CBC showing stable hemoglobin at 11. CMP showing albumin 2.2. Barium esophagram showing prominent esophageal dysmotility. SLP followed and began pharyngeal strengthening exercises. Patient received 3-day course of pulse steroids with solumedrol 500mg  IV with last dose on 07/19/20. Patient started on prednisone 60mg  on 07/20/20. Also started on IVIG on 07/20/20  (day 1 of 5).   2. Malnutrition 2/2 dysphagia: Patient with malnutrition 2/2 dysphagia. Coretrack placed 07/20/20 for tube feedings. Tolerating tube feeds well today. Monitor for refeeding syndrome given malnutrtion.  3. Aspiration event: On 07/17/20, had an episode where O2 sats dropped into 70s after working with SLP. CXR done at that time showed opacity on left side likely 2/2 aspiration 2/2 severe dysphagia. No fevers and has had resolution of leukocytosis. No indications for antibiotics at this time. Continues to remain high risk for aspiration so monitor closely.  Discharge Vitals:   BP (!) 149/72 (BP Location: Right Arm)   Pulse 78   Temp 98.2 F (36.8 C)   Resp 17   Ht 5\' 2"  (1.575 m)   Wt 63.5 kg   SpO2 100%   BMI 25.60 kg/m   Pertinent Labs, Studies, and Procedures:  CBC Latest Ref Rng & Units 07/19/2020 07/18/2020 07/16/2020  WBC 4.0 - 10.5 K/uL 9.4 12.5(H) 7.4  Hemoglobin 12.0 - 15.0 g/dL 9.1(L) 9.5(L) 9.4(L)  Hematocrit 36 - 46 % 29.1(L) 29.8(L) 28.9(L)  Platelets 150 - 400 K/uL 146(L) 141(L) 141(L)   CMP Latest Ref Rng & Units 07/20/2020 07/19/2020 07/18/2020  Glucose 70 - 99 mg/dL 122(H) 138(H) 182(H)  BUN 8 - 23 mg/dL 15 17 23   Creatinine 0.44 - 1.00 mg/dL 0.56 0.59 0.76  Sodium 135 - 145 mmol/L 138 137 136  Potassium 3.5 - 5.1 mmol/L 3.2(L) 3.4(L) 4.2  Chloride 98 - 111 mmol/L 100 97(L) 96(L)  CO2 22 - 32 mmol/L 32 30 29  Calcium 8.9 - 10.3 mg/dL 8.4(L) 8.4(L) 8.2(L)  Total Protein 6.5 - 8.1 g/dL - - 6.0(L)  Total Bilirubin 0.3 - 1.2 mg/dL - - 0.5  Alkaline Phos 38 - 126 U/L - - 64  AST 15 - 41 U/L - - 64(H)  ALT 0 - 44 U/L - - 33   BNP    Component Value Date/Time   BNP 180.0 (H) 07/15/2020 1815   Total CK - 215  Vitamin B12 - 1,130 Procalcitonin - 0.45  Discharge Instructions: Discharge Instructions     No wound care   Complete by: As directed        Signed: Virl Axe, MD 07/20/2020, 11:02 PM   Pager: [phone]

## 2020-07-21 LAB — VITAMIN B6: Vitamin B6: 2.1 ug/L (ref 2.0–32.8)

## 2020-07-21 LAB — VITAMIN B1: Vitamin B1 (Thiamine): 82.8 nmol/L (ref 66.5–200.0)

## 2020-07-21 LAB — GLUCOSE, CAPILLARY: Glucose-Capillary: 137 mg/dL — ABNORMAL HIGH (ref 70–99)

## 2020-07-21 NOTE — Progress Notes (Signed)
8/28 2245- Report given to Vincente Poli at [facility].Will arranged transportation. 0400- [facility] is here to pick up pt. Latest v/s BP 156/65, P74, RR 17, ) 02 sats 995% in 2L/[place]. All paper works sent to [employer], contracting.

## 2020-07-24 LAB — VITAMIN C: Vitamin C: 1.6 mg/dL (ref 0.4–2.0)

## 2020-07-24 LAB — COPPER, SERUM: Copper: 97 ug/dL (ref 80–158)

## 2020-07-25 ENCOUNTER — Ambulatory Visit (HOSPITAL_COMMUNITY): Payer: Medicare PPO

## 2020-07-25 ENCOUNTER — Encounter (HOSPITAL_COMMUNITY): Payer: Medicare PPO

## 2020-07-31 ENCOUNTER — Encounter: Payer: Medicare PPO | Admitting: Student

## 2020-08-02 NOTE — Addendum Note (Signed)
Addended by: Yvonna Alanis E on: 08/02/2020 01:50 PM   Modules accepted: Orders

## 2020-08-05 ENCOUNTER — Other Ambulatory Visit: Payer: Self-pay | Admitting: Internal Medicine

## 2020-08-05 ENCOUNTER — Other Ambulatory Visit: Payer: Self-pay | Admitting: Student

## 2020-08-05 DIAGNOSIS — C579 Malignant neoplasm of female genital organ, unspecified: Secondary | ICD-10-CM | POA: Insufficient documentation

## 2020-08-05 DIAGNOSIS — R600 Localized edema: Secondary | ICD-10-CM

## 2020-08-05 NOTE — Telephone Encounter (Signed)
Kay--I am unable to remove this order from her encounter. Can you remove it?

## 2020-08-05 NOTE — Telephone Encounter (Signed)
Pt is currently hospitalized at [facility]. Will decline refill at this time as she is intermittently hypotensive and is not receiving it there. Pt will need post hospital appt after discharge at which time this can be reassessed.

## 2020-11-26 DIAGNOSIS — Z1501 Genetic susceptibility to malignant neoplasm of breast: Secondary | ICD-10-CM | POA: Insufficient documentation

## 2020-11-28 ENCOUNTER — Telehealth: Payer: Self-pay

## 2020-11-28 NOTE — Telephone Encounter (Signed)
Son is requesting is amLODipine (NORVASC) 5 MG tablet  Sent  To  [id number] Ginette Otto, [place] - [address] AT [facility] & [address] Phone:  [phone]  Fax:  [phone]     ( pt son also requesting a call back he has another medication that needs to be filled but he is not sure of the name pt has enough for today )

## 2020-11-28 NOTE — Telephone Encounter (Signed)
Returned call to patient's son. States he had [facility] doctor refill all patient's meds so no refills needed from [facility] at this time. He states she has recently been dx with cancer and is receiving chemo q 21 days. He would like patient to f/u at [facility] when that is finished in March. He will call back in February to schedule f/u appt as March schedule is not out yet. Kinnie Feil, BSN, RN-BC

## 2020-12-27 ENCOUNTER — Other Ambulatory Visit: Payer: Self-pay | Admitting: Internal Medicine

## 2020-12-27 DIAGNOSIS — B0229 Other postherpetic nervous system involvement: Secondary | ICD-10-CM

## 2020-12-30 ENCOUNTER — Other Ambulatory Visit: Payer: Self-pay

## 2020-12-30 DIAGNOSIS — B0229 Other postherpetic nervous system involvement: Secondary | ICD-10-CM

## 2020-12-30 NOTE — Telephone Encounter (Signed)
Pt son is requesting her pregabalin (LYRICA) 100 MG capsule ,predniSONE (DELTASONE) 10 MG tablet  Sent to  [facility] [id number], [place] Phone:  [phone]  Fax:  [phone]      ( pt son stated that pt is still going to treatment at [facility] so she is unable to come in for any appt at the time ... but is requesting a 90 day supply of each medicine ... Please )

## 2021-02-01 ENCOUNTER — Other Ambulatory Visit: Payer: Self-pay | Admitting: Internal Medicine

## 2021-02-01 DIAGNOSIS — B0229 Other postherpetic nervous system involvement: Secondary | ICD-10-CM

## 2021-02-03 ENCOUNTER — Encounter: Payer: Self-pay | Admitting: Student

## 2021-02-03 NOTE — Telephone Encounter (Signed)
Last visit 07/15/2020  No future visits scheduled  Message sent to front office to assist with scheduling   See note below copied from previous encounter  11/28/20 3:50 PM Note Returned call to patient's son. States he had [facility] doctor refill all patient's meds so no refills needed from [facility] at this time. He states she has recently been dx with cancer and is receiving chemo q 21 days. He would like patient to f/u at [facility] when that is finished in March. He will call back in February to schedule f/u appt as March schedule is not out yet. Hubbard Hartshorn, BSN, RN-BC

## 2021-02-04 ENCOUNTER — Other Ambulatory Visit: Payer: Self-pay

## 2021-02-04 DIAGNOSIS — B0229 Other postherpetic nervous system involvement: Secondary | ICD-10-CM

## 2021-02-04 NOTE — Telephone Encounter (Signed)
Pt  Son is requesting a call back about medication . He stated that he knows they havent been here in a while and it is due to his mom is still going to [facility] but he needs refills

## 2021-02-04 NOTE — Telephone Encounter (Signed)
Return call to pt's son, Rayvone - requesting a refill on Lyrica. States he's aware pt needs to schedule an appt ; but he's requesting to wait until after his mother's cancer treatments at [facility] has finished; I asked when - states he doesn't know yet. She's receiving treatment every 21 days. He's inform  request may be denied since unable to schedule an appt at this time. He states he will ask the doctor at [facility].

## 2021-02-06 MED ORDER — PREGABALIN 100 MG PO CAPS: ORAL_CAPSULE | ORAL | 0 refills | Status: DC

## 2021-03-27 ENCOUNTER — Encounter: Payer: Self-pay | Admitting: Internal Medicine

## 2021-03-27 ENCOUNTER — Encounter: Payer: Self-pay | Admitting: *Deleted

## 2021-03-27 ENCOUNTER — Other Ambulatory Visit: Payer: Self-pay | Admitting: *Deleted

## 2021-03-27 DIAGNOSIS — B0229 Other postherpetic nervous system involvement: Secondary | ICD-10-CM

## 2021-03-27 MED ORDER — PREGABALIN 100 MG PO CAPS: ORAL_CAPSULE | ORAL | 0 refills | Status: DC

## 2021-03-27 NOTE — Progress Notes (Signed)
Things That May Be Affecting Your Health:  Alcohol  Hearing loss  Pain    Depression  Home Safety  Sexual Health   Diabetes  Lack of physical activity  Stress  x Difficulty with daily activities  Loneliness  Tiredness   Drug use  Medicines  Tobacco use  x Falls  Motor Vehicle Safety  Weight  x Food choices  Oral Health  Other    YOUR PERSONALIZED HEALTH PLAN : 1. Schedule your next subsequent Medicare Wellness visit in one year 2. Attend all of your regular appointments to address your medical issues 3. Complete the preventative screenings and services   Annual Wellness Visit   Medicare Covered Preventative Screenings and [place] Men and Women Who How Often Need? Date of Last Service Action  Abdominal Aortic Aneurysm Adults with AAA risk factors Once      Alcohol Misuse and Counseling All Adults Screening once a year if no alcohol misuse. Counseling up to 4 face to face sessions.     Bone Density Measurement  Adults at risk for osteoporosis Once every 2 yrs x     Lipid Panel Z13.6 All adults without CV disease Once every 5 yrs x      Colorectal Cancer   Stool sample or  Colonoscopy All adults 68 and older   Once every year  Every 10 years x       Depression All Adults Once a year  Today   Diabetes Screening Blood glucose, post glucose load, or GTT Z13.1  All adults at risk  Pre-diabetics  Once per year  Twice per year      Diabetes  Self-Management Training All adults Diabetics 10 hrs first year; 2 hours subsequent years. Requires Copay     Glaucoma  Diabetics  Family history of glaucoma  African Americans 53 yrs +  Hispanic Americans 27 yrs + Annually - requires coppay      Hepatitis C Z72.89 or F19.20  High Risk for HCV  Born between 1945 and 1965  Annually  Once      HIV Z11.4 All adults based on risk  Annually btw ages 65 & 102 regardless of risk  Annually > 65 yrs if at increased risk      Lung Cancer Screening  Asymptomatic adults aged 74-77 with 30 pack yr history and current smoker OR quit within the last 15 yrs Annually Must have counseling and shared decision making documentation before first screen      Medical Nutrition Therapy Adults with   Diabetes  Renal disease  Kidney transplant within past 3 yrs 3 hours first year; 2 hours subsequent years     Obesity and Counseling All adults Screening once a year Counseling if BMI 30 or higher  Today   Tobacco Use Counseling Adults who use tobacco  Up to 8 visits in one year     Vaccines Z23  Hepatitis B  Influenza   Pneumonia  Adults   Once  Once every flu season  Two different vaccines separated by one year     Next Annual Wellness Visit People with Medicare Every year  Today     Services & Screenings Women Who How Often Need  Date of Last Service Action  Mammogram  Z12.31 Women over 93 One baseline ages 57-39. Annually ager 40 yrs+      Pap tests All women Annually if high risk. Every 2 yrs for normal risk women  Screening for cervical cancer with   Pap (Z01.419 nl or Z01.411abnl) &  HPV Z11.51 Women aged 56 to 72 Once every 5 yrs     Screening pelvic and breast exams All women Annually if high risk. Every 2 yrs for normal risk women     Sexually Transmitted Diseases  Chlamydia  Gonorrhea  Syphilis All at risk adults Annually for non pregnant females at increased risk         [place] Men Who How Ofter Need  Date of Last Service Action  Prostate Cancer - DRE & PSA Men over 50 Annually.  DRE might require a copay.        Sexually Transmitted Diseases  Syphilis All at risk adults Annually for men at increased risk      Health Maintenance List Health Maintenance  Topic Date Due  . Hepatitis C Screening  Never done  . COLONOSCOPY (Pts 45-49yrs Insurance coverage will need to be confirmed)  Never done  . MAMMOGRAM  Never done  . DEXA SCAN  Never done  . COVID-19 Vaccine (4 - Booster for  Moderna series) 02/05/2021  . INFLUENZA VACCINE  06/23/2021  . TETANUS/TDAP  09/27/2021  . PNA vac Low Risk Adult  Completed  . HPV VACCINES  Aged Out

## 2021-03-27 NOTE — Telephone Encounter (Signed)
Last appt was 07/15/20.

## 2021-03-27 NOTE — Progress Notes (Unsigned)

## 2021-04-14 ENCOUNTER — Ambulatory Visit (INDEPENDENT_AMBULATORY_CARE_PROVIDER_SITE_OTHER): Payer: Medicare PPO | Admitting: Student

## 2021-04-14 ENCOUNTER — Other Ambulatory Visit: Payer: Self-pay

## 2021-04-14 ENCOUNTER — Encounter: Payer: Self-pay | Admitting: Student

## 2021-04-14 DIAGNOSIS — Z0000 Encounter for general adult medical examination without abnormal findings: Secondary | ICD-10-CM

## 2021-04-14 NOTE — Progress Notes (Signed)
This AWV is being conducted by [facility] only. The patient was located at home and I was located in [facility]. The patient's identity was confirmed using their DOB and current address. The patient or his/her legal guardian has consented to being evaluated through a telephone encounter and understands the associated risks (an examination cannot be done and the patient may need to come in for an appointment) / benefits (allows the patient to remain at home, decreasing exposure to coronavirus). I personally spent 40 minutes conducting the AWV.  Subjective:   Haley Johnson is a 75 y.o. female who presents for a Medicare Annual Wellness Visit.  The following items have been reviewed and updated today in the appropriate area in the EMR.   Health Risk Assessment  Height, weight, BMI, and BP Visual acuity if needed Depression screen Fall risk / safety level Advance directive discussion Medical and family history were reviewed and updated Updating list of other providers & suppliers Medication reconciliation, including over the counter medicines Cognitive screen Written screening schedule Risk Factor list Personalized health advice, risky behaviors, and treatment advice  Social History   Social History Narrative   Current Social History 04/14/2021        Patient lives with her son a home which is 1 story. There are no steps up to the entrance.      Patient's method of transportation is personal car.      The highest level of education was high school diploma.      The patient currently retired.      Identified important Relationships are "with my sons"       Pets : None       Interests / Fun:  "Walking a little.  I use to sing in the choir and enjoyed that a lot".       Current Stressors: "When they found the cancer, but I am better now".             Objective:    Vitals: There were no vitals taken for this visit. Vitals are unable to obtained due to [id number]  public health emergency  Activities of Daily Living In your present state of health, do you have any difficulty performing the following activities: 04/14/2021 07/15/2020  Hearing? N N  Vision? N N  Comment bilateral cataract surgery -  Difficulty concentrating or making decisions? N N  Walking or climbing stairs? Y Y  Dressing or bathing? N Y  Doing errands, shopping? N Y  Comment recent CA treatment, family drives her now -    Goals Goals    .  Increase the strength in my legs (pt-stated)      Begin seated and standing exercises with exercise band 2-3 times/week for 10 minutes each time to increase strength and balance.        Fall Risk Fall Risk  04/14/2021 07/15/2020 06/19/2020 05/30/2020  Falls in the past year? 0 1 1 0  Number falls in past yr: - 0 0 -  Comment - - slid off couch -  Injury with Fall? - 0 0 -  Risk for fall due to : History of fall(s) Impaired mobility History of fall(s);Impaired balance/gait;Impaired mobility Impaired balance/gait;Impaired mobility  Follow up Falls evaluation completed - Falls evaluation completed Falls evaluation completed;Education provided    Depression Screen PHQ 2/9 Scores 04/14/2021 06/19/2020 05/30/2020  PHQ - 2 Score 0 1 0  PHQ- 9 Score 0 6 -     Cognitive  Testing Six-Item Cognitive Screener   "I would like to ask you some questions that ask you to use your memory. I am going to name three objects. Please wait until I say all three words, then repeat them. Remember what they are  because I am going to ask you to name them again in a few minutes. Please repeat these words for me: APPLE--TABLE--PENNY." (Interviewer may repeat names 3 times if necessary but repetition not scored.)  Did patient correctly repeat all three words? Yes - may proceed with screen  What year is this? Correct What month is this? Correct What day of the week is this? Correct  What were the three objects I asked you to remember? . Apple Correct . Table  Correct . Penny Correct  Score one point for each incorrect answer.  A score of 2 or more points warrants additional investigation.  Patient's score 0     Assessment and Plan:     The patient has a Mammogram scheduled on 04/18/21 at [facility].  She also states she is having her DEXA scan scheduled via [facility] and is waiting on a call from the [facility] with a date for this appointment.  The patient recently finished chemotherapy and states she will call [facility] back to make a follow up appointment with her PCP.  She patient states she would like to increase the strength in her legs.  She will begin seated and standing exercises with exercise band 2-3 times/week for 10 minutes/session to increase strength and balance.  CDC Handout on Fall Prevention and Handout on Home Exercise Program, Access codes [email or web address] and [email or web address] given/mailed to patient with exercise band.   During the course of the visit the patient was educated and counseled about appropriate screening and preventive services as documented in the assessment and plan.  The printed AVS was given to the patient and included an updated screening schedule, a list of risk factors, and personalized health advice.        Higinio Roger, RN  04/14/2021

## 2021-04-14 NOTE — Patient Instructions (Addendum)
Things That May Be Affecting Your Health:  Alcohol  Hearing loss  Pain    Depression  Home Safety  Sexual Health   Diabetes  Lack of physical activity  Stress  x Difficulty with daily activities  Loneliness  Tiredness   Drug use  Medicines  Tobacco use  x Falls  Motor Vehicle Safety  Weight  x Food choices  Oral Health  Other    YOUR PERSONALIZED HEALTH PLAN : 1. Schedule your next subsequent Medicare Wellness visit in one year 2. Attend all of your regular appointments to address your medical issues 3. Complete the preventative screenings and services 4. Begin seated and standing exercises with exercise band 2-3 times per week for 10 minutes/session to increase strength and balance.    Annual Wellness Visit                       Medicare Covered Preventative Screenings and Services  Services & Screenings Men and Women Who How Often Need? Date of Last Service Action  Abdominal Aortic Aneurysm Adults with AAA risk factors Once      Alcohol Misuse and Counseling All Adults Screening once a year if no alcohol misuse. Counseling up to 4 face to face sessions.     Bone Density Measurement  Adults at risk for osteoporosis Once every 2 yrs x     Lipid Panel Z13.6 All adults without CV disease Once every 5 yrs x      Colorectal Cancer   Stool sample or  Colonoscopy All adults 80 and older   Once every year  Every 10 years x       Depression All Adults Once a year  Today   Diabetes Screening Blood glucose, post glucose load, or GTT Z13.1  All adults at risk  Pre-diabetics  Once per year  Twice per year      Diabetes  Self-Management Training All adults Diabetics 10 hrs first year; 2 hours subsequent years. Requires Copay     Glaucoma  Diabetics  Family history of glaucoma  African Americans 76 yrs +  Hispanic Americans 98 yrs + Annually - requires coppay      Hepatitis C Z72.89 or F19.20  High Risk for  HCV  Born between 1945 and 1965  Annually  Once      HIV Z11.4 All adults based on risk  Annually btw ages 76 & 26 regardless of risk  Annually > 65 yrs if at increased risk      Lung Cancer Screening Asymptomatic adults aged 25-77 with 30 pack yr history and current smoker OR quit within the last 15 yrs Annually Must have counseling and shared decision making documentation before first screen      Medical Nutrition Therapy Adults with   Diabetes  Renal disease  Kidney transplant within past 3 yrs 3 hours first year; 2 hours subsequent years     Obesity and Counseling All adults Screening once a year Counseling if BMI 30 or higher  Today   Tobacco Use Counseling Adults who use tobacco  Up to 8 visits in one year     Vaccines Z23  Hepatitis B  Influenza   Pneumonia  Adults   Once  Once every flu season  Two different vaccines separated by one year     Next Annual Wellness Visit People with Medicare Every year  Today     Services & Screenings Women Who How Often Need  Date of Last  Service Action  Mammogram  Z12.31 Women over 38 One baseline ages 71-39. Annually ager 40 yrs+      Pap tests All women Annually if high risk. Every 2 yrs for normal risk women      Screening for cervical cancer with   Pap (Z01.419 nl or Z01.411abnl) &  HPV Z11.51 Women aged 54 to 76 Once every 5 yrs     Screening pelvic and breast exams All women Annually if high risk. Every 2 yrs for normal risk women     Sexually Transmitted Diseases  Chlamydia  Gonorrhea  Syphilis All at risk adults Annually for non pregnant females at increased risk         Fall Prevention in the Home, Adult Falls can cause injuries and can happen to people of all ages. There are many things you can do to make your home safe and to help prevent falls. Ask for help when making these changes. What actions can I take to prevent falls? General  Instructions  Use good lighting in all rooms. Replace any light bulbs that burn out.  Turn on the lights in dark areas. Use night-lights.  Keep items that you use often in easy-to-reach places. Lower the shelves around your home if needed.  Set up your furniture so you have a clear path. Avoid moving your furniture around.  Do not have throw rugs or other things on the floor that can make you trip.  Avoid walking on wet floors.  If any of your floors are uneven, fix them.  Add color or contrast paint or tape to clearly mark and help you see: ? Grab bars or handrails. ? First and last steps of staircases. ? Where the edge of each step is.  If you use a stepladder: ? Make sure that it is fully opened. Do not climb a closed stepladder. ? Make sure the sides of the stepladder are locked in place. ? Ask someone to hold the stepladder while you use it.  Know where your pets are when moving through your home. What can I do in the bathroom?  Keep the floor dry. Clean up any water on the floor right away.  Remove soap buildup in the tub or shower.  Use nonskid mats or decals on the floor of the tub or shower.  Attach bath mats securely with double-sided, nonslip rug tape.  If you need to sit down in the shower, use a plastic, nonslip stool.  Install grab bars by the toilet and in the tub and shower. Do not use towel bars as grab bars.      What can I do in the bedroom?  Make sure that you have a light by your bed that is easy to reach.  Do not use any sheets or blankets for your bed that hang to the floor.  Have a firm chair with side arms that you can use for support when you get dressed. What can I do in the kitchen?  Clean up any spills right away.  If you need to reach something above you, use a step stool with a grab bar.  Keep electrical cords out of the way.  Do not use floor polish or wax that makes floors slippery. What can I do with my stairs?  Do not  leave any items on the stairs.  Make sure that you have a light switch at the top and the bottom of the stairs.  Make sure that there are handrails  on both sides of the stairs. Fix handrails that are broken or loose.  Install nonslip stair treads on all your stairs.  Avoid having throw rugs at the top or bottom of the stairs.  Choose a carpet that does not hide the edge of the steps on the stairs.  Check carpeting to make sure that it is firmly attached to the stairs. Fix carpet that is loose or worn. What can I do on the outside of my home?  Use bright outdoor lighting.  Fix the edges of walkways and driveways and fix any cracks.  Remove anything that might make you trip as you walk through a door, such as a raised step or threshold.  Trim any bushes or trees on paths to your home.  Check to see if handrails are loose or broken and that both sides of all steps have handrails.  Install guardrails along the edges of any raised decks and porches.  Clear paths of anything that can make you trip, such as tools or rocks.  Have leaves, snow, or ice cleared regularly.  Use sand or salt on paths during winter.  Clean up any spills in your garage right away. This includes grease or oil spills. What other actions can I take?  Wear shoes that: ? Have a low heel. Do not wear high heels. ? Have rubber bottoms. ? Feel good on your feet and fit well. ? Are closed at the toe. Do not wear open-toe sandals.  Use tools that help you move around if needed. These include: ? Canes. ? Walkers. ? Scooters. ? Crutches.  Review your medicines with your doctor. Some medicines can make you feel dizzy. This can increase your chance of falling. Ask your doctor what else you can do to help prevent falls. Where to find more information  Centers for Disease Control and Prevention, [employer]AD[employer]: http://www.wolf.info/  National Institute on Aging: http://kim-miller.com/ Contact a doctor if:  You are afraid of falling  at home.  You feel weak, drowsy, or dizzy at home.  You fall at home. Summary  There are many simple things that you can do to make your home safe and to help prevent falls.  Ways to make your home safe include removing things that can make you trip and installing grab bars in the bathroom.  Ask for help when making these changes in your home. This information is not intended to replace advice given to you by your health care provider. Make sure you discuss any questions you have with your health care provider. Document Revised: 06/12/2020 Document Reviewed: 06/12/2020 Elsevier Patient Education  [place] Maintenance, Female Adopting a healthy lifestyle and getting preventive care are important in promoting health and wellness. Ask your health care provider about:  The right schedule for you to have regular tests and exams.  Things you can do on your own to prevent diseases and keep yourself healthy. What should I know about diet, weight, and exercise? Eat a healthy diet  Eat a diet that includes plenty of vegetables, fruits, low-fat dairy products, and lean protein.  Do not eat a lot of foods that are high in solid fats, added sugars, or sodium.   Maintain a healthy weight Body mass index (BMI) is used to identify weight problems. It estimates body fat based on height and weight. Your health care provider can help determine your BMI and help you achieve or maintain a healthy weight. Get regular exercise Get regular exercise. This  is one of the most important things you can do for your health. Most adults should:  Exercise for at least 150 minutes each week. The exercise should increase your heart rate and make you sweat (moderate-intensity exercise).  Do strengthening exercises at least twice a week. This is in addition to the moderate-intensity exercise.  Spend less time sitting. Even light physical activity can be beneficial. Watch cholesterol and blood  lipids Have your blood tested for lipids and cholesterol at 74 years of age, then have this test every 5 years. Have your cholesterol levels checked more often if:  Your lipid or cholesterol levels are high.  You are older than 74 years of age.  You are at high risk for heart disease. What should I know about cancer screening? Depending on your health history and family history, you may need to have cancer screening at various ages. This may include screening for:  Breast cancer.  Cervical cancer.  Colorectal cancer.  Skin cancer.  Lung cancer. What should I know about heart disease, diabetes, and high blood pressure? Blood pressure and heart disease  High blood pressure causes heart disease and increases the risk of stroke. This is more likely to develop in people who have high blood pressure readings, are of African descent, or are overweight.  Have your blood pressure checked: ? Every 3-5 years if you are 68-16 years of age. ? Every year if you are 33 years old or older. Diabetes Have regular diabetes screenings. This checks your fasting blood sugar level. Have the screening done:  Once every three years after age 63 if you are at a normal weight and have a low risk for diabetes.  More often and at a younger age if you are overweight or have a high risk for diabetes. What should I know about preventing infection? Hepatitis B If you have a higher risk for hepatitis B, you should be screened for this virus. Talk with your health care provider to find out if you are at risk for hepatitis B infection. Hepatitis C Testing is recommended for:  Everyone born from [birth year] through 1964.  Anyone with known risk factors for hepatitis C. Sexually transmitted infections (STIs)  Get screened for STIs, including gonorrhea and chlamydia, if: ? You are sexually active and are younger than 74 years of age. ? You are older than 74 years of age and your health care provider tells you that  you are at risk for this type of infection. ? Your sexual activity has changed since you were last screened, and you are at increased risk for chlamydia or gonorrhea. Ask your health care provider if you are at risk.  Ask your health care provider about whether you are at high risk for HIV. Your health care provider may recommend a prescription medicine to help prevent HIV infection. If you choose to take medicine to prevent HIV, you should first get tested for HIV. You should then be tested every 3 months for as long as you are taking the medicine. Pregnancy  If you are about to stop having your period (premenopausal) and you may become pregnant, seek counseling before you get pregnant.  Take 400 to 800 micrograms (mcg) of folic acid every day if you become pregnant.  Ask for birth control (contraception) if you want to prevent pregnancy. Osteoporosis and menopause Osteoporosis is a disease in which the bones lose minerals and strength with aging. This can result in bone fractures. If you are 4 years old  or older, or if you are at risk for osteoporosis and fractures, ask your health care provider if you should:  Be screened for bone loss.  Take a calcium or vitamin D supplement to lower your risk of fractures.  Be given hormone replacement therapy (HRT) to treat symptoms of menopause. Follow these instructions at home: Lifestyle  Do not use any products that contain nicotine or tobacco, such as cigarettes, e-cigarettes, and chewing tobacco. If you need help quitting, ask your health care provider.  Do not use street drugs.  Do not share needles.  Ask your health care provider for help if you need support or information about quitting drugs. Alcohol use  Do not drink alcohol if: ? Your health care provider tells you not to drink. ? You are pregnant, may be pregnant, or are planning to become pregnant.  If you drink alcohol: ? Limit how much you use to 0-1 drink a day. ? Limit  intake if you are breastfeeding.  Be aware of how much alcohol is in your drink. In the [place] one drink equals one 12 oz bottle of beer (355 mL), one 5 oz glass of wine (148 mL), or one 1 oz glass of hard liquor (44 mL). General instructions  Schedule regular health, dental, and eye exams.  Stay current with your vaccines.  Tell your health care provider if: ? You often feel depressed. ? You have ever been abused or do not feel safe at home. Summary  Adopting a healthy lifestyle and getting preventive care are important in promoting health and wellness.  Follow your health care provider's instructions about healthy diet, exercising, and getting tested or screened for diseases.  Follow your health care provider's instructions on monitoring your cholesterol and blood pressure. This information is not intended to replace advice given to you by your health care provider. Make sure you discuss any questions you have with your health care provider. Document Revised: 11/02/2018 Document Reviewed: 11/02/2018 Elsevier Patient Education  2021 [employer].

## 2021-04-19 ENCOUNTER — Encounter: Payer: Self-pay | Admitting: *Deleted

## 2021-04-22 NOTE — Progress Notes (Signed)
I discussed the AWV findings with the RN who conducted the visit. I was present in the office suite and immediately available to provide assistance and direction throughout the time the service was provided.   

## 2021-04-22 NOTE — Progress Notes (Signed)
[facility] Attending  Case discussed with Dr. Allyson Sabal at the time of the visit.  We reviewed the AWV findings.  I agree with the assessment, diagnosis, and plan of care documented in the AWV note.

## 2021-05-09 ENCOUNTER — Other Ambulatory Visit: Payer: Self-pay

## 2021-05-09 DIAGNOSIS — B0229 Other postherpetic nervous system involvement: Secondary | ICD-10-CM

## 2021-06-03 ENCOUNTER — Emergency Department (HOSPITAL_COMMUNITY)
Admission: EM | Admit: 2021-06-03 | Discharge: 2021-06-04 | Disposition: A | Discharge status: Other Acute Inpatient Hospital | Payer: Medicare PPO | Attending: Emergency Medicine | Admitting: Emergency Medicine

## 2021-06-03 ENCOUNTER — Encounter (HOSPITAL_COMMUNITY): Payer: Self-pay

## 2021-06-03 ENCOUNTER — Other Ambulatory Visit: Payer: Self-pay

## 2021-06-03 ENCOUNTER — Emergency Department (HOSPITAL_COMMUNITY): Payer: Medicare PPO

## 2021-06-03 DIAGNOSIS — K219 Gastro-esophageal reflux disease without esophagitis: Secondary | ICD-10-CM | POA: Insufficient documentation

## 2021-06-03 DIAGNOSIS — N938 Other specified abnormal uterine and vaginal bleeding: Secondary | ICD-10-CM | POA: Diagnosis not present

## 2021-06-03 DIAGNOSIS — Z859 Personal history of malignant neoplasm, unspecified: Secondary | ICD-10-CM | POA: Diagnosis not present

## 2021-06-03 DIAGNOSIS — K56609 Unspecified intestinal obstruction, unspecified as to partial versus complete obstruction: Secondary | ICD-10-CM | POA: Diagnosis not present

## 2021-06-03 DIAGNOSIS — J45909 Unspecified asthma, uncomplicated: Secondary | ICD-10-CM | POA: Diagnosis not present

## 2021-06-03 DIAGNOSIS — I10 Essential (primary) hypertension: Secondary | ICD-10-CM | POA: Insufficient documentation

## 2021-06-03 DIAGNOSIS — Z20822 Contact with and (suspected) exposure to covid-19: Secondary | ICD-10-CM | POA: Insufficient documentation

## 2021-06-03 DIAGNOSIS — R109 Unspecified abdominal pain: Secondary | ICD-10-CM | POA: Diagnosis present

## 2021-06-03 DIAGNOSIS — Z79899 Other long term (current) drug therapy: Secondary | ICD-10-CM | POA: Insufficient documentation

## 2021-06-03 LAB — CBC
HCT: 41.5 % (ref 36.0–46.0)
Hemoglobin: 12.9 g/dL (ref 12.0–15.0)
MCH: 32.3 pg (ref 26.0–34.0)
MCHC: 31.1 g/dL (ref 30.0–36.0)
MCV: 104 fL — ABNORMAL HIGH (ref 80.0–100.0)
Platelets: 201 10*3/uL (ref 150–400)
RBC: 3.99 MIL/uL (ref 3.87–5.11)
RDW: 14.9 % (ref 11.5–15.5)
WBC: 11 10*3/uL — ABNORMAL HIGH (ref 4.0–10.5)
nRBC: 0 % (ref 0.0–0.2)

## 2021-06-03 LAB — COMPREHENSIVE METABOLIC PANEL
ALT: 23 U/L (ref 0–44)
AST: 30 U/L (ref 15–41)
Albumin: 3.2 g/dL — ABNORMAL LOW (ref 3.5–5.0)
Alkaline Phosphatase: 50 U/L (ref 38–126)
Anion gap: 7 (ref 5–15)
BUN: 21 mg/dL (ref 8–23)
CO2: 29 mmol/L (ref 22–32)
Calcium: 9.5 mg/dL (ref 8.9–10.3)
Chloride: 100 mmol/L (ref 98–111)
Creatinine, Ser: 0.76 mg/dL (ref 0.44–1.00)
GFR, Estimated: >60 mL/min (ref >60)
Glucose, Bld: 146 mg/dL — ABNORMAL HIGH (ref 70–99)
Potassium: 4.4 mmol/L (ref 3.5–5.1)
Sodium: 136 mmol/L (ref 135–145)
Total Bilirubin: 0.5 mg/dL (ref 0.3–1.2)
Total Protein: 6.8 g/dL (ref 6.5–8.1)

## 2021-06-03 LAB — TYPE AND SCREEN
ABO/RH(D): O POS
Antibody Screen: NEGATIVE

## 2021-06-03 LAB — TROPONIN I (HIGH SENSITIVITY)
Troponin I (High Sensitivity): 12 ng/L (ref <18)
Troponin I (High Sensitivity): 10 ng/L (ref <18)

## 2021-06-03 LAB — LIPASE, BLOOD: Lipase: 32 U/L (ref 11–51)

## 2021-06-03 MED ORDER — ONDANSETRON HCL 4 MG/2ML IJ SOLN
4.0000 mg | Freq: Once | INTRAMUSCULAR | Status: AC
  Administered 2021-06-03: 4 mg via INTRAVENOUS
  Filled 2021-06-03: qty 2

## 2021-06-03 MED ORDER — IOHEXOL 300 MG/ML  SOLN
100.0000 mL | Freq: Once | INTRAMUSCULAR | Status: AC | PRN
  Administered 2021-06-03: 100 mL via INTRAVENOUS

## 2021-06-03 MED ORDER — LABETALOL HCL 5 MG/ML IV SOLN
10.0000 mg | Freq: Once | INTRAVENOUS | Status: AC
  Administered 2021-06-03: 10 mg via INTRAVENOUS
  Filled 2021-06-03: qty 4

## 2021-06-03 MED ORDER — MORPHINE SULFATE (PF) 4 MG/ML IV SOLN
4.0000 mg | Freq: Once | INTRAVENOUS | Status: AC
Start: 2021-06-03 — End: 2021-06-03
  Administered 2021-06-03: 4 mg via INTRAVENOUS
  Filled 2021-06-03: qty 1

## 2021-06-03 MED ORDER — SODIUM CHLORIDE 0.9 % IV BOLUS
500.0000 mL | Freq: Once | INTRAVENOUS | Status: AC
  Administered 2021-06-03: 500 mL via INTRAVENOUS

## 2021-06-03 MED ORDER — FENTANYL CITRATE (PF) 100 MCG/2ML IJ SOLN
50.0000 ug | Freq: Once | INTRAMUSCULAR | Status: AC
  Administered 2021-06-03: 50 ug via INTRAVENOUS
  Filled 2021-06-03: qty 2

## 2021-06-03 MED ORDER — PIPERACILLIN-TAZOBACTAM 3.375 G IVPB 30 MIN
3.3750 g | Freq: Once | INTRAVENOUS | Status: AC
  Administered 2021-06-03: 3.375 g via INTRAVENOUS
  Filled 2021-06-03: qty 50

## 2021-06-03 NOTE — ED Provider Notes (Signed)
[facility] EMERGENCY DEPARTMENT Provider Note   CSN: [id number] Arrival date & time: 06/03/21  1104     History Chief Complaint  Patient presents with   Abdominal Pain    Haley Johnson Risk is a 75 y.o. female.  Patient is a 75 year old female who presents with abdominal pain and vaginal swelling.  She had a recent total hysterectomy done at [facility] on June 26.  She had been doing well.  Today she started having some pain in her left abdomen.  She also noted some bleeding from her vaginal area.  She feels pressure in her vaginal area.  No fevers.  No nausea or vomiting.  No difficulty urinating.  She has had a little bit of loose stools but not much.      Past Medical History:  Diagnosis Date   Asthma    Cancer ([place])    GYN [place]   Dermatomyositis ([place]) 05/05/2020   Essential hypertension 05/05/2020   GERD (gastroesophageal reflux disease)    GERD without esophagitis 05/05/2020   Hypercholesteremia    Hypertension     Patient Active Problem List   Diagnosis Date Noted   Malnutrition of moderate degree 07/17/2020   Dysphagia    Gastroesophageal reflux disease    DDD (degenerative disc disease), lumbosacral    Abnormality of gait    Fall at home, initial encounter 07/15/2020   Fall (on)(from) sidewalk curb, subsequent encounter 07/15/2020   Skin breakdown 07/15/2020   Diarrhea 05/30/2020   Physical deconditioning 05/30/2020   Pressure injury of skin 05/08/2020   Postherpetic neuralgia 05/06/2020   Pelvic abscess in female 05/05/2020   Edema of all four extremities 05/05/2020   Lactic acidosis 05/05/2020   Essential hypertension 05/05/2020   GERD without esophagitis 05/05/2020   Dermatomyositis ([place]) 05/05/2020   Hypoalbuminemia due to protein-calorie malnutrition ([place]) 05/05/2020   Mixed hyperlipidemia 05/05/2020    History reviewed. No pertinent surgical history.   OB History   No obstetric history on file.     Family History  Problem  Relation Age of Onset   Breast cancer Mother    Cancer Daughter     Social History   Tobacco Use   Smoking status: Never   Smokeless tobacco: Never  Substance Use Topics   Alcohol use: Never   Drug use: Never    Home Medications Prior to Admission medications   Medication Sig Start Date End Date Taking? Authorizing Provider  albuterol (VENTOLIN HFA) 108 (90 Base) MCG/ACT inhaler Inhale 1-2 puffs into the lungs every 6 (six) hours as needed for wheezing or shortness of breath.    [provider]  amLODipine (NORVASC) 5 MG tablet Take 5 mg by mouth daily. 03/19/20   [provider]  bisoprolol-hydrochlorothiazide (ZIAC) 2.5-6.25 MG tablet Take 1 tablet by mouth daily. 03/19/20   [provider]  DULERA 200-5 MCG/ACT AERO Inhale 2 puffs into the lungs daily as needed for shortness of breath or wheezing. 05/23/20   [provider]  etodolac (LODINE) 400 MG tablet Take 400 mg by mouth 2 (two) times daily. 04/17/20   [provider]  famotidine (PEPCID) 40 MG tablet Take 40 mg by mouth daily. 03/19/20   [provider]  Multiple Vitamin (MULTIVITAMIN) tablet Take 1 tablet by mouth daily.    [provider]  mycophenolate (CELLCEPT) 500 MG tablet Take 1,000 mg by mouth 2 (two) times daily.    [provider]  omeprazole (PRILOSEC) 40 MG capsule TAKE 1  CAPSULE(40 MG) BY MOUTH DAILY 08/05/20   Mitzi Hansen, MD  predniSONE (DELTASONE) 10 MG tablet TAKE 1 TABLET(10 MG) BY MOUTH DAILY 12/30/20   Madalyn Rob, MD  pregabalin (LYRICA) 100 MG capsule TAKE 1 CAPSULE(100 MG) BY MOUTH THREE TIMES DAILY 03/27/21   Madalyn Rob, MD  rosuvastatin (CRESTOR) 40 MG tablet Take 40 mg by mouth daily. 03/19/20   [provider]  Vitamins A & D (VITAMIN A & D) ointment Apply 1 application topically as needed for dry skin. Patient not taking: No sig reported 07/15/20   Andrew Au, MD  Zinc Oxide (DESITIN MAXIMUM STRENGTH EX) Apply 1  application topically daily as needed (rash prevention). Patient not taking: Reported on 04/14/2021    [provider]    Allergies    Chocolate, Blueberry flavor, and Tomato  Review of Systems   Review of Systems  Constitutional:  Negative for chills, diaphoresis, fatigue and fever.  HENT:  Negative for congestion, rhinorrhea and sneezing.   Eyes: Negative.   Respiratory:  Negative for cough, chest tightness and shortness of breath.   Cardiovascular:  Negative for chest pain and leg swelling.  Gastrointestinal:  Positive for abdominal pain. Negative for blood in stool, diarrhea, nausea and vomiting.  Genitourinary:  Positive for vaginal bleeding and vaginal pain. Negative for difficulty urinating, flank pain, frequency and hematuria.  Musculoskeletal:  Negative for arthralgias and back pain.  Skin:  Negative for rash.  Neurological:  Negative for dizziness, speech difficulty, weakness, numbness and headaches.   Physical Exam Updated Vital Signs BP (!) 216/92   Pulse 78   Temp 98.7 F (37.1 C) (Oral)   Resp (!) 25   SpO2 98%   Physical Exam Constitutional:      Appearance: She is well-developed.  HENT:     Head: Normocephalic and atraumatic.  Eyes:     Pupils: Pupils are equal, round, and reactive to light.  Cardiovascular:     Rate and Rhythm: Normal rate and regular rhythm.     Heart sounds: Normal heart sounds.  Pulmonary:     Effort: Pulmonary effort is normal. No respiratory distress.     Breath sounds: Normal breath sounds. No wheezing or rales.  Chest:     Chest wall: No tenderness.  Abdominal:     General: Bowel sounds are normal.     Palpations: Abdomen is soft.     Tenderness: There is abdominal tenderness (Tenderness to the left abdomen, she has healing incisions from the prior surgery, no signs of infection). There is no guarding or rebound.  Genitourinary:    Comments: She has large swollen tissue at her vaginal introitus.  It appears that her  labia minora or markedly swollen and protruding from the introitus. Musculoskeletal:        General: Normal range of motion.     Cervical back: Normal range of motion and neck supple.  Lymphadenopathy:     Cervical: No cervical adenopathy.  Skin:    General: Skin is warm and dry.     Findings: No rash.  Neurological:     Mental Status: She is alert and oriented to person, place, and time.    ED Results / Procedures / Treatments   Labs (all labs ordered are listed, but only abnormal results are displayed) Labs Reviewed  COMPREHENSIVE METABOLIC PANEL - Abnormal; Notable for the following components:      Result Value   Glucose, Bld 146 (*)    Albumin 3.2 (*)  All other components within normal limits  CBC - Abnormal; Notable for the following components:   WBC 11.0 (*)    MCV 104.0 (*)    All other components within normal limits  RESP PANEL BY RT-PCR (FLU A&B, COVID) ARPGX2  LIPASE, BLOOD  POC OCCULT BLOOD, ED  TYPE AND SCREEN  TROPONIN I (HIGH SENSITIVITY)  TROPONIN I (HIGH SENSITIVITY)    EKG EKG Interpretation  Date/Time:  Tuesday June 03 2021 17:36:22 EDT Ventricular Rate:  78 PR Interval:  178 QRS Duration: 76 QT Interval:  372 QTC Calculation: 424 R Axis:   -12 Text Interpretation: Sinus rhythm Consider left ventricular hypertrophy Minimal ST elevation, inferior leads similar to prior EKGs Confirmed by Malvin Johns [phone]) on 06/03/2021 5:58:54 PM  Radiology CT Abdomen Pelvis W Contrast  Result Date: 06/03/2021 CLINICAL DATA:  Nonlocalized abdominal pain x2 days post hysterectomy. EXAM: CT ABDOMEN AND PELVIS WITH CONTRAST TECHNIQUE: Multidetector CT imaging of the abdomen and pelvis was performed using the standard protocol following bolus administration of intravenous contrast. CONTRAST:  171mL OMNIPAQUE IOHEXOL 300 MG/ML  SOLN COMPARISON:  CT May 05, 2020 FINDINGS: Lower chest: No acute abnormality.  Small hiatal hernia. Hepatobiliary: Subcentimeter  hypodense hepatic lesion in the right lobe of the liver too small to accurately characterize but unchanged since prior CT May 03, 2020 and favored represent a benign etiology such as cyst or hemangioma. Gallbladder is unremarkable. No biliary ductal dilation. Pancreas: Within normal limits. Spleen: Within normal limits. Adrenals/Urinary Tract: Bilateral adrenal glands are unremarkable. No hydronephrosis. Bilateral hypodense renal lesions technically too small to accurately characterize but statistically likely represent cysts. No solid enhancing renal lesions. Urinary bladder is grossly unremarkable for degree of distension. Stomach/Bowel: Small hiatal hernia stomach is otherwise grossly unremarkable. Dilated loops of small bowel throughout the abdomen measuring up to 3 mm with transition to decompressed bowel at the ostia of a fat and bowel containing pelvic floor hernia on images 71-77/6. The hernia sac is only partially visualized however within the visualized portions there is extraluminal gas with submucosal wall thickening and mild hypoenhancement of the mucosa. Distal ileum extends out of the other side of the hernia sac and is normal caliber with internal fluid present. Gas and stool visualized in the ascending and transverse colon with relative decompressed descending and sigmoid colon. Vascular/Lymphatic: Aortic atherosclerosis without aneurysmal dilation. No pathologically enlarged abdominal or pelvic lymph nodes. Reproductive: Surgical changes of recent hysterectomy. No suspicious adnexal lesions. Other: Ventral abdominal wall incision.  Trace pelvic free fluid. Musculoskeletal: Multilevel degenerative changes spine. No acute osseous abnormality. IMPRESSION: Small-bowel obstruction with transition to decompressed bowel at the ostia of a fat and bowel containing pelvic floor hernia. The hernia sac is only partially visualized, however within the visualized portions there is extraluminal gas with  submucosal wall thickening and mild hypoenhancement of the mucosa. Findings are concerning for small bowel obstruction with closed loop morphology. Surgical consultation is recommended. Aortic Atherosclerosis (ICD10-I70.0). These results were called by telephone at the time of interpretation on 06/03/2021 at 8:57 pm to provider Parish Augustine , who verbally acknowledged these results. Electronically Signed   By: Dahlia Bailiff MD   On: 06/03/2021 20:58    Procedures Procedures   Medications Ordered in ED Medications  morphine 4 MG/ML injection 4 mg (has no administration in time range)  labetalol (NORMODYNE) injection 10 mg (has no administration in time range)  iohexol (OMNIPAQUE) 300 MG/ML solution 100 mL (100 mLs Intravenous Contrast Given 06/03/21 1928)  fentaNYL (SUBLIMAZE) injection 50 mcg (50 mcg Intravenous Given 06/03/21 2100)  sodium chloride 0.9 % bolus 500 mL (500 mLs Intravenous New Bag/Given 06/03/21 2059)  ondansetron (ZOFRAN) injection 4 mg (4 mg Intravenous Given 06/03/21 2059)  piperacillin-tazobactam (ZOSYN) IVPB 3.375 g (0 g Intravenous Stopped 06/03/21 2211)    ED Course  I have reviewed the triage vital signs and the nursing notes.  Pertinent labs & imaging results that were available during my care of the patient were reviewed by me and considered in my medical decision making (see chart for details).    MDM Rules/Calculators/A&P                          Patient is a 75 year old female who presents with abdominal pain and protrusion from her vagina that started today.  She had recent surgery on June 22.  Her labs show mild elevation in her WBC count.  Her CT scan shows a small bowel obstruction related to a pelvic floor hernia.  There is some concern for a small area of perforation with extraluminal gas.  I spoke with our general surgeon here, Dr. Grandville Silos who reviewed the scans and also spoke to the [facility] op surgeon at [facility] who did the surgery.  Dr. Grandville Silos and and  Dr.Berchuck have consulted together and opted to transfer the patient to [facility] to the emergency department.  She was given a dose of Zosyn.  She was given pain control.  NG tube was placed.  Her blood pressure continued to climb.  She was given a dose of labetalol.  I have contacted our radiology department who is going to push the images over to [facility] of the CT scans.  CRITICAL CARE Performed by: Malvin Johns Total critical care time: 70 minutes Critical care time was exclusive of separately billable procedures and treating other patients. Critical care was necessary to treat or prevent imminent or life-threatening deterioration. Critical care was time spent personally by me on the following activities: development of treatment plan with patient and/or surrogate as well as nursing, discussions with consultants, evaluation of patient's response to treatment, examination of patient, obtaining history from patient or surrogate, ordering and performing treatments and interventions, ordering and review of laboratory studies, ordering and review of radiographic studies, pulse oximetry and re-evaluation of patient's condition.  Final Clinical Impression(s) / ED Diagnoses Final diagnoses:  SBO (small bowel obstruction) ([place])    Rx / DC Orders ED Discharge Orders     None        Malvin Johns, MD 06/03/21 2241

## 2021-06-03 NOTE — ED Notes (Signed)
Pt reported 10/10 abdominal pain and nausea. MD made aware.

## 2021-06-03 NOTE — ED Notes (Signed)
Paged general surgery

## 2021-06-03 NOTE — ED Notes (Signed)
Called [facility] to do ED TO ED transfer to [facility]

## 2021-06-03 NOTE — ED Provider Notes (Signed)
Emergency Medicine Provider Triage Evaluation Note  Haley Johnson , a 75 y.o. female  was evaluated in triage.  Pt complains of patient presents with lower abdominal pain, started this morning around 9 AM, pain does not radiate, she states originally started with her left breast and radiate down to her abdomen, she recently had a hysterectomy 2 weeks ago, she does not endorse nausea, vomiting, diarrhea, denies fevers, chills, has no cardiac history.  States the pain is worse when she moves..  Review of Systems  Positive: Abdominal pain, leg swelling Negative: Denies chest pain or shortness of breath  Physical Exam  BP (!) 179/78 (BP Location: Left Arm)   Pulse 73   Temp 98.3 F (36.8 C) (Oral)   Resp 16   SpO2 100%  Gen:   Awake, no distress   Resp:  Normal effort  MSK:   Moves extremities without difficulty  Other:  Abdomen soft nontender to palpation, no rebound tenderness or peritoneal sign.  Medical Decision Making  Medically screening exam initiated at 12:33 PM.  Appropriate orders placed.  Haley Johnson was informed that the remainder of the evaluation will be completed by another provider, this initial triage assessment does not replace that evaluation, and the importance of remaining in the ED until their evaluation is complete.  Patient presents with abdominal pain, lab work has been ordered, patient will need further work-up in the emergency department.   Marcello Fennel, PA-C 06/03/21 1234    Carmin Muskrat, MD 06/05/21 1106

## 2021-06-03 NOTE — ED Notes (Signed)
Pt unable to complete orthostatic vitals due to N/V and weakness.

## 2021-06-03 NOTE — ED Notes (Signed)
[facility] life flight will arrive in 1 hour 30 mins

## 2021-06-03 NOTE — ED Notes (Signed)
Pt has a large amount of tissue bulging out of vagina.

## 2021-06-03 NOTE — ED Notes (Signed)
CT made this nurse aware that pt has vomited while in CT room. Pt cleaned up by RN and tech. New linens

## 2021-06-03 NOTE — ED Notes (Signed)
[facility] transport at ~1am

## 2021-06-03 NOTE — ED Triage Notes (Signed)
Patient complains of generalized abdominal cramping and reported rectal bleeding this am. Patient had hysterectomy 2 weeks ago. Patient alert and oriented, NAD

## 2021-06-04 ENCOUNTER — Emergency Department (HOSPITAL_COMMUNITY): Payer: Medicare PPO

## 2021-06-04 DIAGNOSIS — K56609 Unspecified intestinal obstruction, unspecified as to partial versus complete obstruction: Secondary | ICD-10-CM | POA: Diagnosis not present

## 2021-06-04 DIAGNOSIS — Z7952 Long term (current) use of systemic steroids: Secondary | ICD-10-CM | POA: Insufficient documentation

## 2021-06-04 LAB — RESP PANEL BY RT-PCR (FLU A&B, COVID) ARPGX2
Influenza A by PCR: NEGATIVE
Influenza B by PCR: NEGATIVE
SARS Coronavirus 2 by RT PCR: NEGATIVE

## 2021-06-04 MED ORDER — HYDROMORPHONE HCL 1 MG/ML IJ SOLN
0.5000 mg | Freq: Once | INTRAMUSCULAR | Status: AC
  Administered 2021-06-04: 0.5 mg via INTRAVENOUS
  Filled 2021-06-04: qty 1

## 2021-06-04 NOTE — ED Notes (Signed)
Provider notified of hypertension

## 2021-08-05 ENCOUNTER — Other Ambulatory Visit: Payer: Self-pay | Admitting: *Deleted

## 2021-08-05 DIAGNOSIS — B0229 Other postherpetic nervous system involvement: Secondary | ICD-10-CM

## 2021-08-06 MED ORDER — PREGABALIN 100 MG PO CAPS: ORAL_CAPSULE | ORAL | 0 refills | Status: DC

## 2021-08-06 NOTE — Telephone Encounter (Signed)
Can we make a follow up appointment for her she has not been seen since 07/15/2020. Thanks

## 2021-08-06 NOTE — Telephone Encounter (Signed)
Pt called to schedule an appt. Stated she will - call transferred to front office.

## 2021-08-13 ENCOUNTER — Ambulatory Visit (INDEPENDENT_AMBULATORY_CARE_PROVIDER_SITE_OTHER): Payer: Medicare PPO | Admitting: Internal Medicine

## 2021-08-13 ENCOUNTER — Encounter: Payer: Self-pay | Admitting: Internal Medicine

## 2021-08-13 VITALS — BP 128/70 | HR 82 | Temp 98.4°F | Wt 157.3 lb

## 2021-08-13 DIAGNOSIS — B0229 Other postherpetic nervous system involvement: Secondary | ICD-10-CM

## 2021-08-13 DIAGNOSIS — E785 Hyperlipidemia, unspecified: Secondary | ICD-10-CM | POA: Diagnosis not present

## 2021-08-13 DIAGNOSIS — M3390 Dermatopolymyositis, unspecified, organ involvement unspecified: Secondary | ICD-10-CM

## 2021-08-13 DIAGNOSIS — E782 Mixed hyperlipidemia: Secondary | ICD-10-CM

## 2021-08-13 DIAGNOSIS — Z0000 Encounter for general adult medical examination without abnormal findings: Secondary | ICD-10-CM | POA: Diagnosis not present

## 2021-08-13 DIAGNOSIS — I10 Essential (primary) hypertension: Secondary | ICD-10-CM | POA: Diagnosis not present

## 2021-08-13 MED ORDER — TRIAMCINOLONE ACETONIDE 0.5 % EX OINT: 1.0000 "application " | TOPICAL_OINTMENT | Freq: Two times a day (BID) | CUTANEOUS | 0 refills | Status: DC

## 2021-08-13 MED ORDER — FOLIC ACID 1 MG PO TABS: 1.0000 mg | ORAL_TABLET | Freq: Every day | ORAL | 1 refills | Status: DC

## 2021-08-13 MED ORDER — PREGABALIN 100 MG PO CAPS: ORAL_CAPSULE | ORAL | 1 refills | Status: DC

## 2021-08-13 MED ORDER — AMLODIPINE BESYLATE 5 MG PO TABS: 5.0000 mg | ORAL_TABLET | Freq: Every day | ORAL | 1 refills | Status: DC

## 2021-08-13 NOTE — Assessment & Plan Note (Signed)
Patient takes amlodipine 5mg  daily for her BP. She was previously on bisoprolol-hctz 2.5-6.25mg , however, she states that this was discontinued by one of her providers at [facility]. BP today is within normal limits and thus, no adjustments need to be made to her meds.  BP Readings from Last 3 Encounters:  08/13/21 128/70  06/04/21 (!) 199/103  07/21/20 (!) 156/65    Plan: - Continue amldoipine 5mg  daily

## 2021-08-13 NOTE — Assessment & Plan Note (Signed)
Patient states that her DEXA scan and mammograms are up-to-date and were done through the [facility] system.  Declined flu shot today.

## 2021-08-13 NOTE — Assessment & Plan Note (Signed)
Patient follows with a provider through [facility] for her dermatomyositis.  She states that she has been taking prednisone 10 mg every day, as well as hydroxychloroquine 200 mg daily, and a folic acid supplement.  Advised patient to continue to follow-up with her rheumatologist as scheduled.

## 2021-08-13 NOTE — Assessment & Plan Note (Signed)
Patient was previously taking Crestor for her cholesterol, however she states that this was discontinued by one of her providers at [facility]  Unsure of the reason for this medicine discontinued upon chart review this is still unclear.  We will check a lipid panel today and made the patient aware that if her cholesterol is elevated we may need to restart her Crestor.  Plan: - Lipid panel today

## 2021-08-13 NOTE — Patient Instructions (Signed)
Thank you, [name] for allowing [place] to provide your care today. Today we discussed: Blood pressure: Continue taking amlodipine 5mg  everyday Cholesterol: Checking your cholesterol today and if it is high, I will call you to restart your Crestor Healthcare maintenance: You can get your flu shot at your local pharmacy!  I have ordered the following labs for you:   Lab Orders         Lipid Profile       Referrals ordered today:   Referral Orders  No referral(s) requested today     I have ordered the following medication/changed the following medications:   Stop the following medications: Medications Discontinued During This Encounter  Medication Reason   famotidine (PEPCID) 40 MG tablet Completed Course   omeprazole (PRILOSEC) 40 MG capsule Discontinued by provider   Vitamins A & D (VITAMIN A & D) ointment    amLODipine (NORVASC) 5 MG tablet Reorder   pregabalin (LYRICA) 100 MG capsule Reorder     Start the following medications: Meds ordered this encounter  Medications   triamcinolone ointment (KENALOG) 0.5 %    Sig: Apply 1 application topically 2 (two) times daily.    Dispense:  30 g    Refill:  0   amLODipine (NORVASC) 5 MG tablet    Sig: Take 1 tablet (5 mg total) by mouth daily.    Dispense:  90 tablet    Refill:  1   folic acid (FOLVITE) 1 MG tablet    Sig: Take 1 tablet (1 mg total) by mouth daily.    Dispense:  90 tablet    Refill:  1   pregabalin (LYRICA) 100 MG capsule    Sig: TAKE 1 CAPSULE(100 MG) BY MOUTH THREE TIMES DAILY    Dispense:  90 capsule    Refill:  1     Follow up: 6 months     Should you have any questions or concerns please call the internal medicine clinic at [phone].     Buddy Duty, D.O. [place]

## 2021-08-13 NOTE — Progress Notes (Signed)
   CC: annual visit   HPI:  [name] is a 75 y.o. female with HTN, GERD, DDD of the lumbar spine, and HLD who presents to the [facility] for an annual visit.   Past Medical History:  Diagnosis Date   Asthma    Cancer ([place])    GYN [place]   Dermatomyositis ([place]) 05/05/2020   Essential hypertension 05/05/2020   GERD (gastroesophageal reflux disease)    GERD without esophagitis 05/05/2020   Hypercholesteremia    Hypertension    Review of Systems:  Review of Systems  Constitutional:  Negative for chills and fever.  HENT:  Negative for congestion and sore throat.   Eyes:  Negative for blurred vision and double vision.  Respiratory:  Negative for cough and shortness of breath.   Cardiovascular:  Negative for chest pain and palpitations.  Gastrointestinal:  Negative for diarrhea, nausea and vomiting.  Genitourinary: Negative.   Musculoskeletal: Negative.   Neurological:  Negative for dizziness and headaches.    Physical Exam:  Vitals:   08/13/21 1322  BP: 128/70  Pulse: 82  Temp: 98.4 F (36.9 C)  TempSrc: Oral  SpO2: 100%  Weight: 157 lb 4.8 oz (71.4 kg)   General:  No acute distress. CV: RRR. No murmurs, rubs, or gallops. No LE edema Pulmonary: Lungs CTAB. Normal effort. No wheezing or rales. Abdominal: Soft, nontender, nondistended. Normal bowel sounds. Extremities: Palpable radial and DP pulses. Normal ROM. Skin: Warm and dry. No obvious rash or lesions. Neuro: A&Ox3. Moves all extremities. Normal sensation. No focal deficit. Psych: Normal mood and affect   Assessment & Plan:   See Encounters Tab for problem based charting.  Patient seen with Dr. [facility]

## 2021-08-14 LAB — LIPID PANEL
Chol/HDL Ratio: 4.1 ratio (ref 0.0–4.4)
Cholesterol, Total: 156 mg/dL (ref 100–199)
HDL: 38 mg/dL — ABNORMAL LOW (ref >39)
LDL Chol Calc (NIH): 91 mg/dL (ref 0–99)
Triglycerides: 155 mg/dL — ABNORMAL HIGH (ref 0–149)
VLDL Cholesterol Cal: 27 mg/dL (ref 5–40)

## 2021-08-15 NOTE — Progress Notes (Signed)
Internal Medicine Clinic Attending ° °I saw and evaluated the patient.  I personally confirmed the key portions of the history and exam documented by Dr. Atway and I reviewed pertinent patient test results.  The assessment, diagnosis, and plan were formulated together and I agree with the documentation in the resident’s note.  °

## 2021-08-29 ENCOUNTER — Telehealth: Payer: Self-pay

## 2021-08-29 NOTE — Telephone Encounter (Signed)
Pt son is requesting a call back he stated that the edema that his mom had in her legs  has gotten worse since her visit and is wanting to know if the Dr can send her something in for it

## 2021-08-29 NOTE — Telephone Encounter (Signed)
Call placed to patient's son to relay info below from Dr. Marva Panda, and to schedule appt for next week. No answer. Left message on VM requesting return call.

## 2021-09-05 ENCOUNTER — Ambulatory Visit (HOSPITAL_COMMUNITY)
Admission: RE | Admit: 2021-09-05 | Discharge: 2021-09-05 | Disposition: A | Discharge status: Home / Self Care | Payer: Medicare PPO | Source: Ambulatory Visit | Attending: Student in an Organized Health Care Education/Training Program | Admitting: Student in an Organized Health Care Education/Training Program

## 2021-09-05 ENCOUNTER — Ambulatory Visit (INDEPENDENT_AMBULATORY_CARE_PROVIDER_SITE_OTHER): Payer: Medicare PPO | Admitting: Student

## 2021-09-05 ENCOUNTER — Other Ambulatory Visit: Payer: Self-pay

## 2021-09-05 ENCOUNTER — Encounter: Payer: Self-pay | Admitting: Student

## 2021-09-05 VITALS — BP 160/69 | HR 70 | Temp 97.8°F | Ht 62.0 in | Wt 165.1 lb

## 2021-09-05 DIAGNOSIS — M5137 Other intervertebral disc degeneration, lumbosacral region: Secondary | ICD-10-CM

## 2021-09-05 DIAGNOSIS — M5136 Other intervertebral disc degeneration, lumbar region: Secondary | ICD-10-CM | POA: Diagnosis not present

## 2021-09-05 DIAGNOSIS — Z23 Encounter for immunization: Secondary | ICD-10-CM

## 2021-09-05 DIAGNOSIS — R600 Localized edema: Secondary | ICD-10-CM

## 2021-09-05 DIAGNOSIS — M4807 Spinal stenosis, lumbosacral region: Secondary | ICD-10-CM | POA: Insufficient documentation

## 2021-09-05 DIAGNOSIS — I10 Essential (primary) hypertension: Secondary | ICD-10-CM

## 2021-09-05 DIAGNOSIS — M3390 Dermatopolymyositis, unspecified, organ involvement unspecified: Secondary | ICD-10-CM

## 2021-09-05 MED ORDER — PREGABALIN 150 MG PO CAPS: 150.0000 mg | ORAL_CAPSULE | Freq: Three times a day (TID) | ORAL | 2 refills | Status: DC

## 2021-09-05 NOTE — Telephone Encounter (Signed)
Saw her this morning.

## 2021-09-05 NOTE — Telephone Encounter (Signed)
Son called back.stated he never received message. Note from Dr. Marva Panda below read to him. Appt scheduled for today with [facility] as [facility] has no openings.

## 2021-09-05 NOTE — Assessment & Plan Note (Signed)
BP elevated to systolic in the 837R due to patient's back pain.  BP was normal with systolic in the 939S on current regimen during last visit. -- Continue amlodipine 5 mg daily and reassess at next office visit.

## 2021-09-05 NOTE — Patient Instructions (Signed)
Thank you, [name] for allowing [place] to provide your care today. Today we discussed your back pain and leg swelling.  We will plan on getting another imaging of the back and referring you to orthopedic surgery for further evaluation and management of your back pain.  Have increased your Lyrica dose.  Continue taking Tylenol as needed for pain.  I have place a referrals to orthopedic surgeon  I have ordered the following tests: MRI lumbar spine  I have ordered the following medication/changed the following medications:  Increase your Lyrica to 150 mg 3 times daily   My Chart Access: [email or web address]  Please follow-up in 2 weeks.   Please make sure to arrive 15 minutes prior to your next appointment. If you arrive late, you may be asked to reschedule.    We look forward to seeing you next time. Please call our clinic at [phone] if you have any questions or concerns. The best time to call is Monday-Friday from 9am-4pm, but there is someone available 24/7. If after hours or the weekend, call the main hospital number and ask for the Internal Medicine Resident On-Call. If you need medication refills, please notify your pharmacy one week in advance and they will send [place] a request.   Thank you for letting us take part in your care. Wishing you the best!  Lacinda Axon, MD 09/05/2021, 12:03 PM IM Resident, PGY-2 [facility]ian 41:10

## 2021-09-05 NOTE — Assessment & Plan Note (Addendum)
Patient with history of degenerative disc disease and severe canal stenosis of the L-spine level presents to clinic for evaluation of worsening back pain for the last 1 to 2 weeks. Patient describes the pain as aching and stretching. She reports occasional shooting pain down her legs.  She endorsed some weakness in her legs, causing a limping gait but denies any trauma, falls, bowel incontinence, urinary incontinence or fever. She has been managing the pain with ibuprofen and Tylenol without significant relief.  MRI of the L-spine in 2021 showed severe degenerative changes and central canal stenosis, worse at the L-spine level.  Patient has not been assessed by a spine specialist.  On exam, patient tender to palpation of the L-spine with negative straight leg raise.  Patient is at risk of osteoporosis and avascular necrosis in the setting of long-term steroid use for her dermatomyositis.  Recent DEXA scan showed patient is currently osteopenic.   Plan: -- Increased Lyrica from 100 to 150 mg 3 times daily -- OTC Tylenol as needed for pain -- X-ray of the L-spine -- MRI L-spine -- Orthopedic referral -- 2-week follow-up for reevaluation  Addendum -- X-ray of the L-spine showed multilevel degenerative change without acute abnormality. Unable to reach patient to discuss results.  Patient has an appointment with orthopedic surgery in November.  -- MRI L-spine still pending -- Clinic appointment on 10/21 -- Appointment with orthopedic surgery on 11/11

## 2021-09-05 NOTE — Assessment & Plan Note (Addendum)
Patient states she continues to have lower extremity edema that has progressed since the decreased dose of her steroids from 10 to 7.5 mg. She denies any shortness of breath, dyspnea on exertion, chest pain palpitations or dizziness.  States she plans with her rheumatologist as she was doing better on the 10 and 20 mg of prednisone and would like to go up on her dose if possible. -- Advised to follow-up with rheumatologist at [facility]  Addendum: Multiple unsuccessful attempts have been made to reach patient to discuss possible medication changes to help with lower extremity swelling. -- Patient with appointment with Dr. Marva Panda on 10/21 -- Consider stopping amlodipine and starting patient on HCTZ to address lower extremity swelling problem.

## 2021-09-05 NOTE — Assessment & Plan Note (Signed)
Stable. Follows with a rheumatologist at [facility]. -- Patient will follow-up with rheumatologist.

## 2021-09-05 NOTE — Progress Notes (Addendum)
   CC: Back pain/leg swelling  HPI:  [name] is a 75 y.o. female with PMH as below who presents to the office today for evaluation of worsening low back pain. Please see problem based charting for evaluation, assessment and plan.  Past Medical History:  Diagnosis Date   Asthma    Cancer ([place])    GYN [place]   Dermatomyositis ([place]) 05/05/2020   Essential hypertension 05/05/2020   GERD (gastroesophageal reflux disease)    GERD without esophagitis 05/05/2020   Hypercholesteremia    Hypertension     Review of Systems:  Constitutional: Negative for fever or fatigue Eyes: Negative for visual changes Respiratory: Negative for shortness of breath Cardiac: Negative for chest pain MSK: Positive for back pain Extremities: Positive for leg swelling GU: Negative for bowel or urinary incontinence Neuro: Positive for weakness.  Negative for headache or dizziness  Physical Exam: General: Pleasant, well-appearing elderly female. No acute distress. Cardiac: RRR. No murmurs, rubs or gallops. Trace-1+ BLE edema Respiratory: Lungs CTAB. No wheezing or crackles. Abdominal: Soft, symmetric and non tender. Normal BS. Skin: Warm, dry and intact without rashes or lesions MSK: No tenderness to palpation of the C-spine and T-spine. Mild tenderness to palpation of the L-spine. No paraspinal tenderness.  Negative straight leg test. Extremities: Atraumatic. Full ROM. Palpable radial and DP pulses. Neuro: A&O x 3. Moves all extremities. Strength 5/5 in all extremities. Normal sensation. Slight limping gait. Slow transitions. Psych: Appropriate mood and affect.   Vitals:   09/05/21 1110  BP: (!) 160/69  Pulse: 70  Temp: 97.8 F (36.6 C)  TempSrc: Oral  SpO2: 100%  Weight: 165 lb 1.6 oz (74.9 kg)  Height: 5\' 2"  (1.575 m)    Assessment & Plan:   See Encounters Tab for problem based charting.  Patient discussed with Dr.  Thomes Cake, MD, MPH

## 2021-09-12 ENCOUNTER — Ambulatory Visit (INDEPENDENT_AMBULATORY_CARE_PROVIDER_SITE_OTHER): Payer: Medicare PPO | Admitting: Internal Medicine

## 2021-09-12 ENCOUNTER — Other Ambulatory Visit: Payer: Self-pay

## 2021-09-12 ENCOUNTER — Encounter: Payer: Self-pay | Admitting: Internal Medicine

## 2021-09-12 DIAGNOSIS — I10 Essential (primary) hypertension: Secondary | ICD-10-CM

## 2021-09-12 DIAGNOSIS — R600 Localized edema: Secondary | ICD-10-CM

## 2021-09-12 DIAGNOSIS — Z0000 Encounter for general adult medical examination without abnormal findings: Secondary | ICD-10-CM

## 2021-09-12 MED ORDER — TRIAMCINOLONE ACETONIDE 0.5 % EX OINT: 1.0000 "application " | TOPICAL_OINTMENT | Freq: Two times a day (BID) | CUTANEOUS | 0 refills | Status: DC

## 2021-09-12 MED ORDER — ONDANSETRON HCL 4 MG PO TABS: 4.0000 mg | ORAL_TABLET | Freq: Three times a day (TID) | ORAL | 0 refills | Status: AC | PRN

## 2021-09-12 MED ORDER — HYDROCHLOROTHIAZIDE 12.5 MG PO CAPS: 12.5000 mg | ORAL_CAPSULE | Freq: Every day | ORAL | 0 refills | Status: DC

## 2021-09-12 MED ORDER — FERROUS SULFATE 325 (65 FE) MG PO TABS: 325.0000 mg | ORAL_TABLET | Freq: Every day | ORAL | 0 refills | Status: DC

## 2021-09-12 NOTE — Assessment & Plan Note (Signed)
Medication refilled

## 2021-09-12 NOTE — Patient Instructions (Addendum)
I sent hydrochlorothiazide to your pharmacy; this medication should help with the swelling. Continue taking the amlodipine 5mg ; your blood pressure has was good today   The liquid tylenol is over the counter medication. You can pick it up at the store. The generic name is called acetaminophen   I also refilled your iron supplements, nausea medication and kenalog cream   Your orthopedic appt is coming up 10/03/21

## 2021-09-12 NOTE — Assessment & Plan Note (Signed)
Patient states that she continues to have lower extremity edema.  2+ pitting edema on noted physical exam.  Patient currently on amlodipine 5 mg, possible culprit of the swelling.   PLAN: Started on HCTZ 12.5 daily; consider titrating up if swelling persists or stopping amlodipine.

## 2021-09-12 NOTE — Assessment & Plan Note (Addendum)
Patient's current blood pressure is at goal.  Patient currently taking amlodipine 5 mg.   Patient still experiencing lower extremity edema.  On physical exam 2+ pitting edema.  Will add on HCTZ.  PLAN: Continue amlodipine 5 mg daily; consider stopping if edema persists with addition of HCTZ. Started on HCTZ

## 2021-09-12 NOTE — Progress Notes (Signed)
Internal Medicine Clinic Attending  I saw and evaluated the patient.  I personally confirmed the key portions of the history and exam documented by Dr. Amponsah and I reviewed pertinent patient test results.  The assessment, diagnosis, and plan were formulated together and I agree with the documentation in the resident's note.  

## 2021-09-12 NOTE — Progress Notes (Signed)
CC: BP check; LLE;  medication refill  HPI:  Haley Johnson is a 75 y.o. female with a past medical history stated below and presents today for cc listed above. Please see problem based assessment and plan for additional details.  Past Medical History:  Diagnosis Date   Asthma    Cancer ([place])    GYN [place]   Dermatomyositis ([place]) 05/05/2020   Essential hypertension 05/05/2020   GERD (gastroesophageal reflux disease)    GERD without esophagitis 05/05/2020   Hypercholesteremia    Hypertension     Current Outpatient Medications on File Prior to Visit  Medication Sig Dispense Refill   albuterol (VENTOLIN HFA) 108 (90 Base) MCG/ACT inhaler Inhale 1-2 puffs into the lungs every 6 (six) hours as needed for wheezing or shortness of breath.     amLODipine (NORVASC) 5 MG tablet Take 1 tablet (5 mg total) by mouth daily. 90 tablet 1   DULERA 200-5 MCG/ACT AERO Inhale 2 puffs into the lungs daily as needed for shortness of breath or wheezing.     etodolac (LODINE) 400 MG tablet Take 400 mg by mouth 2 (two) times daily.     folic acid (FOLVITE) 1 MG tablet Take 1 tablet (1 mg total) by mouth daily. 90 tablet 1   Multiple Vitamin (MULTIVITAMIN) tablet Take 1 tablet by mouth daily.     mycophenolate (CELLCEPT) 500 MG tablet Take 1,000 mg by mouth 2 (two) times daily.     predniSONE (DELTASONE) 10 MG tablet TAKE 1 TABLET(10 MG) BY MOUTH DAILY 30 tablet 1   pregabalin (LYRICA) 150 MG capsule Take 1 capsule (150 mg total) by mouth in the morning, at noon, and at bedtime. 90 capsule 2   rosuvastatin (CRESTOR) 40 MG tablet Take 40 mg by mouth daily.     Zinc Oxide (DESITIN MAXIMUM STRENGTH EX) Apply 1 application topically daily as needed (rash prevention). (Patient not taking: Reported on 04/14/2021)     No current facility-administered medications on file prior to visit.    Family History  Problem Relation Age of Onset   Breast cancer Mother    Cancer Daughter     Social History    Socioeconomic History   Marital status: Divorced    Spouse name: Not on file   Number of children: Not on file   Years of education: Not on file   Highest education level: Not on file  Occupational History   Not on file  Tobacco Use   Smoking status: Never   Smokeless tobacco: Never  Substance and Sexual Activity   Alcohol use: Never   Drug use: Never   Sexual activity: Never  Other Topics Concern   Not on file  Social History Narrative   Current Social History 04/14/2021        Patient lives with her son a home which is 1 story. There are no steps up to the entrance.      Patient's method of transportation is personal car.      The highest level of education was high school diploma.      The patient currently retired.      Identified important Relationships are "with my sons"       Pets : None       Interests / Fun:  "Walking a little.  I use to sing in the choir and enjoyed that a lot".       Current Stressors: "When they found the cancer, but I am better  now".       Social Determinants of Health   Financial Resource Strain: Not on file  Food Insecurity: Not on file  Transportation Needs: Not on file  Physical Activity: Not on file  Stress: Not on file  Social Connections: Not on file  Intimate Partner Violence: Not on file    Review of Systems: ROS negative except for what is noted on the assessment and plan.  Vitals:   09/12/21 1049  BP: 125/70  Pulse: 73  Temp: 98.4 F (36.9 C)  TempSrc: Oral  SpO2: 100%  Weight: 169 lb (76.7 kg)  Height: 5\' 2"  (1.575 m)     Physical Exam: Constitutional: well-appearing obese elderly woman sitting in the chair, in no acute distress HENT: normocephalic atraumatic, mucous membranes moist Eyes: conjunctiva non-erythematous Neck: supple Cardiovascular: regular rate and rhythm, no m/r/g Pulmonary/Chest: normal work of breathing on room air, lungs clear to auscultation bilaterally Abdominal: soft, non-tender,  non-distended MSK: normal bulk and tone Neurological: alert & oriented x 3, 5/5 strength in bilateral upper and lower extremities, abnormal gait Skin: warm and dry Psych: normal mood   Assessment & Plan:   See Encounters Tab for problem based charting.  Patient seen with Dr. Jadene Pierini, M.D. [facility], PGY-1 Pager: [phone], Phone: [phone] Date 09/12/2021 Time 11:50 AM

## 2021-09-16 NOTE — Progress Notes (Signed)
Internal Medicine Clinic Attending  I saw and evaluated the patient.  I personally confirmed the key portions of the history and exam documented by Dr. Ariwodo and I reviewed pertinent patient test results.  The assessment, diagnosis, and plan were formulated together and I agree with the documentation in the resident's note.   

## 2021-10-02 ENCOUNTER — Other Ambulatory Visit: Payer: Self-pay

## 2021-10-02 ENCOUNTER — Other Ambulatory Visit: Payer: Self-pay | Admitting: Internal Medicine

## 2021-10-02 ENCOUNTER — Ambulatory Visit (HOSPITAL_COMMUNITY)
Admission: RE | Admit: 2021-10-02 | Discharge: 2021-10-02 | Disposition: A | Discharge status: Home / Self Care | Payer: Medicare PPO | Source: Ambulatory Visit | Attending: Internal Medicine | Admitting: Internal Medicine

## 2021-10-02 DIAGNOSIS — M5136 Other intervertebral disc degeneration, lumbar region: Secondary | ICD-10-CM

## 2021-10-02 DIAGNOSIS — M4807 Spinal stenosis, lumbosacral region: Secondary | ICD-10-CM | POA: Diagnosis present

## 2021-10-03 ENCOUNTER — Ambulatory Visit: Payer: Medicare PPO | Admitting: Orthopaedic Surgery

## 2021-10-21 ENCOUNTER — Other Ambulatory Visit: Payer: Self-pay

## 2021-10-21 ENCOUNTER — Encounter: Payer: Self-pay | Admitting: Orthopaedic Surgery

## 2021-10-21 ENCOUNTER — Ambulatory Visit (INDEPENDENT_AMBULATORY_CARE_PROVIDER_SITE_OTHER): Payer: Medicare PPO | Admitting: Orthopaedic Surgery

## 2021-10-21 DIAGNOSIS — M4804 Spinal stenosis, thoracic region: Secondary | ICD-10-CM | POA: Diagnosis not present

## 2021-10-21 DIAGNOSIS — M48061 Spinal stenosis, lumbar region without neurogenic claudication: Secondary | ICD-10-CM | POA: Insufficient documentation

## 2021-10-21 DIAGNOSIS — M48062 Spinal stenosis, lumbar region with neurogenic claudication: Secondary | ICD-10-CM

## 2021-10-21 NOTE — Progress Notes (Signed)
Office Visit Note   Patient: Haley Johnson           Date of Birth: 21-May-1946           MRN: [id number] Visit Date: 10/21/2021              Requested by: Timothy Lasso, MD [address] [place],  [place] [address] PCP: Timothy Lasso, MD   Assessment & Plan: Visit Diagnoses:  1. Thoracic stenosis   2. Spinal stenosis of lumbar region with neurogenic claudication     Plan: I reviewed with patient and her husband there is some compression which include T11-T12, L3-4 and L4-5.  She has some abnormal signal in the cord at the T11-T12 level.  I would recommend she talk with her rheumatologist about referral to [facility].  She is at increased risk for surgery with low albumin and multilevel compression with lumbar curvature and might benefit from minimally invasive technique to help with areas of compression.  Follow-Up Instructions: No follow-ups on file.   Orders:  No orders of the defined types were placed in this encounter.  No orders of the defined types were placed in this encounter.     Procedures: No procedures performed   Clinical Data: No additional findings.   Subjective: Chief Complaint  Patient presents with   Lower Back - Pain    HPI 75 year old female with dermatomyositis on prednisone.  She has hypoalbuminemia recent GYN surgery had complications of small bowel obstruction after surgery.  She is here with problems with numbness tingling weakness both legs.  She has to use a walker cannot stand longer walk far.  She states she has had pain greater than 3 years.  Lumbar MRI scan 10/02/2021 showed T11-12 spinal stenosis with cord deformity and mild cord signal abnormality.  She also had advanced stenosis at the L 3-4 and also L4-5.  Patient is followed by rheumatology at [facility]. Patient's conus extends to the spine team which is at the  L2 level. Review of Systems negative for fever chills negative for bowel bladder associated symptoms.   Positive for protein caloric malnutrition with hypoalbuminemia.   Objective: Vital Signs: BP (!) 147/66   Ht 5\' 2"  (1.575 m)   Wt 169 lb (76.7 kg)   BMI 30.91 kg/m   Physical Exam Constitutional:      Appearance: She is well-developed.  HENT:     Head: Normocephalic.     Right Ear: External ear normal.     Left Ear: External ear normal. There is no impacted cerumen.  Eyes:     Pupils: Pupils are equal, round, and reactive to light.  Neck:     Thyroid: No thyromegaly.     Trachea: No tracheal deviation.  Cardiovascular:     Rate and Rhythm: Normal rate.  Pulmonary:     Effort: Pulmonary effort is normal.  Musculoskeletal:     Cervical back: No rigidity.  Skin:    General: Skin is warm and dry.  Neurological:     Mental Status: She is alert and oriented to person, place, and time.  Psychiatric:        Behavior: Behavior normal.    Ortho Exam patient is in a wheelchair.  She has ankle dorsiflexion plantarflexion intact.  No rash over exposed skin.  Specialty Comments:  No specialty comments available.  Imaging: CLINICAL DATA:  Lumbosacral osteoarthritis.   EXAM: MRI LUMBAR SPINE WITHOUT CONTRAST   TECHNIQUE: Multiplanar, multisequence MR imaging of  the lumbar spine was performed. No intravenous contrast was administered.   COMPARISON:  None.   FINDINGS: Segmentation:  5 lumbar type vertebrae   Alignment:  Mild scoliosis.   Vertebrae:  No fracture, evidence of discitis, or bone lesion.   Conus medullaris and cauda equina: Conus extends to the L2 level. Conus appears normal. Cord T2 hyperintensity at T11-12 where there is flattening from degeneration. Cauda equina redundancy due to the degree of severe spinal stenosis.   Paraspinal and other soft tissues: Negative for perispinal mass or inflammation.   Disc levels:   T11-12: Disc collapse with central herniation and dorsal epidural fat expansion causing spinal stenosis with mild cord wasting.  T2 hyperintensity in the cord that is mild. Biforaminal impingement.   T12- L1: Disc narrowing and ventral spondylitic spurring. Mild facet spurring   L1-L2: Disc collapse and endplate degeneration. Bilateral facet spurring. Moderate right foraminal narrowing   L2-L3: Disc collapse with biforaminal bulging and endplate degeneration. Bilateral facet spurring. Compressive spinal stenosis. Biforaminal compression.   L3-L4: Disc collapse with circumferential bulging. Advanced facet osteoarthritis with ligamentum flavum thickening. Bilateral facet spurring. Severe biforaminal impingement, especially on the left   L4-L5: Disc narrowing and bulging with endplate degeneration and right foraminal protrusion. Bilateral facet spurring. Right foraminal impingement. Mild spinal stenosis   L5-S1:Disc collapse and endplate degeneration with bulging and ridging. Bilateral facet spurring. Biforaminal impingement that is moderate.   IMPRESSION: 1. Advanced and generalized lumbar spine degeneration with mild scoliosis. 2. T11-12 spinal stenosis with cord deformity and mild cord signal abnormality. 3. Advanced spinal and foraminal stenosis at L2-3 and L3-4. 4. L4-5 advanced right foraminal impingement. 5. L5-S1 bilateral foraminal impingement.     Electronically Signed   By: Jorje Guild M.D.   On: 10/03/2021 10:27     PMFS History: Patient Active Problem List   Diagnosis Date Noted   Thoracic stenosis 10/21/2021   Spinal stenosis of lumbar region 10/21/2021   Healthcare maintenance 08/13/2021   Malnutrition of moderate degree 07/17/2020   Dysphagia    Gastroesophageal reflux disease    DDD (degenerative disc disease), lumbosacral    Abnormality of gait    Fall at home, initial encounter 07/15/2020   Fall (on)(from) sidewalk curb, subsequent encounter 07/15/2020   Skin breakdown 07/15/2020   Diarrhea 05/30/2020   Physical deconditioning 05/30/2020   Pressure injury of skin  05/08/2020   Postherpetic neuralgia 05/06/2020   Pelvic abscess in female 05/05/2020   Edema of all four extremities 05/05/2020   Lactic acidosis 05/05/2020   Essential hypertension 05/05/2020   GERD without esophagitis 05/05/2020   Dermatomyositis ([place]) 05/05/2020   Hypoalbuminemia due to protein-calorie malnutrition ([place]) 05/05/2020   Mixed hyperlipidemia 05/05/2020   Past Medical History:  Diagnosis Date   Asthma    Cancer ([place])    GYN [place]   Dermatomyositis ([place]) 05/05/2020   Essential hypertension 05/05/2020   GERD (gastroesophageal reflux disease)    GERD without esophagitis 05/05/2020   Hypercholesteremia    Hypertension     Family History  Problem Relation Age of Onset   Breast cancer Mother    Cancer Daughter     No past surgical history on file. Social History   Occupational History   Not on file  Tobacco Use   Smoking status: Never   Smokeless tobacco: Never  Substance and Sexual Activity   Alcohol use: Never   Drug use: Never   Sexual activity: Never

## 2021-11-04 DIAGNOSIS — M3390 Dermatopolymyositis, unspecified, organ involvement unspecified: Secondary | ICD-10-CM | POA: Diagnosis not present

## 2021-11-09 DIAGNOSIS — M3390 Dermatopolymyositis, unspecified, organ involvement unspecified: Secondary | ICD-10-CM | POA: Diagnosis not present

## 2021-11-11 ENCOUNTER — Telehealth: Payer: Self-pay

## 2021-11-11 ENCOUNTER — Other Ambulatory Visit: Payer: Self-pay

## 2021-11-11 ENCOUNTER — Encounter (HOSPITAL_COMMUNITY): Payer: Self-pay

## 2021-11-11 ENCOUNTER — Ambulatory Visit (HOSPITAL_COMMUNITY)
Admission: EM | Admit: 2021-11-11 | Discharge: 2021-11-11 | Disposition: A | Discharge status: Home / Self Care | Payer: Medicare PPO | Attending: Emergency Medicine | Admitting: Emergency Medicine

## 2021-11-11 DIAGNOSIS — M549 Dorsalgia, unspecified: Secondary | ICD-10-CM | POA: Diagnosis not present

## 2021-11-11 DIAGNOSIS — Z79899 Other long term (current) drug therapy: Secondary | ICD-10-CM | POA: Diagnosis not present

## 2021-11-11 DIAGNOSIS — Z0189 Encounter for other specified special examinations: Secondary | ICD-10-CM | POA: Diagnosis not present

## 2021-11-11 LAB — COMPREHENSIVE METABOLIC PANEL
ALT: 24 U/L (ref 0–44)
AST: 33 U/L (ref 15–41)
Albumin: 3.2 g/dL — ABNORMAL LOW (ref 3.5–5.0)
Alkaline Phosphatase: 68 U/L (ref 38–126)
Anion gap: 6 (ref 5–15)
BUN: 11 mg/dL (ref 8–23)
CO2: 27 mmol/L (ref 22–32)
Calcium: 9.7 mg/dL (ref 8.9–10.3)
Chloride: 102 mmol/L (ref 98–111)
Creatinine, Ser: 0.83 mg/dL (ref 0.44–1.00)
GFR, Estimated: >60 mL/min (ref >60)
Glucose, Bld: 87 mg/dL (ref 70–99)
Potassium: 3.8 mmol/L (ref 3.5–5.1)
Sodium: 135 mmol/L (ref 135–145)
Total Bilirubin: 0.5 mg/dL (ref 0.3–1.2)
Total Protein: 8.5 g/dL — ABNORMAL HIGH (ref 6.5–8.1)

## 2021-11-11 LAB — CBC WITH DIFFERENTIAL/PLATELET
Abs Immature Granulocytes: 0.03 10*3/uL (ref 0.00–0.07)
Basophils Absolute: 0 10*3/uL (ref 0.0–0.1)
Basophils Relative: 0 %
Eosinophils Absolute: 0.1 10*3/uL (ref 0.0–0.5)
Eosinophils Relative: 1 %
HCT: 39.1 % (ref 36.0–46.0)
Hemoglobin: 12.7 g/dL (ref 12.0–15.0)
Immature Granulocytes: 0 %
Lymphocytes Relative: 15 %
Lymphs Abs: 1.1 10*3/uL (ref 0.7–4.0)
MCH: 32.2 pg (ref 26.0–34.0)
MCHC: 32.5 g/dL (ref 30.0–36.0)
MCV: 99 fL (ref 80.0–100.0)
Monocytes Absolute: 0.9 10*3/uL (ref 0.1–1.0)
Monocytes Relative: 12 %
Neutro Abs: 5.1 10*3/uL (ref 1.7–7.7)
Neutrophils Relative %: 72 %
Platelets: 202 10*3/uL (ref 150–400)
RBC: 3.95 MIL/uL (ref 3.87–5.11)
RDW: 13.5 % (ref 11.5–15.5)
WBC: 7.3 10*3/uL (ref 4.0–10.5)
nRBC: 0 % (ref 0.0–0.2)

## 2021-11-11 MED ORDER — BACLOFEN 10 MG PO TABS: 10.0000 mg | ORAL_TABLET | Freq: Three times a day (TID) | ORAL | 0 refills | Status: AC

## 2021-11-11 NOTE — Discharge Instructions (Signed)
If your potassium is elevated, you will need to go to the emergency department. I will contact you by phone with your potassium results as soon as they come in today.

## 2021-11-11 NOTE — ED Triage Notes (Signed)
Patient presents to Urgent Care for elevated potassium levels. She states her infusion nurse drew lab work and her potassium levels were elevated. She reports she was taking OTC potassium supplements. She was unaware that this would increase her K+ level. She states the [facility] primary care nurse instructed her to go to ED or UC for lab work. She also states she needs a refill on her mobic prescription for her back pain.  Denies chest pain.

## 2021-11-11 NOTE — Telephone Encounter (Signed)
Return call to pt's son,Rayvone Trenton Gammon. Stated pt receives home infusions from [facility] and received a call that pt needs her cholesterol and heart checked as soon as possible. He could not give me anymore information; pt stated she's not having any chest pain or any other c/o's when I asked her (on 3-way call). Informed son no available appts today; he stated she needs to seen urgently. Appt given for tomorrow with her PCP @ 1415PM which is first available. Stated he might take her to the ED.

## 2021-11-11 NOTE — ED Provider Notes (Signed)
[place]  ____________________________________________  Time seen: Approximately 12:32 PM  I have reviewed the triage vital signs and the nursing notes.   HISTORY  Chief Complaint Labs Only and Back Pain   Historian Patient     HPI Haley Johnson is a 75 y.o. female presents to the urgent care to have her potassium checked.  Patient states that her last blood work indicated possible hyperkalemia.  Patient states that she has hypokalemia at baseline and has been taking supplemental potassium.  Patient states that her gynecologist oncologist referred her to the urgent care to have repeat potassium.  Patient denies chest pain, chest tightness or abdominal pain.  She states that she has been compliant with her antihypertensives but states that she has been more stressed today than usual.   Past Medical History:  Diagnosis Date   Asthma    Cancer ([place])    GYN CA   Dermatomyositis ([place]) 05/05/2020   Essential hypertension 05/05/2020   GERD (gastroesophageal reflux disease)    GERD without esophagitis 05/05/2020   Hypercholesteremia    Hypertension      Immunizations up to date:  Yes.     Past Medical History:  Diagnosis Date   Asthma    Cancer ([place])    GYN CA   Dermatomyositis ([place]) 05/05/2020   Essential hypertension 05/05/2020   GERD (gastroesophageal reflux disease)    GERD without esophagitis 05/05/2020   Hypercholesteremia    Hypertension     Patient Active Problem List   Diagnosis Date Noted   Thoracic stenosis 10/21/2021   Spinal stenosis of lumbar region 10/21/2021   Healthcare maintenance 08/13/2021   Malnutrition of moderate degree 07/17/2020   Dysphagia    Gastroesophageal reflux disease    DDD (degenerative disc disease), lumbosacral    Abnormality of gait    Fall at home, initial encounter 07/15/2020   Fall (on)(from) sidewalk curb, subsequent encounter 07/15/2020   Skin breakdown 07/15/2020   Diarrhea 05/30/2020   Physical  deconditioning 05/30/2020   Pressure injury of skin 05/08/2020   Postherpetic neuralgia 05/06/2020   Pelvic abscess in female 05/05/2020   Edema of all four extremities 05/05/2020   Lactic acidosis 05/05/2020   Essential hypertension 05/05/2020   GERD without esophagitis 05/05/2020   Dermatomyositis ([place]) 05/05/2020   Hypoalbuminemia due to protein-calorie malnutrition ([place]) 05/05/2020   Mixed hyperlipidemia 05/05/2020    History reviewed. No pertinent surgical history.  Prior to Admission medications   Medication Sig Start Date End Date Taking? Authorizing Provider  baclofen (LIORESAL) 10 MG tablet Take 1 tablet (10 mg total) by mouth 3 (three) times daily for 10 days. 11/11/21 11/21/21 Yes Vallarie Mare M, PA-C  hydroxychloroquine (PLAQUENIL) 200 MG tablet Take by mouth. 11/25/20 11/25/21 Yes [provider]  albuterol (VENTOLIN HFA) 108 (90 Base) MCG/ACT inhaler Inhale 1-2 puffs into the lungs every 6 (six) hours as needed for wheezing or shortness of breath.    [provider]  amLODipine (NORVASC) 5 MG tablet Take 1 tablet (5 mg total) by mouth daily. 08/13/21 11/11/21  Atway, Rayann N, DO  amLODipine (NORVASC) 5 MG tablet Take by mouth.    [provider]  DULERA 200-5 MCG/ACT AERO Inhale 2 puffs into the lungs daily as needed for shortness of breath or wheezing. 05/23/20   [provider]  etodolac (LODINE) 400 MG tablet Take 400 mg by mouth 2 (two) times daily. 04/17/20   [provider]  ferrous sulfate 325 (65 FE) MG tablet  Take 1 tablet (325 mg total) by mouth daily. 09/12/21 12/11/21  Harvie Heck, MD  folic acid (FOLVITE) 1 MG tablet Take 1 tablet (1 mg total) by mouth daily. 08/13/21 11/11/21  Atway, Jeananne Rama, DO  hydrochlorothiazide (MICROZIDE) 12.5 MG capsule Take 1 capsule (12.5 mg total) by mouth daily. 09/12/21 12/11/21  Harvie Heck, MD  melatonin 5 MG TABS Take by mouth.    [provider]  Multiple Vitamin (MULTIVITAMIN)  tablet Take 1 tablet by mouth daily.    [provider]  mycophenolate (CELLCEPT) 500 MG tablet Take 1,000 mg by mouth 2 (two) times daily.    [provider]  predniSONE (DELTASONE) 10 MG tablet TAKE 1 TABLET(10 MG) BY MOUTH DAILY 12/30/20   Madalyn Rob, MD  pregabalin (LYRICA) 150 MG capsule Take 1 capsule (150 mg total) by mouth in the morning, at noon, and at bedtime. 09/05/21 09/05/22  Lacinda Axon, MD  rosuvastatin (CRESTOR) 40 MG tablet Take 40 mg by mouth daily. 03/19/20   [provider]  triamcinolone ointment (KENALOG) 0.5 % Apply 1 application topically 2 (two) times daily. 09/12/21   Harvie Heck, MD  Zinc Oxide (DESITIN MAXIMUM STRENGTH EX) Apply 1 application topically daily as needed (rash prevention).    [provider]    Allergies Chocolate, Cocoa, Blueberry flavor, and Tomato  Family History  Problem Relation Age of Onset   Breast cancer Mother    Cancer Daughter     Social History Social History   Tobacco Use   Smoking status: Never   Smokeless tobacco: Never  Substance Use Topics   Alcohol use: Never   Drug use: Never     Review of Systems  Constitutional: No fever/chills Eyes:  No discharge ENT: No upper respiratory complaints. Respiratory: no cough. No SOB/ use of accessory muscles to breath Gastrointestinal:   No nausea, no vomiting.  No diarrhea.  No constipation. Musculoskeletal: Negative for musculoskeletal pain. Skin: Negative for rash, abrasions, lacerations, ecchymosis.    ____________________________________________   PHYSICAL EXAM:  VITAL SIGNS: ED Triage Vitals  Enc Vitals Group     BP 11/11/21 1228 (S) (!) 187/88     Pulse Rate 11/11/21 1228 85     Resp 11/11/21 1228 16     Temp --      Temp Source 11/11/21 1228 Oral     SpO2 11/11/21 1228 94 %     Weight --      Height --      Head Circumference --      Peak Flow --      Pain Score 11/11/21 1227 8     Pain Loc --      Pain Edu? --       Excl. in [place]? --      Constitutional: Alert and oriented. Well appearing and in no acute distress. Eyes: Conjunctivae are normal. PERRL. EOMI. Head: Atraumatic. ENT:      Nose: No congestion/rhinnorhea.      Mouth/Throat: Mucous membranes are moist.  Neck: No stridor.  No cervical spine tenderness to palpation. Cardiovascular: Normal rate, regular rhythm. Normal S1 and S2.  Good peripheral circulation. Respiratory: Normal respiratory effort without tachypnea or retractions. Lungs CTAB. Good air entry to the bases with no decreased or absent breath sounds Gastrointestinal: Bowel sounds x 4 quadrants. Soft and nontender to palpation. No guarding or rigidity. No distention. Musculoskeletal: Full range of motion to all extremities. No obvious deformities noted Neurologic:  Normal for age. No  gross focal neurologic deficits are appreciated.  Skin:  Skin is warm, dry and intact. No rash noted. Psychiatric: Mood and affect are normal for age. Speech and behavior are normal.   ____________________________________________   LABS (all labs ordered are listed, but only abnormal results are displayed)  Labs Reviewed  COMPREHENSIVE METABOLIC PANEL - Abnormal; Notable for the following components:      Result Value   Total Protein 8.5 (*)    Albumin 3.2 (*)    All other components within normal limits  CBC WITH DIFFERENTIAL/PLATELET   ____________________________________________  EKG   ____________________________________________  RADIOLOGY   No results found.  ____________________________________________    PROCEDURES  Procedure(s) performed:     Procedures     Medications - No data to display   ____________________________________________   INITIAL IMPRESSION / ASSESSMENT AND PLAN / ED COURSE  Pertinent labs & imaging results that were available during my care of the patient were reviewed by me and considered in my medical decision making (see chart for  details).      Assessment and Plan:  Concern for Hyperkalemia  75 year old female presents to the urgent care to have potassium checked.  Potassium within range.  Suspect hemolyzed blood sample.  Recommended patient continuing her medications as directed by PCP.  Patient did request a muscle relaxer and baclofen was prescribed.  EKG showed normal sinus rhythm without peaked T waves or ST segment elevation.   ____________________________________________  FINAL CLINICAL IMPRESSION(S) / ED DIAGNOSES  Final diagnoses:  Encounter for routine laboratory testing      NEW MEDICATIONS STARTED DURING THIS VISIT:  ED Discharge Orders          Ordered    baclofen (LIORESAL) 10 MG tablet  3 times daily        11/11/21 1435                This chart was dictated using voice recognition software/Dragon. Despite best efforts to proofread, errors can occur which can change the meaning. Any change was purely unintentional.     Lannie Fields, PA-C 11/11/21 1436

## 2021-11-11 NOTE — Telephone Encounter (Signed)
Pt's son requesting to speak with a nurse about his mother. Please call pt back.

## 2021-11-12 ENCOUNTER — Encounter: Payer: Medicare PPO | Admitting: Internal Medicine

## 2021-11-25 ENCOUNTER — Other Ambulatory Visit: Payer: Self-pay

## 2021-12-04 MED ORDER — FERROUS SULFATE 325 (65 FE) MG PO TABS: 325.0000 mg | ORAL_TABLET | Freq: Every day | ORAL | 0 refills | Status: DC

## 2021-12-26 ENCOUNTER — Other Ambulatory Visit: Payer: Self-pay | Admitting: Physician Assistant

## 2021-12-26 ENCOUNTER — Other Ambulatory Visit (HOSPITAL_COMMUNITY): Payer: Self-pay | Admitting: Obstetrics and Gynecology

## 2021-12-26 ENCOUNTER — Other Ambulatory Visit: Payer: Self-pay | Admitting: Obstetrics and Gynecology

## 2021-12-26 DIAGNOSIS — C579 Malignant neoplasm of female genital organ, unspecified: Secondary | ICD-10-CM

## 2021-12-26 DIAGNOSIS — R978 Other abnormal tumor markers: Secondary | ICD-10-CM

## 2022-01-02 DIAGNOSIS — M3390 Dermatopolymyositis, unspecified, organ involvement unspecified: Secondary | ICD-10-CM | POA: Diagnosis not present

## 2022-01-06 ENCOUNTER — Encounter (HOSPITAL_COMMUNITY): Payer: Self-pay

## 2022-01-06 ENCOUNTER — Ambulatory Visit (HOSPITAL_COMMUNITY): Payer: Medicare PPO

## 2022-01-06 DIAGNOSIS — M3390 Dermatopolymyositis, unspecified, organ involvement unspecified: Secondary | ICD-10-CM | POA: Diagnosis not present

## 2022-01-07 ENCOUNTER — Other Ambulatory Visit: Payer: Self-pay

## 2022-01-07 MED ORDER — PREGABALIN 150 MG PO CAPS: 150.0000 mg | ORAL_CAPSULE | Freq: Three times a day (TID) | ORAL | 2 refills | Status: DC

## 2022-01-09 ENCOUNTER — Telehealth: Payer: Self-pay

## 2022-01-09 ENCOUNTER — Other Ambulatory Visit: Payer: Self-pay | Admitting: *Deleted

## 2022-01-09 MED ORDER — ROSUVASTATIN CALCIUM 40 MG PO TABS: 40.0000 mg | ORAL_TABLET | Freq: Every day | ORAL | 2 refills | Status: DC

## 2022-01-09 MED ORDER — AMLODIPINE BESYLATE 5 MG PO TABS: 5.0000 mg | ORAL_TABLET | Freq: Every day | ORAL | 1 refills | Status: DC

## 2022-01-09 MED ORDER — TRIAMCINOLONE ACETONIDE 0.5 % EX OINT: 1.0000 "application " | TOPICAL_OINTMENT | Freq: Two times a day (BID) | CUTANEOUS | 0 refills | Status: DC

## 2022-01-09 MED ORDER — HYDROCHLOROTHIAZIDE 12.5 MG PO CAPS: 12.5000 mg | ORAL_CAPSULE | Freq: Every day | ORAL | 0 refills | Status: DC

## 2022-01-09 MED ORDER — PREGABALIN 150 MG PO CAPS: 150.0000 mg | ORAL_CAPSULE | Freq: Three times a day (TID) | ORAL | 2 refills | Status: DC

## 2022-01-09 NOTE — Telephone Encounter (Signed)
Requesting medications refill. Please call pt's son back.

## 2022-01-09 NOTE — Telephone Encounter (Signed)
Patient called in with son requesting refills on all meds. Some requests are not on patient's med list. States she gets them from [place] [facility]. She is advised to contact [facility] for those refills. Will forward the rest.  Patient is requesting 90 day supply on all meds as this is cheaper for her.

## 2022-01-16 ENCOUNTER — Other Ambulatory Visit: Payer: Self-pay

## 2022-01-16 ENCOUNTER — Ambulatory Visit (HOSPITAL_COMMUNITY)
Admission: RE | Admit: 2022-01-16 | Discharge: 2022-01-16 | Disposition: A | Discharge status: Home / Self Care | Payer: Medicare PPO | Source: Ambulatory Visit | Attending: Obstetrics and Gynecology | Admitting: Obstetrics and Gynecology

## 2022-01-16 DIAGNOSIS — C579 Malignant neoplasm of female genital organ, unspecified: Secondary | ICD-10-CM | POA: Insufficient documentation

## 2022-01-16 DIAGNOSIS — R918 Other nonspecific abnormal finding of lung field: Secondary | ICD-10-CM | POA: Insufficient documentation

## 2022-01-16 DIAGNOSIS — E042 Nontoxic multinodular goiter: Secondary | ICD-10-CM | POA: Diagnosis not present

## 2022-01-16 DIAGNOSIS — K8689 Other specified diseases of pancreas: Secondary | ICD-10-CM | POA: Diagnosis not present

## 2022-01-16 DIAGNOSIS — R978 Other abnormal tumor markers: Secondary | ICD-10-CM | POA: Diagnosis not present

## 2022-01-16 DIAGNOSIS — R911 Solitary pulmonary nodule: Secondary | ICD-10-CM | POA: Diagnosis not present

## 2022-01-16 MED ORDER — IOHEXOL 300 MG/ML  SOLN
100.0000 mL | Freq: Once | INTRAMUSCULAR | Status: AC | PRN
  Administered 2022-01-16: 100 mL via INTRAVENOUS

## 2022-01-30 DIAGNOSIS — R1909 Other intra-abdominal and pelvic swelling, mass and lump: Secondary | ICD-10-CM | POA: Diagnosis not present

## 2022-01-30 DIAGNOSIS — C785 Secondary malignant neoplasm of large intestine and rectum: Secondary | ICD-10-CM | POA: Diagnosis not present

## 2022-01-30 DIAGNOSIS — C579 Malignant neoplasm of female genital organ, unspecified: Secondary | ICD-10-CM | POA: Diagnosis not present

## 2022-01-30 DIAGNOSIS — C763 Malignant neoplasm of pelvis: Secondary | ICD-10-CM | POA: Diagnosis not present

## 2022-02-05 DIAGNOSIS — Z7952 Long term (current) use of systemic steroids: Secondary | ICD-10-CM | POA: Diagnosis not present

## 2022-02-05 DIAGNOSIS — Z9225 Personal history of immunosupression therapy: Secondary | ICD-10-CM | POA: Diagnosis not present

## 2022-02-05 DIAGNOSIS — C579 Malignant neoplasm of female genital organ, unspecified: Secondary | ICD-10-CM | POA: Diagnosis not present

## 2022-02-05 DIAGNOSIS — M3390 Dermatopolymyositis, unspecified, organ involvement unspecified: Secondary | ICD-10-CM | POA: Diagnosis not present

## 2022-02-06 DIAGNOSIS — Z5181 Encounter for therapeutic drug level monitoring: Secondary | ICD-10-CM | POA: Diagnosis not present

## 2022-02-06 DIAGNOSIS — Z79899 Other long term (current) drug therapy: Secondary | ICD-10-CM | POA: Diagnosis not present

## 2022-02-13 DIAGNOSIS — C579 Malignant neoplasm of female genital organ, unspecified: Secondary | ICD-10-CM | POA: Diagnosis not present

## 2022-02-13 DIAGNOSIS — Z5111 Encounter for antineoplastic chemotherapy: Secondary | ICD-10-CM | POA: Diagnosis not present

## 2022-02-13 DIAGNOSIS — Z79899 Other long term (current) drug therapy: Secondary | ICD-10-CM | POA: Diagnosis not present

## 2022-02-13 DIAGNOSIS — Z5181 Encounter for therapeutic drug level monitoring: Secondary | ICD-10-CM | POA: Diagnosis not present

## 2022-02-25 ENCOUNTER — Other Ambulatory Visit: Payer: Self-pay | Admitting: Internal Medicine

## 2022-03-02 DIAGNOSIS — M3390 Dermatopolymyositis, unspecified, organ involvement unspecified: Secondary | ICD-10-CM | POA: Diagnosis not present

## 2022-03-03 DIAGNOSIS — M3390 Dermatopolymyositis, unspecified, organ involvement unspecified: Secondary | ICD-10-CM | POA: Diagnosis not present

## 2022-03-04 ENCOUNTER — Ambulatory Visit: Payer: Self-pay

## 2022-03-04 ENCOUNTER — Telehealth: Payer: Self-pay

## 2022-03-04 ENCOUNTER — Other Ambulatory Visit: Payer: Self-pay

## 2022-03-04 NOTE — Telephone Encounter (Signed)
pregabalin (LYRICA) 150 MG capsule, REFILL REQUEST @ [id number] - [place], [place] - [address] AT [place]. ?

## 2022-03-04 NOTE — Telephone Encounter (Signed)
#  [phone] with 2 refills sent 01/09/22 to requested Pharmacy. Attempted to relay this to patient. No answer. Left message on VM that there are 2 refills at [facility] from 01/09/22. ?

## 2022-03-04 NOTE — Chronic Care Management (AMB) (Signed)
? ?  03/04/2022 ? ?Haley Johnson ?1946-03-02 ?[id number] ? ?Incoming call from patient's son, Rayvonne requesting coupons for Ensure.  He states his Mom drinks them everyday.  This RNCM agreed to mail coupons to patient and mailed via [employer],  to [address] [place], [place] [address]. ?Deb ?Johnney Killian, RN, BSN, CCM ?Care Management Coordinator ?[facility] ?Phone: [phone]/UUV: [phone]  ?

## 2022-03-05 MED ORDER — FERROUS SULFATE 325 (65 FE) MG PO TABS: 325.0000 mg | ORAL_TABLET | Freq: Every day | ORAL | 0 refills | Status: DC

## 2022-03-06 DIAGNOSIS — C577 Malignant neoplasm of other specified female genital organs: Secondary | ICD-10-CM | POA: Diagnosis not present

## 2022-03-06 DIAGNOSIS — Z1509 Genetic susceptibility to other malignant neoplasm: Secondary | ICD-10-CM | POA: Diagnosis not present

## 2022-03-06 DIAGNOSIS — Z1501 Genetic susceptibility to malignant neoplasm of breast: Secondary | ICD-10-CM | POA: Diagnosis not present

## 2022-03-06 DIAGNOSIS — G893 Neoplasm related pain (acute) (chronic): Secondary | ICD-10-CM | POA: Diagnosis not present

## 2022-03-06 DIAGNOSIS — Z79899 Other long term (current) drug therapy: Secondary | ICD-10-CM | POA: Diagnosis not present

## 2022-03-06 DIAGNOSIS — C579 Malignant neoplasm of female genital organ, unspecified: Secondary | ICD-10-CM | POA: Diagnosis not present

## 2022-03-06 DIAGNOSIS — M3313 Other dermatomyositis without myopathy: Secondary | ICD-10-CM | POA: Diagnosis not present

## 2022-03-06 DIAGNOSIS — J45909 Unspecified asthma, uncomplicated: Secondary | ICD-10-CM | POA: Diagnosis not present

## 2022-03-06 DIAGNOSIS — R5383 Other fatigue: Secondary | ICD-10-CM | POA: Diagnosis not present

## 2022-03-06 DIAGNOSIS — Z5111 Encounter for antineoplastic chemotherapy: Secondary | ICD-10-CM | POA: Diagnosis not present

## 2022-03-06 DIAGNOSIS — I10 Essential (primary) hypertension: Secondary | ICD-10-CM | POA: Diagnosis not present

## 2022-03-06 DIAGNOSIS — E119 Type 2 diabetes mellitus without complications: Secondary | ICD-10-CM | POA: Diagnosis not present

## 2022-03-09 ENCOUNTER — Ambulatory Visit (INDEPENDENT_AMBULATORY_CARE_PROVIDER_SITE_OTHER): Payer: Medicare PPO | Admitting: Internal Medicine

## 2022-03-09 DIAGNOSIS — M5137 Other intervertebral disc degeneration, lumbosacral region: Secondary | ICD-10-CM

## 2022-03-09 DIAGNOSIS — M79652 Pain in left thigh: Secondary | ICD-10-CM | POA: Diagnosis not present

## 2022-03-09 DIAGNOSIS — M79659 Pain in unspecified thigh: Secondary | ICD-10-CM | POA: Insufficient documentation

## 2022-03-09 MED ORDER — DICLOFENAC SODIUM 1 % EX GEL: 2.0000 g | Freq: Four times a day (QID) | CUTANEOUS | 1 refills | Status: DC

## 2022-03-09 NOTE — Assessment & Plan Note (Signed)
Patient has a history of degenerative disc disease and severe canal stenosis of her lumbosacral spine and sciatica like symptoms. She notes that her low back pain and neuropathy has been stable and she has been taking lyrica 150 mg tid. Recommend follow up of this at her in-person appt in May.  ?

## 2022-03-09 NOTE — Assessment & Plan Note (Signed)
Patient states that over the past month she has had intermittent left thigh pain, which she describes as dull and achy. She denies any recent falls, injuries, or heavy lifting. She states that her pain is worse with walking and it is relieved with sitting and also with the use of a heating pad. The patient is specifically requesting a cream to apply to her thigh to help alleviate her symptoms. Although her pain seems that it is muscular in nature, will send in topical voltaren gel cream. Patient has an in-person appt on 5/4 with the [facility] and advised her that if her pain worsens before then, to call so she can be seen sooner.  ? ?Plan: ?- Voltaren gel to left thigh  ?- Appt on 5/4 or call to be seen sooner if pain worsens ?

## 2022-03-09 NOTE — Progress Notes (Signed)
?  [facility] Telephone Encounter ?Continuity Care Appointment ? ?HPI:  ?This telephone encounter was created for [name] on 03/09/2022 for the following purpose/cc left thigh pain.  ? ?Past Medical History:  ?Past Medical History:  ?Diagnosis Date  ? Asthma   ? Cancer [facility])   ? GYN CA  ? Dermatomyositis ([place]) 05/05/2020  ? Essential hypertension 05/05/2020  ? GERD (gastroesophageal reflux disease)   ? GERD without esophagitis 05/05/2020  ? Hypercholesteremia   ? Hypertension   ?  ? ?ROS:  ?Review of Systems  ?Constitutional:  Negative for chills and fever.  ?HENT: Negative.    ?Respiratory: Negative.    ?Cardiovascular: Negative.   ?Gastrointestinal:  Negative for diarrhea, nausea and vomiting.  ?Genitourinary: Negative.   ?Musculoskeletal:  Positive for back pain and myalgias. Negative for falls.  ?Neurological:  Negative for dizziness and headaches.  ?  ? ?Assessment / Plan / Recommendations:  ?Please see A&P under problem oriented charting for assessment of the patient's acute and chronic medical conditions.  ?As always, pt is advised that if symptoms worsen or new symptoms arise, they should go to an urgent care facility or to to ER for further evaluation.  ? ?Consent and Medical Decision Making:  ?Patient discussed with Dr. Dareen Piano ?This is a telephone encounter between [facility] [employer] and [employer] on 03/09/2022 for left thigh pain. The visit was conducted with the patient located at home and Dorethea Clan at [facility]. The patient's identity was confirmed using their DOB and current address. The patient has consented to being evaluated through a telephone encounter and understands the associated risks (an examination cannot be done and the patient may need to come in for an appointment) / benefits (allows the patient to remain at home, decreasing exposure to coronavirus). I personally spent 11 minutes on medical discussion.   ? ? ?

## 2022-03-11 NOTE — Progress Notes (Signed)
Internal Medicine Clinic Attending ° °Case discussed with Dr. Atway  At the time of the visit.  We reviewed the resident’s history and exam and pertinent patient test results.  I agree with the assessment, diagnosis, and plan of care documented in the resident’s note.  °

## 2022-03-26 ENCOUNTER — Encounter: Payer: Self-pay | Admitting: Internal Medicine

## 2022-03-26 ENCOUNTER — Other Ambulatory Visit: Payer: Self-pay

## 2022-03-26 ENCOUNTER — Ambulatory Visit (INDEPENDENT_AMBULATORY_CARE_PROVIDER_SITE_OTHER): Payer: Medicare PPO | Admitting: Internal Medicine

## 2022-03-26 DIAGNOSIS — M79652 Pain in left thigh: Secondary | ICD-10-CM | POA: Diagnosis not present

## 2022-03-26 DIAGNOSIS — M5137 Other intervertebral disc degeneration, lumbosacral region: Secondary | ICD-10-CM

## 2022-03-26 DIAGNOSIS — I10 Essential (primary) hypertension: Secondary | ICD-10-CM

## 2022-03-26 MED ORDER — PREDNISONE 5 MG PO TABS: 7.5000 mg | ORAL_TABLET | Freq: Every day | ORAL | Status: DC

## 2022-03-26 MED ORDER — IBUPROFEN 200 MG PO TABS: 200.0000 mg | ORAL_TABLET | Freq: Three times a day (TID) | ORAL | 0 refills | Status: AC | PRN

## 2022-03-26 MED ORDER — HYDROCHLOROTHIAZIDE 12.5 MG PO CAPS: 12.5000 mg | ORAL_CAPSULE | Freq: Every day | ORAL | 0 refills | Status: DC

## 2022-03-26 MED ORDER — ALBUTEROL SULFATE HFA 108 (90 BASE) MCG/ACT IN AERS: 1.0000 | INHALATION_SPRAY | Freq: Four times a day (QID) | RESPIRATORY_TRACT | 0 refills | Status: DC | PRN

## 2022-03-26 NOTE — Patient Instructions (Signed)
Thank you, [name] for allowing [place] to provide your care today. Today we discussed left thigh pain and medication refill.   ? ?I have ordered the following labs for you: ? ?Lab Orders  ?No laboratory test(s) ordered today  ?  ? ?Tests ordered today: ? ?NONE ? ?Referrals ordered today:  ? ?Referral Orders  ?No referral(s) requested today  ?  ? ?I have ordered the following medication/changed the following medications:  ? ?Stop the following medications: ?Medications Discontinued During This Encounter  ?Medication Reason  ? albuterol (VENTOLIN HFA) 108 (90 Base) MCG/ACT inhaler Reorder  ? hydrochlorothiazide (MICROZIDE) 12.5 MG capsule Reorder  ?  ? ?Start the following medications: ?Meds ordered this encounter  ?Medications  ? ibuprofen (ADVIL) 200 MG tablet  ?  Sig: Take 1 tablet (200 mg total) by mouth every 8 (eight) hours as needed for moderate pain.  ?  Dispense:  90 tablet  ?  Refill:  0  ? albuterol (VENTOLIN HFA) 108 (90 Base) MCG/ACT inhaler  ?  Sig: Inhale 1-2 puffs into the lungs every 6 (six) hours as needed for wheezing or shortness of breath.  ?  Dispense:  6.7 g  ?  Refill:  0  ? hydrochlorothiazide (MICROZIDE) 12.5 MG capsule  ?  Sig: Take 1 capsule (12.5 mg total) by mouth daily.  ?  Dispense:  90 capsule  ?  Refill:  0  ?  ? ?Follow up: 3 months  ? ?Remember: Continue taking all your medications as directed.  Take ibuprofen in the morning and at night to help relieve your left thigh pain.  And you can use heating pad in the area as well.  Only use your albuterol inhaler if you are having wheezing or shortness of breath only. ? ?Should you have any questions or concerns please call the internal medicine clinic at [phone].   ? ?Timothy Lasso, MD ?[place] ?  ?

## 2022-03-26 NOTE — Progress Notes (Signed)
? ?CC: Left thigh pain, medication refill ? ?HPI: ? ?[name] is a 76 y.o. female with a past medical history stated below and presents today for CC listed above. Please see problem based assessment and plan for additional details. ? ?Past Medical History:  ?Diagnosis Date  ? Asthma   ? Cancer [facility])   ? GYN CA  ? Dermatomyositis ([place]) 05/05/2020  ? Essential hypertension 05/05/2020  ? GERD (gastroesophageal reflux disease)   ? GERD without esophagitis 05/05/2020  ? Hypercholesteremia   ? Hypertension   ? ? ?Current Outpatient Medications on File Prior to Visit  ?Medication Sig Dispense Refill  ? amLODipine (NORVASC) 5 MG tablet Take 1 tablet (5 mg total) by mouth daily. 90 tablet 1  ? diclofenac Sodium (VOLTAREN) 1 % GEL Apply 2 g topically 4 (four) times daily. 150 g 1  ? DULERA 200-5 MCG/ACT AERO Inhale 2 puffs into the lungs daily as needed for shortness of breath or wheezing.    ? etodolac (LODINE) 400 MG tablet Take 400 mg by mouth 2 (two) times daily.    ? ferrous sulfate 325 (65 FE) MG tablet Take 1 tablet (325 mg total) by mouth daily. 90 tablet 0  ? folic acid (FOLVITE) 1 MG tablet TAKE 1 TABLET(1 MG) BY MOUTH DAILY 90 tablet 1  ? melatonin 5 MG TABS Take by mouth.    ? Multiple Vitamin (MULTIVITAMIN) tablet Take 1 tablet by mouth daily.    ? mycophenolate (CELLCEPT) 500 MG tablet Take 1,000 mg by mouth 2 (two) times daily.    ? predniSONE (DELTASONE) 10 MG tablet TAKE 1 TABLET(10 MG) BY MOUTH DAILY 30 tablet 1  ? pregabalin (LYRICA) 150 MG capsule Take 1 capsule (150 mg total) by mouth in the morning, at noon, and at bedtime. 270 capsule 2  ? rosuvastatin (CRESTOR) 40 MG tablet Take 1 tablet (40 mg total) by mouth daily. 90 tablet 2  ? triamcinolone ointment (KENALOG) 0.5 % Apply 1 application topically 2 (two) times daily. 90 g 0  ? Zinc Oxide (DESITIN MAXIMUM STRENGTH EX) Apply 1 application topically daily as needed (rash prevention).    ? ?No current facility-administered medications on  file prior to visit.  ? ? ?Family History  ?Problem Relation Age of Onset  ? Breast cancer Mother   ? Cancer Daughter   ? ? ?Social History  ? ?Socioeconomic History  ? Marital status: Divorced  ?  Spouse name: Not on file  ? Number of children: Not on file  ? Years of education: Not on file  ? Highest education level: Not on file  ?Occupational History  ? Not on file  ?Tobacco Use  ? Smoking status: Never  ? Smokeless tobacco: Never  ?Substance and Sexual Activity  ? Alcohol use: Never  ? Drug use: Never  ? Sexual activity: Never  ?Other Topics Concern  ? Not on file  ?Social History Narrative  ? Current Social History 04/14/2021    ?   ? Patient lives with her son a home which is 1 story. There are no steps up to the entrance.  ?   ? Patient's method of transportation is personal car.  ?   ? The highest level of education was high school diploma.  ?   ? The patient currently retired.  ?   ? Identified important Relationships are "with my sons"   ?   ? Pets : None  ?    ? Interests / Fun:  "  Walking a little.  I use to sing in the choir and enjoyed that a lot".   ?   ? Current Stressors: "When they found the cancer, but I am better now".   ?   ? ?Social Determinants of Health  ? ?Financial Resource Strain: Not on file  ?Food Insecurity: Not on file  ?Transportation Needs: Not on file  ?Physical Activity: Not on file  ?Stress: Not on file  ?Social Connections: Not on file  ?Intimate Partner Violence: Not on file  ? ? ?Review of Systems: ?ROS negative except for what is noted on the assessment and plan. ? ?Vitals:  ? 03/26/22 1315  ?BP: 134/69  ?Pulse: 79  ?Temp: 98.2 ?F (36.8 ?C)  ?TempSrc: Oral  ?SpO2: 99%  ?Weight: 167 lb 8 oz (76 kg)  ?Height: '5\' 2"'$  (1.575 m)  ? ? ? ?Physical Exam: ?Constitutional: well-appearing obese elderly woman sitting in the chair, in no acute distress ?HENT: normocephalic atraumatic, mucous membranes moist ?Eyes: conjunctiva non-erythematous ?Neck: supple ?Cardiovascular: regular rate and  rhythm, no m/r/g ?Pulmonary/Chest: normal work of breathing on room air, lungs clear to auscultation bilaterally ?Abdominal: soft, non-tender, non-distended ?MSK: normal bulk and tone; tenderness to palpation of the left medial thigh.  No bruising or skin breakdown noted. ?Neurological: alert & oriented x 3, 5/5 strength in bilateral upper and lower extremities, normal gait ?Skin: warm and dry ?Psych: Normal mood and behavior ? ? ?Assessment & Plan:  ? ?See Encounters Tab for problem based charting. ? ?Patient discussed with Haley Johnson ?Timothy Lasso, M.D. ?[facility] Internal Medicine, PGY-1 ?Pager: [phone], Phone: [phone] ?Date 03/26/2022 Time 1:59 PM  ?

## 2022-03-26 NOTE — Assessment & Plan Note (Addendum)
Home medications include amlodipine 5 mg and HCTZ 12.5 mg daily. BP well controlled at 134/69 ?

## 2022-03-26 NOTE — Assessment & Plan Note (Addendum)
During last office visit patient complained of left thigh pain and was prescribed Voltaren gel for relief. She has been using the voltaren gel 2-3x a day with partial relief. Worse with walking long distance. Denies wearing tight clothing. She wears adult diapers, during the day, does not note any discomfort. Pain worse at night. Heating pads provide partial relieve.  Pain described as sharp-to-dull pain, that comes and go's. Massaging the area makes it feel better. Pain is localized to the medial side of the left thigh. Uses a walker and cane ot ambulate. NO recent falls. Denies any trauma or injury to the area.  On physical exam, mild tenderness to palpation of the left medial thigh.  Strength and sensation intact.  No bruising or skin breakdown noted.  Patient also has a history of dermatomyositis and follows rheumatology at [facility].  She currently takes prednisone 7.5 mg daily and receives IV immunoglobulins every 8 weeks.  Also she has a history of DDD with radiculopathy, given the dermatomal presentation of her left thigh pain, likely related to her DDD.  She states ibuprofen provides her with moderate relief and heating pad.  Recommend continuing conservative management. ? ? ?P: ?-Ibuprofen q8h for moderate pain ?-Continue to monitor ? ? ?

## 2022-03-26 NOTE — Assessment & Plan Note (Addendum)
Home medications include Lyrica 150 mg 3 times daily.  MRI of the lumbar spine obtained in 2021 revealed severe degenerative changes and central canal stenosis in the L1-2 and L4-5 and severe at L2-3, L3-4, L5-S1 foraminal narrowing. Lyrica medication helps relieve the pain. Has done physical therapy in the past, with moderate relief.  Denies any worsening of her symptoms at this time. ? ? ?P: ?-Continue Lyrica 150 mg 3 times daily ?

## 2022-03-27 DIAGNOSIS — M3313 Other dermatomyositis without myopathy: Secondary | ICD-10-CM | POA: Diagnosis not present

## 2022-03-27 DIAGNOSIS — I10 Essential (primary) hypertension: Secondary | ICD-10-CM | POA: Diagnosis not present

## 2022-03-27 DIAGNOSIS — M79606 Pain in leg, unspecified: Secondary | ICD-10-CM | POA: Diagnosis not present

## 2022-03-27 DIAGNOSIS — G893 Neoplasm related pain (acute) (chronic): Secondary | ICD-10-CM | POA: Diagnosis not present

## 2022-03-27 DIAGNOSIS — C579 Malignant neoplasm of female genital organ, unspecified: Secondary | ICD-10-CM | POA: Diagnosis not present

## 2022-03-27 DIAGNOSIS — Z5111 Encounter for antineoplastic chemotherapy: Secondary | ICD-10-CM | POA: Diagnosis not present

## 2022-03-27 DIAGNOSIS — E119 Type 2 diabetes mellitus without complications: Secondary | ICD-10-CM | POA: Diagnosis not present

## 2022-03-27 DIAGNOSIS — J45909 Unspecified asthma, uncomplicated: Secondary | ICD-10-CM | POA: Diagnosis not present

## 2022-03-27 DIAGNOSIS — C577 Malignant neoplasm of other specified female genital organs: Secondary | ICD-10-CM | POA: Diagnosis not present

## 2022-03-27 DIAGNOSIS — R5383 Other fatigue: Secondary | ICD-10-CM | POA: Diagnosis not present

## 2022-04-01 ENCOUNTER — Telehealth: Payer: Self-pay

## 2022-04-01 NOTE — Telephone Encounter (Signed)
Return call to pt's son who stated the Ibuprofen was decreased and pt is in a lot of pain. Requesting increase back to 600 mg or 800 mg as before. I asked about her pain ; he stated I can talk to the pt. But pt told her son, she's ok and the son told me to disregard this request. ?

## 2022-04-01 NOTE — Progress Notes (Signed)
Internal Medicine Clinic Attending ? ?Case discussed with Dr. Ariwodo  At the time of the visit.  We reviewed the resident?s history and exam and pertinent patient test results.  I agree with the assessment, diagnosis, and plan of care documented in the resident?s note.  ?

## 2022-04-01 NOTE — Telephone Encounter (Signed)
Pt's son requesting to speak with a nurse about ibuprofen (ADVIL) 200 MG tablet, states this medication is not helping her with pain. Please call back.  ?

## 2022-04-17 DIAGNOSIS — E119 Type 2 diabetes mellitus without complications: Secondary | ICD-10-CM | POA: Diagnosis not present

## 2022-04-17 DIAGNOSIS — Z5111 Encounter for antineoplastic chemotherapy: Secondary | ICD-10-CM | POA: Diagnosis not present

## 2022-04-17 DIAGNOSIS — C569 Malignant neoplasm of unspecified ovary: Secondary | ICD-10-CM | POA: Diagnosis not present

## 2022-04-17 DIAGNOSIS — Z7951 Long term (current) use of inhaled steroids: Secondary | ICD-10-CM | POA: Diagnosis not present

## 2022-04-17 DIAGNOSIS — Z90722 Acquired absence of ovaries, bilateral: Secondary | ICD-10-CM | POA: Diagnosis not present

## 2022-04-17 DIAGNOSIS — C579 Malignant neoplasm of female genital organ, unspecified: Secondary | ICD-10-CM | POA: Diagnosis not present

## 2022-04-17 DIAGNOSIS — Z9049 Acquired absence of other specified parts of digestive tract: Secondary | ICD-10-CM | POA: Diagnosis not present

## 2022-04-17 DIAGNOSIS — Z79899 Other long term (current) drug therapy: Secondary | ICD-10-CM | POA: Diagnosis not present

## 2022-04-17 DIAGNOSIS — M3390 Dermatopolymyositis, unspecified, organ involvement unspecified: Secondary | ICD-10-CM | POA: Diagnosis not present

## 2022-04-17 DIAGNOSIS — K6289 Other specified diseases of anus and rectum: Secondary | ICD-10-CM | POA: Diagnosis not present

## 2022-04-17 DIAGNOSIS — C577 Malignant neoplasm of other specified female genital organs: Secondary | ICD-10-CM | POA: Diagnosis not present

## 2022-04-17 DIAGNOSIS — Z7952 Long term (current) use of systemic steroids: Secondary | ICD-10-CM | POA: Diagnosis not present

## 2022-04-17 DIAGNOSIS — I10 Essential (primary) hypertension: Secondary | ICD-10-CM | POA: Diagnosis not present

## 2022-04-27 DIAGNOSIS — M3390 Dermatopolymyositis, unspecified, organ involvement unspecified: Secondary | ICD-10-CM | POA: Diagnosis not present

## 2022-04-30 DIAGNOSIS — M3390 Dermatopolymyositis, unspecified, organ involvement unspecified: Secondary | ICD-10-CM | POA: Diagnosis not present

## 2022-05-08 DIAGNOSIS — C579 Malignant neoplasm of female genital organ, unspecified: Secondary | ICD-10-CM | POA: Diagnosis not present

## 2022-05-08 DIAGNOSIS — C569 Malignant neoplasm of unspecified ovary: Secondary | ICD-10-CM | POA: Diagnosis not present

## 2022-05-08 DIAGNOSIS — D696 Thrombocytopenia, unspecified: Secondary | ICD-10-CM | POA: Diagnosis not present

## 2022-05-11 ENCOUNTER — Other Ambulatory Visit: Payer: Self-pay | Admitting: Obstetrics and Gynecology

## 2022-05-11 DIAGNOSIS — Z5111 Encounter for antineoplastic chemotherapy: Secondary | ICD-10-CM

## 2022-05-13 ENCOUNTER — Ambulatory Visit (INDEPENDENT_AMBULATORY_CARE_PROVIDER_SITE_OTHER): Payer: Medicare PPO | Admitting: Student

## 2022-05-13 DIAGNOSIS — Z5111 Encounter for antineoplastic chemotherapy: Secondary | ICD-10-CM

## 2022-05-13 LAB — CBC WITH DIFFERENTIAL/PLATELET
Abs Immature Granulocytes: 0.02 10*3/uL (ref 0.00–0.07)
Basophils Absolute: 0 10*3/uL (ref 0.0–0.1)
Basophils Relative: 0 %
Eosinophils Absolute: 0 10*3/uL (ref 0.0–0.5)
Eosinophils Relative: 0 %
HCT: 34.4 % — ABNORMAL LOW (ref 36.0–46.0)
Hemoglobin: 11.3 g/dL — ABNORMAL LOW (ref 12.0–15.0)
Immature Granulocytes: 0 %
Lymphocytes Relative: 21 %
Lymphs Abs: 1.5 10*3/uL (ref 0.7–4.0)
MCH: 34.2 pg — ABNORMAL HIGH (ref 26.0–34.0)
MCHC: 32.8 g/dL (ref 30.0–36.0)
MCV: 104.2 fL — ABNORMAL HIGH (ref 80.0–100.0)
Monocytes Absolute: 1.2 10*3/uL — ABNORMAL HIGH (ref 0.1–1.0)
Monocytes Relative: 17 %
Neutro Abs: 4.2 10*3/uL (ref 1.7–7.7)
Neutrophils Relative %: 62 %
Platelets: UNDETERMINED 10*3/uL (ref 150–400)
RBC: 3.3 MIL/uL — ABNORMAL LOW (ref 3.87–5.11)
RDW: 16 % — ABNORMAL HIGH (ref 11.5–15.5)
WBC: 6.9 10*3/uL (ref 4.0–10.5)
nRBC: 0 % (ref 0.0–0.2)

## 2022-05-13 LAB — PLATELET COUNT: Platelets: 83 10*3/uL — ABNORMAL LOW (ref 150–400)

## 2022-05-13 NOTE — Progress Notes (Signed)
Results faxed 05-13-2022 at 1144 to [facility] [phone]  attn: Dr Mellody Drown  I also called [facility] [phone], notified her results should be available in [place] and copy faxed to above number.   Maryan Rued, PBT [facility]

## 2022-05-13 NOTE — Progress Notes (Signed)
Lab only visit. No charge.

## 2022-05-18 ENCOUNTER — Other Ambulatory Visit: Payer: Self-pay | Admitting: Obstetrics and Gynecology

## 2022-05-18 DIAGNOSIS — Z5111 Encounter for antineoplastic chemotherapy: Secondary | ICD-10-CM

## 2022-05-20 ENCOUNTER — Other Ambulatory Visit (INDEPENDENT_AMBULATORY_CARE_PROVIDER_SITE_OTHER): Payer: Medicare PPO

## 2022-05-20 DIAGNOSIS — Z5111 Encounter for antineoplastic chemotherapy: Secondary | ICD-10-CM

## 2022-05-20 LAB — CBC WITH DIFFERENTIAL/PLATELET
Abs Immature Granulocytes: 0.03 10*3/uL (ref 0.00–0.07)
Basophils Absolute: 0 10*3/uL (ref 0.0–0.1)
Basophils Relative: 0 %
Eosinophils Absolute: 0 10*3/uL (ref 0.0–0.5)
Eosinophils Relative: 0 %
HCT: 32.6 % — ABNORMAL LOW (ref 36.0–46.0)
Hemoglobin: 10.9 g/dL — ABNORMAL LOW (ref 12.0–15.0)
Immature Granulocytes: 0 %
Lymphocytes Relative: 7 %
Lymphs Abs: 0.8 10*3/uL (ref 0.7–4.0)
MCH: 34.9 pg — ABNORMAL HIGH (ref 26.0–34.0)
MCHC: 33.4 g/dL (ref 30.0–36.0)
MCV: 104.5 fL — ABNORMAL HIGH (ref 80.0–100.0)
Monocytes Absolute: 1.1 10*3/uL — ABNORMAL HIGH (ref 0.1–1.0)
Monocytes Relative: 9 %
Neutro Abs: 10 10*3/uL — ABNORMAL HIGH (ref 1.7–7.7)
Neutrophils Relative %: 84 %
Platelets: 117 10*3/uL — ABNORMAL LOW (ref 150–400)
RBC: 3.12 MIL/uL — ABNORMAL LOW (ref 3.87–5.11)
RDW: 15.3 % (ref 11.5–15.5)
WBC: 11.9 10*3/uL — ABNORMAL HIGH (ref 4.0–10.5)
nRBC: 0 % (ref 0.0–0.2)

## 2022-05-20 NOTE — Progress Notes (Signed)
05-20-2022 9211  Copy of STAT CBC,Diff results faxed to [facility], Division of Gynecologic Oncology, at [phone], Attn: Dr Mellody Drown.   Maryan Rued, [facility] 05-20-2022 [phone]

## 2022-05-21 DIAGNOSIS — C579 Malignant neoplasm of female genital organ, unspecified: Secondary | ICD-10-CM | POA: Diagnosis not present

## 2022-05-21 DIAGNOSIS — Z5111 Encounter for antineoplastic chemotherapy: Secondary | ICD-10-CM | POA: Diagnosis not present

## 2022-06-17 ENCOUNTER — Other Ambulatory Visit: Payer: Self-pay | Admitting: Physician Assistant

## 2022-06-17 ENCOUNTER — Other Ambulatory Visit: Payer: Medicare PPO

## 2022-06-17 DIAGNOSIS — C579 Malignant neoplasm of female genital organ, unspecified: Secondary | ICD-10-CM

## 2022-06-17 DIAGNOSIS — Z5111 Encounter for antineoplastic chemotherapy: Secondary | ICD-10-CM

## 2022-06-18 ENCOUNTER — Other Ambulatory Visit (INDEPENDENT_AMBULATORY_CARE_PROVIDER_SITE_OTHER): Payer: Medicare PPO

## 2022-06-18 DIAGNOSIS — Z5111 Encounter for antineoplastic chemotherapy: Secondary | ICD-10-CM | POA: Diagnosis not present

## 2022-06-18 DIAGNOSIS — C579 Malignant neoplasm of female genital organ, unspecified: Secondary | ICD-10-CM

## 2022-06-18 LAB — CBC WITH DIFFERENTIAL/PLATELET
Abs Immature Granulocytes: 0.03 10*3/uL (ref 0.00–0.07)
Basophils Absolute: 0 10*3/uL (ref 0.0–0.1)
Basophils Relative: 0 %
Eosinophils Absolute: 0 10*3/uL (ref 0.0–0.5)
Eosinophils Relative: 0 %
HCT: 34.2 % — ABNORMAL LOW (ref 36.0–46.0)
Hemoglobin: 11.2 g/dL — ABNORMAL LOW (ref 12.0–15.0)
Immature Granulocytes: 0 %
Lymphocytes Relative: 10 %
Lymphs Abs: 1 10*3/uL (ref 0.7–4.0)
MCH: 35.3 pg — ABNORMAL HIGH (ref 26.0–34.0)
MCHC: 32.7 g/dL (ref 30.0–36.0)
MCV: 107.9 fL — ABNORMAL HIGH (ref 80.0–100.0)
Monocytes Absolute: 0.6 10*3/uL (ref 0.1–1.0)
Monocytes Relative: 6 %
Neutro Abs: 8.6 10*3/uL — ABNORMAL HIGH (ref 1.7–7.7)
Neutrophils Relative %: 84 %
Platelets: 154 10*3/uL (ref 150–400)
RBC: 3.17 MIL/uL — ABNORMAL LOW (ref 3.87–5.11)
RDW: 14 % (ref 11.5–15.5)
WBC: 10.2 10*3/uL (ref 4.0–10.5)
nRBC: 0 % (ref 0.0–0.2)

## 2022-06-18 LAB — COMPREHENSIVE METABOLIC PANEL
ALT: 24 U/L (ref 0–44)
AST: 26 U/L (ref 15–41)
Albumin: 3.4 g/dL — ABNORMAL LOW (ref 3.5–5.0)
Alkaline Phosphatase: 72 U/L (ref 38–126)
Anion gap: 6 (ref 5–15)
BUN: 14 mg/dL (ref 8–23)
CO2: 27 mmol/L (ref 22–32)
Calcium: 9 mg/dL (ref 8.9–10.3)
Chloride: 105 mmol/L (ref 98–111)
Creatinine, Ser: 0.76 mg/dL (ref 0.44–1.00)
GFR, Estimated: >60 mL/min (ref >60)
Glucose, Bld: 131 mg/dL — ABNORMAL HIGH (ref 70–99)
Potassium: 4.2 mmol/L (ref 3.5–5.1)
Sodium: 138 mmol/L (ref 135–145)
Total Bilirubin: 0.4 mg/dL (ref 0.3–1.2)
Total Protein: 6.3 g/dL — ABNORMAL LOW (ref 6.5–8.1)

## 2022-06-18 NOTE — Progress Notes (Signed)
Results faxed to [facility], [facility], At 15:30 06-18-2022   Maryan Rued, PBT 06-18-2022 [phone]

## 2022-06-19 DIAGNOSIS — Z1501 Genetic susceptibility to malignant neoplasm of breast: Secondary | ICD-10-CM | POA: Diagnosis not present

## 2022-06-19 DIAGNOSIS — Z79633 Long term (current) use of mitotic inhibitor: Secondary | ICD-10-CM | POA: Diagnosis not present

## 2022-06-19 DIAGNOSIS — C763 Malignant neoplasm of pelvis: Secondary | ICD-10-CM | POA: Diagnosis not present

## 2022-06-19 DIAGNOSIS — I10 Essential (primary) hypertension: Secondary | ICD-10-CM | POA: Diagnosis not present

## 2022-06-19 DIAGNOSIS — M3313 Other dermatomyositis without myopathy: Secondary | ICD-10-CM | POA: Diagnosis not present

## 2022-06-19 DIAGNOSIS — Z5111 Encounter for antineoplastic chemotherapy: Secondary | ICD-10-CM | POA: Diagnosis not present

## 2022-06-19 DIAGNOSIS — C55 Malignant neoplasm of uterus, part unspecified: Secondary | ICD-10-CM | POA: Diagnosis not present

## 2022-06-19 DIAGNOSIS — Z79630 Long term (current) use of alkylating agent: Secondary | ICD-10-CM | POA: Diagnosis not present

## 2022-06-19 DIAGNOSIS — J45909 Unspecified asthma, uncomplicated: Secondary | ICD-10-CM | POA: Diagnosis not present

## 2022-06-19 DIAGNOSIS — E119 Type 2 diabetes mellitus without complications: Secondary | ICD-10-CM | POA: Diagnosis not present

## 2022-06-20 ENCOUNTER — Other Ambulatory Visit: Payer: Self-pay | Admitting: Internal Medicine

## 2022-06-24 ENCOUNTER — Other Ambulatory Visit: Payer: Self-pay | Admitting: Internal Medicine

## 2022-06-27 DIAGNOSIS — M3390 Dermatopolymyositis, unspecified, organ involvement unspecified: Secondary | ICD-10-CM | POA: Diagnosis not present

## 2022-06-29 ENCOUNTER — Ambulatory Visit: Payer: Self-pay

## 2022-06-29 NOTE — Patient Outreach (Signed)
  Care Coordination   Initial Visit Note   06/29/2022 Name: Haley Johnson MRN: [id number] DOB: 1946-04-05  Haley Johnson is a 76 y.o. year old female who sees Timothy Lasso, MD (Inactive) for primary care. I spoke with  Radene Journey by phone today.  What matters to the patients health and wellness today?  Discussed patient's status with son and emergency contact Haley Johnson.  He states his mom is doing as well as can be expected as she is having chemo at [facility] for Uterine cancer.  Patient has good family support and support at the cancer center.  Advised to call if [facility] can be of any assistance.   Goals Addressed   None     SDOH assessments and interventions completed:  Yes  SDOH Interventions Today    Flowsheet Row Most Recent Value  SDOH Interventions   Stress Interventions Other (Comment)  [related to cancer diagnosis]  Transportation Interventions Intervention Not Indicated        Care Coordination Interventions Activated:  No  Care Coordination Interventions:  No, not indicated   Follow up plan: No further intervention required.   Encounter Outcome:  Pt. Visit Completed

## 2022-07-03 DIAGNOSIS — Z1231 Encounter for screening mammogram for malignant neoplasm of breast: Secondary | ICD-10-CM | POA: Diagnosis not present

## 2022-07-12 IMAGING — MR MR LUMBAR SPINE W/O CM
4 of 5 series · 18 of 48 positions shown · non-contrast
Comparison: None.

CLINICAL DATA: Lumbosacral osteoarthritis.

EXAM:
MRI LUMBAR SPINE WITHOUT CONTRAST
TECHNIQUE: Multiplanar, multisequence MR imaging of the lumbar spine was
performed. No intravenous contrast was administered.

[Series 2: T2 · sagittal · 4.0mm · 0.47mm/px · 8 of 20 slices shown (1 of 2)]
[im 1/20]
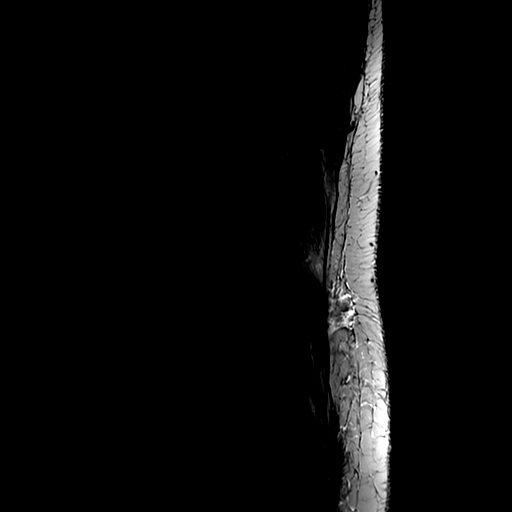
[im 3/20]
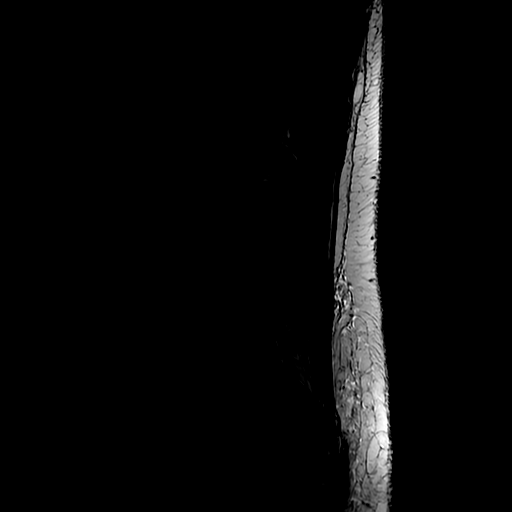
[im 6/20]
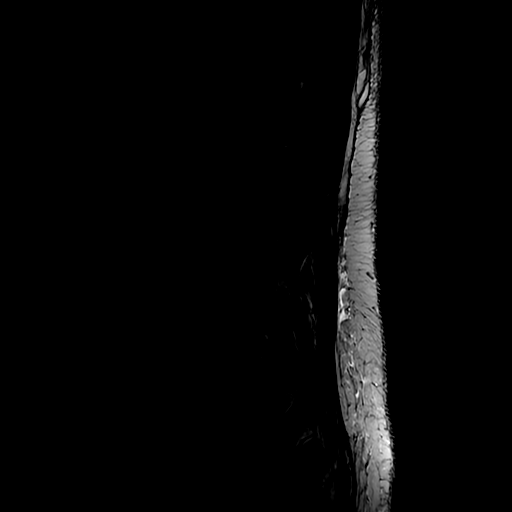
[im 9/20]
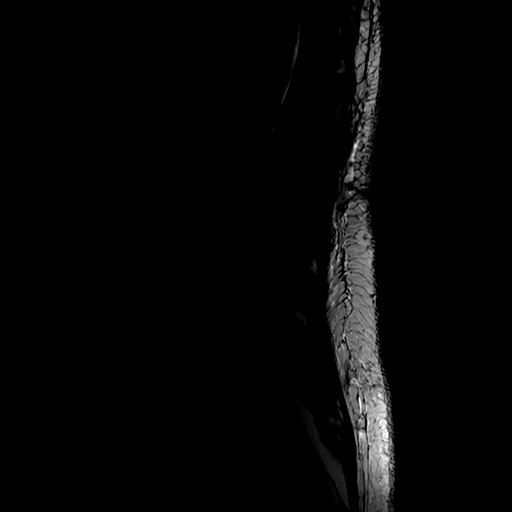
[im 11/20]
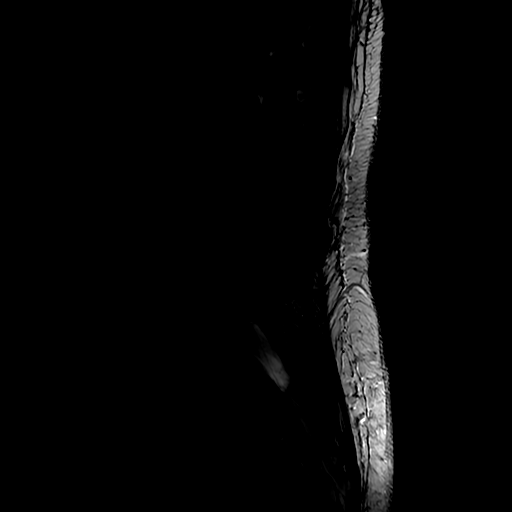
[im 14/20]
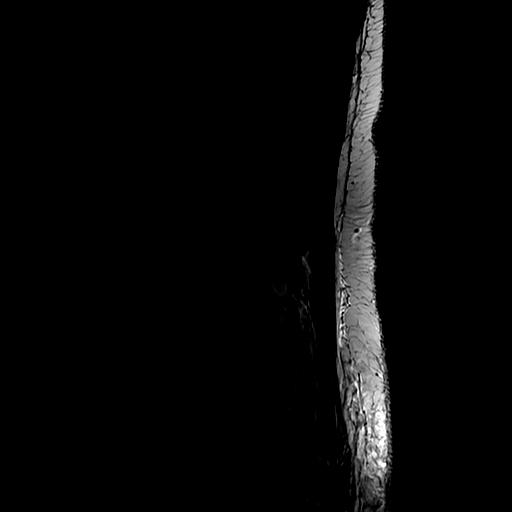
[im 17/20]
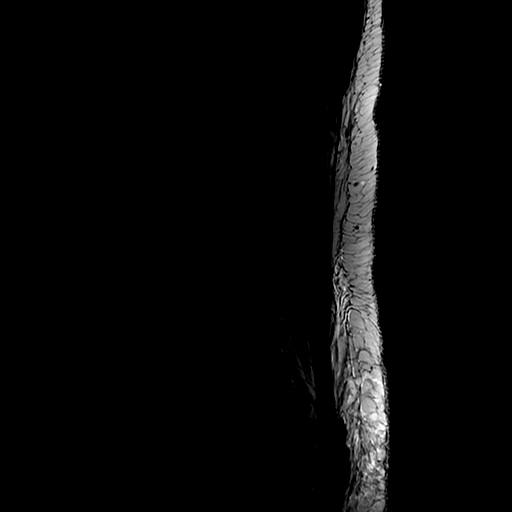
[im 20/20]
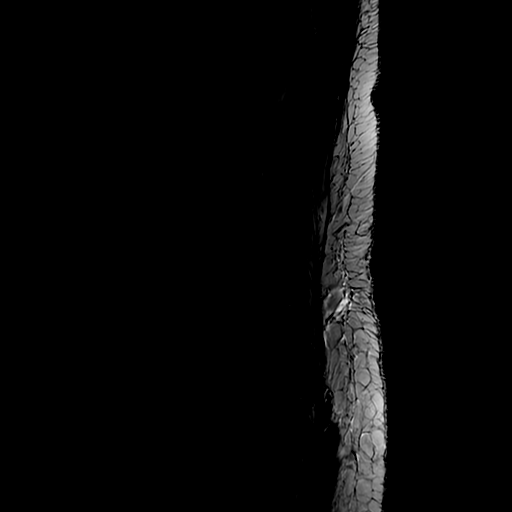

[Series 4: T1 · sagittal · 4.0mm · 0.47mm/px · 3 of 20 slices shown (1 of 2)]
[im 4/20]
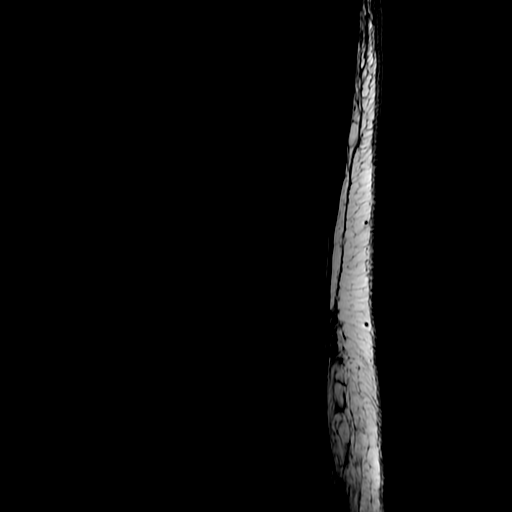
[im 10/20]
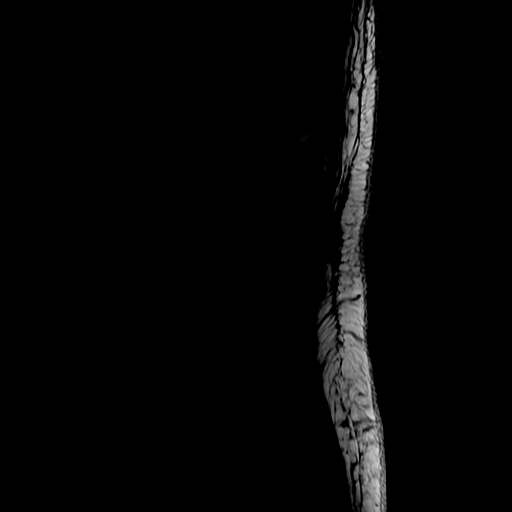
[im 16/20]
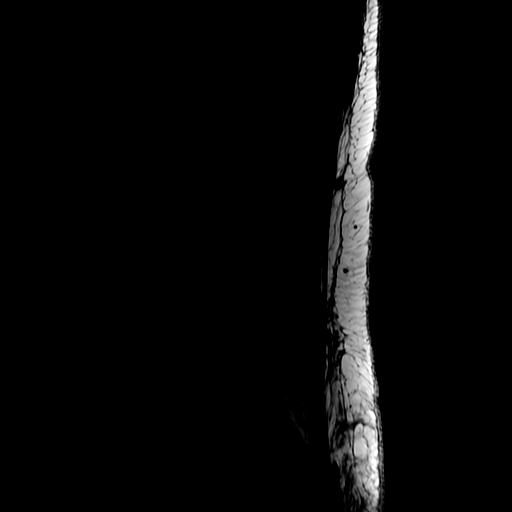

[Series 5: T2 · axial · 4.0mm · 0.39mm/px · z∈[+1,+150]mm · 4 of 37 slices shown (2 of 2)]
[im 1/37]
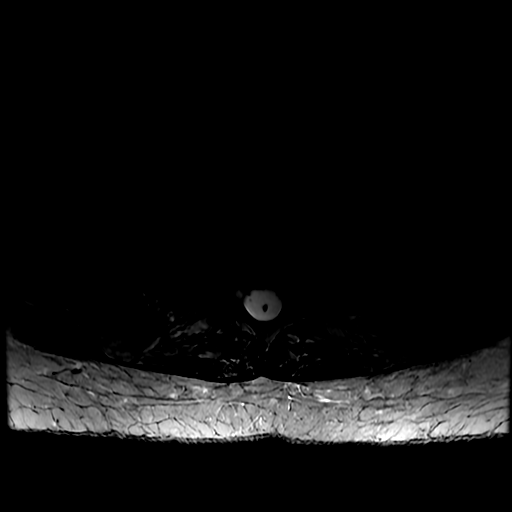
[im 7/37]
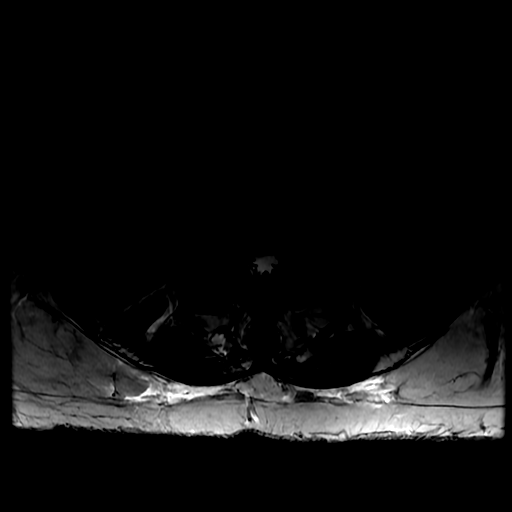
[im 19/37]
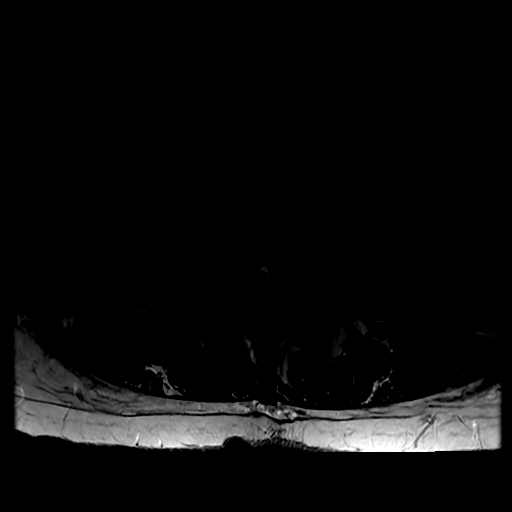
[im 31/37]
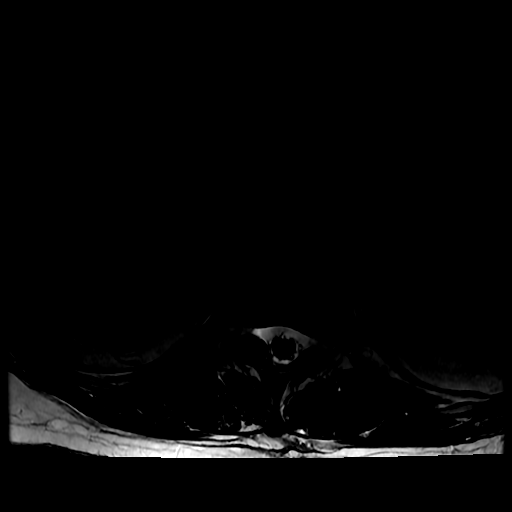

[Series 6: T1 · axial · 4.0mm · 0.39mm/px · z∈[+31,+150]mm · 3 of 37 slices shown (2 of 2)]
[im 7/37]
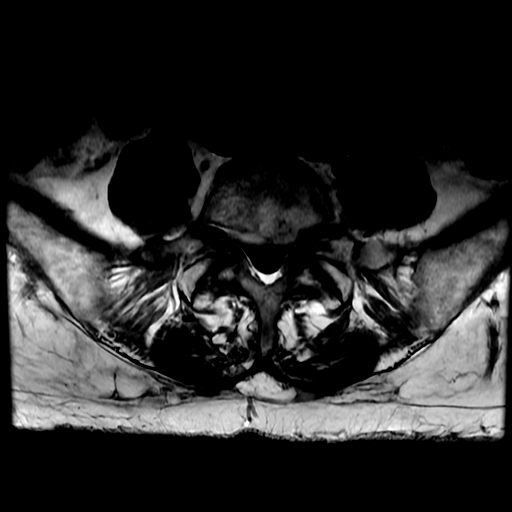
[im 19/37]
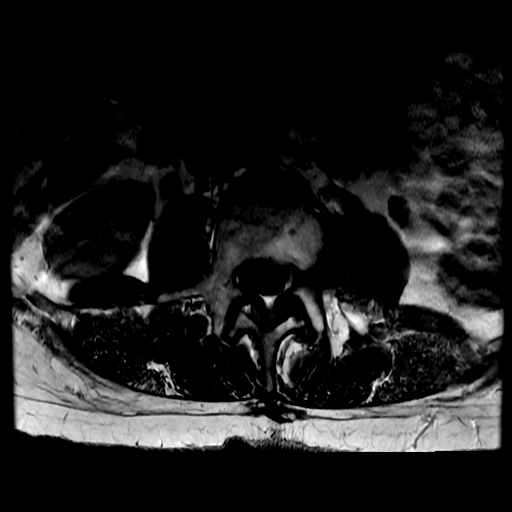
[im 31/37]
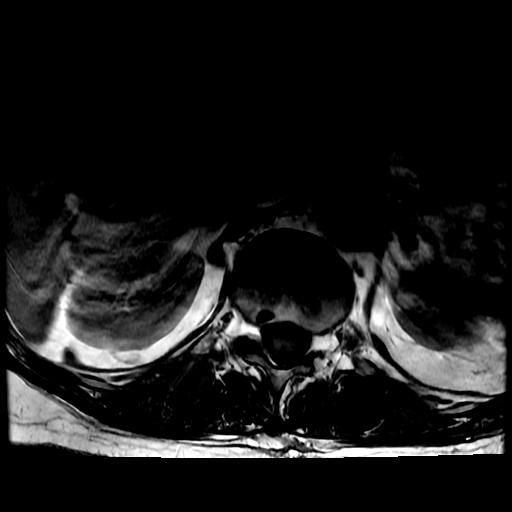

[18 of 48 positions shown; findings below may reference images not displayed]

FINDINGS: Segmentation:  5 lumbar type vertebrae

Alignment:  Mild scoliosis.

Vertebrae:  No fracture, evidence of discitis, or bone lesion.

Conus medullaris and cauda equina: Conus extends to the L2 level.
Conus appears normal. Cord T2 hyperintensity at T11-12 where there
is flattening from degeneration. Cauda equina redundancy due to the
degree of severe spinal stenosis.

Paraspinal and other soft tissues: Negative for perispinal mass or
inflammation.

Disc levels:

T11-12: Disc collapse with central herniation and dorsal epidural
fat expansion causing spinal stenosis with mild cord wasting. T2
hyperintensity in the cord that is mild. Biforaminal impingement.

T12- L1: Disc narrowing and ventral spondylitic spurring. Mild facet
spurring

L1-L2: Disc collapse and endplate degeneration. Bilateral facet
spurring. Moderate right foraminal narrowing

L2-L3: Disc collapse with biforaminal bulging and endplate
degeneration. Bilateral facet spurring. Compressive spinal stenosis.
Biforaminal compression.

L3-L4: Disc collapse with circumferential bulging. Advanced facet
osteoarthritis with ligamentum flavum thickening. Bilateral facet
spurring. Severe biforaminal impingement, especially on the left

L4-L5: Disc narrowing and bulging with endplate degeneration and
right foraminal protrusion. Bilateral facet spurring. Right
foraminal impingement. Mild spinal stenosis

L5-S1:Disc collapse and endplate degeneration with bulging and
ridging. Bilateral facet spurring. Biforaminal impingement that is
moderate.
IMPRESSION: 1. Advanced and generalized lumbar spine degeneration with mild
scoliosis.
2. T11-12 spinal stenosis with cord deformity and mild cord signal
abnormality.
3. Advanced spinal and foraminal stenosis at L2-3 and L3-4.
4. L4-5 advanced right foraminal impingement.
5. L5-S1 bilateral foraminal impingement.

## 2022-07-17 DIAGNOSIS — I10 Essential (primary) hypertension: Secondary | ICD-10-CM | POA: Diagnosis not present

## 2022-07-17 DIAGNOSIS — E119 Type 2 diabetes mellitus without complications: Secondary | ICD-10-CM | POA: Diagnosis not present

## 2022-07-17 DIAGNOSIS — Z1501 Genetic susceptibility to malignant neoplasm of breast: Secondary | ICD-10-CM | POA: Diagnosis not present

## 2022-07-17 DIAGNOSIS — J45909 Unspecified asthma, uncomplicated: Secondary | ICD-10-CM | POA: Diagnosis not present

## 2022-07-17 DIAGNOSIS — K6289 Other specified diseases of anus and rectum: Secondary | ICD-10-CM | POA: Diagnosis not present

## 2022-07-17 DIAGNOSIS — C569 Malignant neoplasm of unspecified ovary: Secondary | ICD-10-CM | POA: Diagnosis not present

## 2022-07-17 DIAGNOSIS — Z1509 Genetic susceptibility to other malignant neoplasm: Secondary | ICD-10-CM | POA: Diagnosis not present

## 2022-07-17 DIAGNOSIS — C785 Secondary malignant neoplasm of large intestine and rectum: Secondary | ICD-10-CM | POA: Diagnosis not present

## 2022-07-17 DIAGNOSIS — C577 Malignant neoplasm of other specified female genital organs: Secondary | ICD-10-CM | POA: Diagnosis not present

## 2022-08-03 ENCOUNTER — Other Ambulatory Visit: Payer: Medicare PPO

## 2022-08-04 ENCOUNTER — Telehealth: Payer: Self-pay | Admitting: *Deleted

## 2022-08-04 ENCOUNTER — Other Ambulatory Visit: Payer: Self-pay | Admitting: Physician Assistant

## 2022-08-04 ENCOUNTER — Other Ambulatory Visit (INDEPENDENT_AMBULATORY_CARE_PROVIDER_SITE_OTHER): Payer: Medicare PPO

## 2022-08-04 DIAGNOSIS — C569 Malignant neoplasm of unspecified ovary: Secondary | ICD-10-CM | POA: Diagnosis not present

## 2022-08-04 LAB — CBC WITH DIFFERENTIAL/PLATELET
Abs Immature Granulocytes: 0.03 10*3/uL (ref 0.00–0.07)
Basophils Absolute: 0 10*3/uL (ref 0.0–0.1)
Basophils Relative: 0 %
Eosinophils Absolute: 0 10*3/uL (ref 0.0–0.5)
Eosinophils Relative: 0 %
HCT: 34.5 % — ABNORMAL LOW (ref 36.0–46.0)
Hemoglobin: 11.1 g/dL — ABNORMAL LOW (ref 12.0–15.0)
Immature Granulocytes: 0 %
Lymphocytes Relative: 10 %
Lymphs Abs: 1.1 10*3/uL (ref 0.7–4.0)
MCH: 33.9 pg (ref 26.0–34.0)
MCHC: 32.2 g/dL (ref 30.0–36.0)
MCV: 105.5 fL — ABNORMAL HIGH (ref 80.0–100.0)
Monocytes Absolute: 1 10*3/uL (ref 0.1–1.0)
Monocytes Relative: 9 %
Neutro Abs: 8.6 10*3/uL — ABNORMAL HIGH (ref 1.7–7.7)
Neutrophils Relative %: 81 %
Platelets: 173 10*3/uL (ref 150–400)
RBC: 3.27 MIL/uL — ABNORMAL LOW (ref 3.87–5.11)
RDW: 12.8 % (ref 11.5–15.5)
WBC: 10.8 10*3/uL — ABNORMAL HIGH (ref 4.0–10.5)
nRBC: 0 % (ref 0.0–0.2)

## 2022-08-04 LAB — COMPREHENSIVE METABOLIC PANEL
ALT: 29 U/L (ref 0–44)
AST: 31 U/L (ref 15–41)
Albumin: 3 g/dL — ABNORMAL LOW (ref 3.5–5.0)
Alkaline Phosphatase: 55 U/L (ref 38–126)
Anion gap: 7 (ref 5–15)
BUN: 13 mg/dL (ref 8–23)
CO2: 27 mmol/L (ref 22–32)
Calcium: 9.3 mg/dL (ref 8.9–10.3)
Chloride: 106 mmol/L (ref 98–111)
Creatinine, Ser: 0.67 mg/dL (ref 0.44–1.00)
GFR, Estimated: >60 mL/min (ref >60)
Glucose, Bld: 118 mg/dL — ABNORMAL HIGH (ref 70–99)
Potassium: 3.8 mmol/L (ref 3.5–5.1)
Sodium: 140 mmol/L (ref 135–145)
Total Bilirubin: 0.7 mg/dL (ref 0.3–1.2)
Total Protein: 6.4 g/dL — ABNORMAL LOW (ref 6.5–8.1)

## 2022-08-04 NOTE — Telephone Encounter (Signed)
08-04-2022  14:34  Faxed preliminary lab results from 08-04-2022 to [facility], Attn: Alinda Dooms [place], [place]., at  [phone]  CBC, [place] and CMP Final CA 125 pending  Maryan Rued, PBT [facility] 08-04-2022 [phone]

## 2022-08-05 ENCOUNTER — Telehealth: Payer: Self-pay | Admitting: *Deleted

## 2022-08-05 ENCOUNTER — Other Ambulatory Visit: Payer: Self-pay | Admitting: Internal Medicine

## 2022-08-05 LAB — CA 125: Cancer Antigen (CA) 125: 17.4 U/mL (ref 0.0–38.1)

## 2022-08-05 NOTE — Telephone Encounter (Signed)
08-05-2022  [phone]  Faxed Final Lab Report for 08-04-2022 collection (CBC DIFF, CMP, CA 125) to [facility] at [phone], attn:  Rosamaria Lints, [place], PBT [facility] [phone] 08-05-2022

## 2022-08-10 NOTE — Telephone Encounter (Signed)
Next appt scheduled 11/2 with PCP.

## 2022-08-10 NOTE — Telephone Encounter (Signed)
amLODipine (NORVASC) 5 MG tablet (Expired), refill request @ [id number] - [place], [place].

## 2022-08-13 DIAGNOSIS — M5440 Lumbago with sciatica, unspecified side: Secondary | ICD-10-CM | POA: Diagnosis not present

## 2022-08-13 DIAGNOSIS — C579 Malignant neoplasm of female genital organ, unspecified: Secondary | ICD-10-CM | POA: Diagnosis not present

## 2022-08-13 DIAGNOSIS — M3390 Dermatopolymyositis, unspecified, organ involvement unspecified: Secondary | ICD-10-CM | POA: Diagnosis not present

## 2022-08-13 DIAGNOSIS — Z7952 Long term (current) use of systemic steroids: Secondary | ICD-10-CM | POA: Diagnosis not present

## 2022-08-18 DIAGNOSIS — M3390 Dermatopolymyositis, unspecified, organ involvement unspecified: Secondary | ICD-10-CM | POA: Diagnosis not present

## 2022-08-28 DIAGNOSIS — J45909 Unspecified asthma, uncomplicated: Secondary | ICD-10-CM | POA: Diagnosis not present

## 2022-08-28 DIAGNOSIS — C579 Malignant neoplasm of female genital organ, unspecified: Secondary | ICD-10-CM | POA: Diagnosis not present

## 2022-08-28 DIAGNOSIS — E119 Type 2 diabetes mellitus without complications: Secondary | ICD-10-CM | POA: Diagnosis not present

## 2022-08-28 DIAGNOSIS — C577 Malignant neoplasm of other specified female genital organs: Secondary | ICD-10-CM | POA: Diagnosis not present

## 2022-08-28 DIAGNOSIS — Z23 Encounter for immunization: Secondary | ICD-10-CM | POA: Diagnosis not present

## 2022-08-28 DIAGNOSIS — I10 Essential (primary) hypertension: Secondary | ICD-10-CM | POA: Diagnosis not present

## 2022-08-28 DIAGNOSIS — M3313 Other dermatomyositis without myopathy: Secondary | ICD-10-CM | POA: Diagnosis not present

## 2022-09-21 ENCOUNTER — Other Ambulatory Visit: Payer: Self-pay | Admitting: Internal Medicine

## 2022-09-22 NOTE — Telephone Encounter (Signed)
Next appt scheduled 09/24/22 with PCP.

## 2022-09-23 ENCOUNTER — Encounter: Payer: Self-pay | Admitting: Internal Medicine

## 2022-09-23 DIAGNOSIS — Z7952 Long term (current) use of systemic steroids: Secondary | ICD-10-CM | POA: Insufficient documentation

## 2022-09-23 NOTE — Progress Notes (Signed)
76 yo Ms. Pool and I are meeting for the first time - she has been very occupied with cancer treatment at [facility] and is largely recovered, ready now to focus on health maintenance and prevention.  She reports that her appetite and weight have improved, and that she no longer has problems with swallowing except for her ongoing edentulous maxilla which she manages. She continues treatment for her dermatomyositis every 2 months at [facility] as well.  [facility] has also referred her to a spine specialist in their system (first appt next week) and she has been started on Tramadol which has been ineffective.  Taking ibuprofen, two in the mornings, may take another later in the day.  Doesn't like to take too many; also takes tylenol, takes 2, may not repeat.  The liquids (rather than tabs/caps) seem to work better.  Takes Lyrica, helps, but the liquid seems again to work better than tabs/caps.    Exam: BP 126/62 (BP Location: Right Arm, Patient Position: Sitting, Cuff Size: Small)   Pulse 62   Temp 98.2 F (36.8 C) (Oral)   Ht '5\' 2"'$  (1.575 m)   Wt 163 lb 11.2 oz (74.3 kg)   SpO2 100%   BMI 29.94 kg/m  Nicely dressed and groomed, with a well appearance and friendly positive energy.   Mild swelling dorsal right foot; dp 2+ R, 1+ L, anterior tibial scars of former ulcers, feet in good repair, L infraorbital thickening (scar from eyelid surgery)  Assessment and Plan:  Having reached cancer remission, she is doing very well and we discussed plans to catch up on her preventative health care.  She wishes to be as active and healthy as she can be.  Traveling to [facility] for her cancer f/u is desirable, though she doesn't wish to be referred to additional specialists I their system as she lives in [place].  She doesn't plan to go to the spine specialist next week.    Post herpetic neuralgia Sharp, burning, stabbing pain in the skin distribution of her prior shingles R hip.  Lyrica liquid helps more than pills.  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  Continues daily prednisone currently 7.5 mg managed by [facility].  Symptoms well managed.  DEXA UTD at [facility] 2022; osteopenic in some areas, normal in others.  Vit D level this visit.    Mixed hyperlipidemia Due for lipid eval this visit.    Healthcare maintenance UTD on colonoscopy, mammography, DEXA, vaccines (except Shingrix).  Received flu shot this visit.

## 2022-09-24 ENCOUNTER — Ambulatory Visit (INDEPENDENT_AMBULATORY_CARE_PROVIDER_SITE_OTHER): Payer: Medicare PPO | Admitting: Internal Medicine

## 2022-09-24 ENCOUNTER — Encounter: Payer: Self-pay | Admitting: Internal Medicine

## 2022-09-24 ENCOUNTER — Ambulatory Visit (INDEPENDENT_AMBULATORY_CARE_PROVIDER_SITE_OTHER): Payer: Medicare PPO

## 2022-09-24 VITALS — BP 126/62 | HR 62 | Temp 98.2°F | Ht 62.0 in | Wt 163.7 lb

## 2022-09-24 DIAGNOSIS — E782 Mixed hyperlipidemia: Secondary | ICD-10-CM | POA: Diagnosis not present

## 2022-09-24 DIAGNOSIS — Z0000 Encounter for general adult medical examination without abnormal findings: Secondary | ICD-10-CM | POA: Diagnosis not present

## 2022-09-24 DIAGNOSIS — Z7952 Long term (current) use of systemic steroids: Secondary | ICD-10-CM

## 2022-09-24 DIAGNOSIS — B0229 Other postherpetic nervous system involvement: Secondary | ICD-10-CM

## 2022-09-24 DIAGNOSIS — C579 Malignant neoplasm of female genital organ, unspecified: Secondary | ICD-10-CM

## 2022-09-24 DIAGNOSIS — M3390 Dermatopolymyositis, unspecified, organ involvement unspecified: Secondary | ICD-10-CM | POA: Diagnosis not present

## 2022-09-24 DIAGNOSIS — E43 Unspecified severe protein-calorie malnutrition: Secondary | ICD-10-CM

## 2022-09-24 DIAGNOSIS — Z23 Encounter for immunization: Secondary | ICD-10-CM

## 2022-09-24 NOTE — Assessment & Plan Note (Signed)
Sharp, burning, stabbing pain in the skin distribution of her prior shingles R hip.  Lyrica liquid helps more than pills.

## 2022-09-24 NOTE — Progress Notes (Signed)
Subjective:   Haley Johnson is a 76 y.o. female who presents for an Initial Medicare Annual Wellness Visit. I connected with  Eber Jones on 09/24/22 by a  IN PERSON [place]    Patient Location: Other:  [place]   Provider Location: Office/Clinic  I discussed the limitations of evaluation and management by telemedicine. The patient expressed understanding and agreed to proceed.  Review of Systems    DEFERRED TO PCP  Cardiac Risk Factors include: advanced age (>70mn, >>66women)     Objective:    Today's Vitals   09/24/22 1011  PainSc: 5    There is no height or weight on file to calculate BMI.     09/24/2022   10:16 AM 03/26/2022    1:14 PM 09/12/2021   10:58 AM 08/13/2021    2:00 PM 06/03/2021   12:09 PM 04/14/2021    3:51 PM 07/15/2020    5:20 PM  Advanced Directives  Does Patient Have a Medical Advance Directive? No No No No No No No  Would patient like information on creating a medical advance directive? No - Patient declined No - Patient declined No - Patient declined No - Patient declined No - Patient declined Yes (MAU/Ambulatory/Procedural Areas - Information given) No - Patient declined    Current Medications (verified) Outpatient Encounter Medications as of 09/24/2022  Medication Sig   amLODipine (NORVASC) 5 MG tablet TAKE 1 TABLET(5 MG) BY MOUTH DAILY   diclofenac Sodium (VOLTAREN) 1 % GEL Apply 2 g topically 4 (four) times daily.   DULERA 200-5 MCG/ACT AERO Inhale 2 puffs into the lungs daily as needed for shortness of breath or wheezing.   FEROSUL 325 (65 Fe) MG tablet TAKE 1 TABLET(325 MG) BY MOUTH DAILY   folic acid (FOLVITE) 1 MG tablet TAKE 1 TABLET(1 MG) BY MOUTH DAILY   hydrochlorothiazide (MICROZIDE) 12.5 MG capsule TAKE 1 CAPSULE(12.5 MG) BY MOUTH DAILY   Multiple Vitamin (MULTIVITAMIN) tablet Take 1 tablet by mouth daily.   predniSONE (DELTASONE) 5 MG tablet Take 1.5 tablets (7.5 mg total) by mouth daily with breakfast.   Zinc  Oxide (DESITIN MAXIMUM STRENGTH EX) Apply 1 application topically daily as needed (rash prevention).   pregabalin (LYRICA) 150 MG capsule Take 1 capsule (150 mg total) by mouth in the morning, at noon, and at bedtime.   rosuvastatin (CRESTOR) 40 MG tablet Take 1 tablet (40 mg total) by mouth daily.   [DISCONTINUED] albuterol (VENTOLIN HFA) 108 (90 Base) MCG/ACT inhaler Inhale 1-2 puffs into the lungs every 6 (six) hours as needed for wheezing or shortness of breath.   [DISCONTINUED] etodolac (LODINE) 400 MG tablet Take 400 mg by mouth 2 (two) times daily.   [DISCONTINUED] melatonin 5 MG TABS Take by mouth.   [DISCONTINUED] mycophenolate (CELLCEPT) 500 MG tablet Take 1,000 mg by mouth 2 (two) times daily.   [DISCONTINUED] triamcinolone ointment (KENALOG) 0.5 % Apply 1 application topically 2 (two) times daily.   No facility-administered encounter medications on file as of 09/24/2022.    Allergies (verified) Chocolate, Cocoa, Blueberry flavor, and Tomato   History: Past Medical History:  Diagnosis Date   Asthma    [place]cer ([place]   [place]   Essential hypertension 05/05/2020   GERD (gastroesophageal reflux disease)    GERD without esophagitis 05/05/2020   Hypercholesteremia    Hypertension    Pelvic abscess in female 05/05/2020   History reviewed. No pertinent surgical history.  Family History  Problem Relation Age of Onset   Breast cancer Mother    Cancer Daughter    Social History   Socioeconomic History   Marital status: Divorced    Spouse name: Not on file   Number of children: Not on file   Years of education: Not on file   Highest education level: Not on file  Occupational History   Not on file  Tobacco Use   Smoking status: Never   Smokeless tobacco: Never  Substance and Sexual Activity   Alcohol use: Never   Drug use: Never   Sexual activity: Never  Other Topics Concern   Not on file  Social History Narrative   Current Social  History 04/14/2021        Patient lives with her son a home which is 1 story. There are no steps up to the entrance.      Patient's method of transportation is personal car.      The highest level of education was high school diploma.      The patient currently retired.      Identified important Relationships are "with my sons"       Pets : None       Interests / Fun:  "Walking a little.  I use to sing in the choir and enjoyed that a lot".       Current Stressors: "When they found the cancer, but I am better now".       Social Determinants of Health   Financial Resource Strain: Low Risk  (09/24/2022)   Overall Financial Resource Strain (CARDIA)    Difficulty of Paying Living Expenses: Not hard at all  Food Insecurity: No Food Insecurity (09/24/2022)   Hunger Vital Sign    Worried About Running Out of Food in the Last Year: Never true    Ran Out of Food in the Last Year: Never true  Transportation Needs: No Transportation Needs (09/24/2022)   PRAPARE - Hydrologist (Medical): No    Lack of Transportation (Non-Medical): No  Physical Activity: Insufficiently Active (09/24/2022)   Exercise Vital Sign    Days of Exercise per Week: 2 days    Minutes of Exercise per Session: 30 min  Stress: No Stress Concern Present (09/24/2022)   [place]    Feeling of Stress : Not at all  Recent Concern: Stress - Stress Concern Present (06/29/2022)   [place]    Feeling of Stress : Rather much  Social Connections: Moderately Integrated (09/24/2022)   Social Connection and Isolation Panel [NHANES]    Frequency of Communication with Friends and Family: More than three times a week    Frequency of Social Gatherings with Friends and Family: More than three times a week    Attends Religious Services: More than 4 times per year    Active Member  of [employer] or Organizations: Yes    Attends Archivist Meetings: 1 to 4 times per year    Marital Status: Divorced    Tobacco Counseling Counseling given: Not Answered   Clinical Intake:  Pre-visit preparation completed: Yes  Pain : 0-10 Pain Score: 5  Pain Type: Chronic pain Pain Location: Buttocks Pain Orientation: Right Pain Descriptors / Indicators: Constant Pain Onset: Other (comment)     BMI - recorded: 29 Nutritional Risks: None Diabetes: No  How often do you need to  have someone help you when you read instructions, pamphlets, or other written materials from your doctor or pharmacy?: 1 - Never What is the last grade level you completed in school?: 12 grade  Diabetic?NO   Interpreter Needed?: No  Information entered by :: Danamarie Minami   Activities of Daily Living    09/24/2022   10:17 AM 03/26/2022    1:14 PM  In your present state of health, do you have any difficulty performing the following activities:  Hearing? 0 0  Vision? 0 0  Difficulty concentrating or making decisions? 0 0  Walking or climbing stairs? 0 1  Comment  AT TIMES  Dressing or bathing? 0 0  Doing errands, shopping? 0 1  Comment  AT [employer] and eating ? N   Using the Toilet? N   In the past six months, have you accidently leaked urine? N   Do you have problems with loss of bowel control? N   Managing your Medications? N   Managing your Finances? N   Housekeeping or managing your Housekeeping? N     Patient Care Team: Angelica Pou, MD as PCP - General (Internal Medicine) Orrin Brigham, MD as Referring Physician (Rheumatology)  Indicate any recent Medical Services you may have received from other than [facility] providers in the past year (date may be approximate).     Assessment:   This is a routine wellness examination for Haley Johnson.  Hearing/Vision screen No results found.  Dietary issues and exercise activities discussed: Current Exercise  Habits: Home exercise routine, Type of exercise: walking, Time (Minutes): 30, Frequency (Times/Week): 2, Weekly Exercise (Minutes/Week): 60, Intensity: Mild   Goals Addressed   None   Depression Screen    09/24/2022   10:15 AM 03/26/2022    1:14 PM 09/12/2021   10:57 AM 09/05/2021   11:19 AM 08/13/2021    1:32 PM 04/14/2021    2:40 PM 06/19/2020    3:34 PM  PHQ 2/9 Scores  PHQ - 2 Score 0 0 0 0 0 0 1  PHQ- 9 Score   0 1  0 6    Fall Risk    09/24/2022   10:17 AM 03/26/2022    1:14 PM 09/12/2021   10:56 AM 09/05/2021   11:18 AM 08/13/2021    1:31 PM  [place] in the past year? 0 0 0 1 0  Comment    FELL OUT OF BED   Number falls in past yr: 0  0 0 0  Comment    FELL OUT OF BED   Injury with Fall? 0  0 0 0  Risk for fall due to : Impaired balance/gait No Fall Risks No Fall Risks No Fall Risks Impaired balance/gait  Follow up Falls evaluation completed;Falls prevention discussed Falls evaluation completed Falls evaluation completed;Falls prevention discussed Falls evaluation completed;Education provided Falls evaluation completed    FALL RISK PREVENTION PERTAINING TO THE HOME:  Any stairs in or around the home? No  If so, are there any without handrails? No  Home free of loose throw rugs in walkways, pet beds, electrical cords, etc? Yes  Adequate lighting in your home to reduce risk of falls? Yes   ASSISTIVE DEVICES UTILIZED TO PREVENT FALLS:  Life alert? No  Use of a cane, walker or w/c? Yes  Grab bars in the bathroom? Yes  Shower chair or bench in shower? Yes  Elevated toilet seat or a handicapped toilet? Yes  TIMED UP AND GO:  Was the test performed? No .  Length of time to ambulate 10 feet: NA sec.     Cognitive Function:        09/24/2022   10:18 AM  6CIT Screen  What Year? 0 points  What month? 0 points  What time? 0 points  Count back from 20 0 points  Months in reverse 0 points    Immunizations Immunization History  Administered Date(s)  Administered   Fluad Quad(high Dose 65+) 09/05/2021   Influenza Inj Mdck Quad Pf 10/03/2018   Moderna Sars-Covid-2 Vaccination 01/03/2020, 02/01/2020, 08/08/2020   PFIZER Comirnaty(Gray Top)Covid-19 Tri-Sucrose Vaccine 08/07/2021   Pfizer Covid-19 Vaccine Bivalent Booster 32yr & up 08/07/2021   Pneumococcal Conjugate-13 05/10/2014   Pneumococcal Polysaccharide-23 11/23/2008, 06/02/2017   Tdap 09/28/2011    TDAP status: Due, Education has been provided regarding the importance of this vaccine. Advised may receive this vaccine at local pharmacy or Health Dept. Aware to provide a copy of the vaccination record if obtained from local pharmacy or Health Dept. Verbalized acceptance and understanding.  Flu Vaccine status:  up to date   Covid-19 vaccine status: Completed vaccines  Qualifies for Shingles Vaccine? Yes   Zostavax completed No   Shingrix Completed?: No.    Education has been provided regarding the importance of this vaccine. Patient has been advised to call insurance company to determine out of pocket expense if they have not yet received this vaccine. Advised may also receive vaccine at local pharmacy or Health Dept. Verbalized acceptance and understanding.  Screening Tests Health Maintenance  Topic Date Due   Zoster Vaccines- Shingrix (1 of 2) Never done   COLONOSCOPY (Pts 45-440yrInsurance coverage will need to be confirmed)  Never done   DEXA SCAN  Never done   TETANUS/TDAP  09/27/2021   COVID-19 Vaccine (5 - Moderna risk series) 10/02/2021   INFLUENZA VACCINE  06/23/2022   Medicare Annual Wellness (AWV)  09/25/2023   Pneumonia Vaccine 6529Years old  Completed   HPV VACCINES  Aged Out   Hepatitis C Screening  Discontinued    Health Maintenance  Health Maintenance Due  Topic Date Due   Zoster Vaccines- Shingrix (1 of 2) Never done   COLONOSCOPY (Pts 45-4933yrnsurance coverage will need to be confirmed)  Never done   DEXA SCAN  Never done   TETANUS/TDAP   09/27/2021   COVID-19 Vaccine (5 - Moderna risk series) 10/02/2021   INFLUENZA VACCINE  06/23/2022    Colorectal cancer screening: No longer required.   Mammogram status: Completed 2023. Repeat every year  Bone Density status: Completed 2022. Results reflect: Bone density results: NORMAL. Repeat every 1 years.  Lung Cancer Screening: (Low Dose CT Chest recommended if Age 36-62-80ars, 30 pack-year currently smoking OR have quit w/in 15years.) does not qualify.   Lung Cancer Screening Referral: DEFERRED TO PCP   Additional Screening:  Hepatitis C Screening: does not    Vision Screening: Recommended annual ophthalmology exams for early detection of glaucoma and other disorders of the eye. Is the patient up to date with their annual eye exam?  Yes  Who is the provider or what is the name of the office in which the patient attends annual eye exams? [facility]  If pt is not established with a provider, would they like to be referred to a provider to establish care? No .   Dental Screening: Recommended annual dental exams for proper oral hygiene  [employer]  Referral / Chronic Care Management: CRR required this visit?  No   CCM required this visit?  No      Plan:     I have personally reviewed and noted the following in the patient's chart:   Medical and social history Use of alcohol, tobacco or illicit drugs  Current medications and supplements including opioid prescriptions. Patient is currently taking opioid prescriptions. Information provided to patient regarding non-opioid alternatives. Patient advised to discuss non-opioid treatment plan with their provider. Functional ability and status Nutritional status Physical activity Advanced directives List of other physicians Hospitalizations, surgeries, and ER visits in previous 12 months Vitals Screenings to include cognitive, depression, and falls Referrals and appointments  In addition, I have reviewed and  discussed with patient certain preventive protocols, quality metrics, and best practice recommendations. A written personalized care plan for preventive services as well as general preventive health recommendations were provided to patient.     Judyann Munson, CMA   09/24/2022   Nurse Note: IN PERSON [facility]     Ms. Trolinger , Thank you for taking time to come for your Medicare Wellness Visit. I appreciate your ongoing commitment to your health goals. Please review the following plan we discussed and let me know if I can assist you in the future.   These are the goals we discussed:  Goals       Increase the strength in my legs (pt-stated)      Begin seated and standing exercises with exercise band 2-3 times/week for 10 minutes each time to increase strength and balance.         This is a list of the screening recommended for you and due dates:  Health Maintenance  Topic Date Due   Zoster (Shingles) Vaccine (1 of 2) Never done   Colon Cancer Screening  Never done   DEXA scan (bone density measurement)  Never done   Tetanus Vaccine  09/27/2021   COVID-19 Vaccine (5 - Moderna risk series) 10/02/2021   Flu Shot  06/23/2022   Medicare Annual Wellness Visit  09/25/2023   Pneumonia Vaccine  Completed   HPV Vaccine  Aged Out   Hepatitis C Screening: USPSTF Recommendation to screen - Ages 18-79 yo.  Discontinued

## 2022-09-25 LAB — LIPID PANEL
Chol/HDL Ratio: 2 ratio (ref 0.0–4.4)
Cholesterol, Total: 97 mg/dL — ABNORMAL LOW (ref 100–199)
HDL: 49 mg/dL (ref >39)
LDL Chol Calc (NIH): 30 mg/dL (ref 0–99)
Triglycerides: 92 mg/dL (ref 0–149)
VLDL Cholesterol Cal: 18 mg/dL (ref 5–40)

## 2022-09-25 LAB — VITAMIN D 25 HYDROXY (VIT D DEFICIENCY, FRACTURES): Vit D, 25-Hydroxy: 36.1 ng/mL (ref 30.0–100.0)

## 2022-09-30 DIAGNOSIS — R269 Unspecified abnormalities of gait and mobility: Secondary | ICD-10-CM | POA: Diagnosis not present

## 2022-09-30 DIAGNOSIS — M4726 Other spondylosis with radiculopathy, lumbar region: Secondary | ICD-10-CM | POA: Diagnosis not present

## 2022-09-30 DIAGNOSIS — M5416 Radiculopathy, lumbar region: Secondary | ICD-10-CM | POA: Diagnosis not present

## 2022-09-30 DIAGNOSIS — M7918 Myalgia, other site: Secondary | ICD-10-CM | POA: Diagnosis not present

## 2022-09-30 DIAGNOSIS — C579 Malignant neoplasm of female genital organ, unspecified: Secondary | ICD-10-CM | POA: Diagnosis not present

## 2022-10-09 DIAGNOSIS — Z5111 Encounter for antineoplastic chemotherapy: Secondary | ICD-10-CM | POA: Diagnosis not present

## 2022-10-09 DIAGNOSIS — C579 Malignant neoplasm of female genital organ, unspecified: Secondary | ICD-10-CM | POA: Diagnosis not present

## 2022-10-11 NOTE — Assessment & Plan Note (Signed)
UTD on colonoscopy, mammography, DEXA, vaccines (except Shingrix).  Received flu shot this visit.

## 2022-10-11 NOTE — Assessment & Plan Note (Signed)
Due for lipid eval this visit.

## 2022-10-11 NOTE — Assessment & Plan Note (Signed)
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  Continues daily prednisone currently 7.5 mg managed by [facility].  Symptoms well managed.  DEXA UTD at [facility] 2022; osteopenic in some areas, normal in others.  Vit D level this visit.

## 2022-10-28 ENCOUNTER — Other Ambulatory Visit: Payer: Self-pay | Admitting: Internal Medicine

## 2022-11-02 ENCOUNTER — Other Ambulatory Visit: Payer: Self-pay | Admitting: Internal Medicine

## 2022-11-02 ENCOUNTER — Telehealth: Payer: Self-pay | Admitting: *Deleted

## 2022-11-02 DIAGNOSIS — M48062 Spinal stenosis, lumbar region with neurogenic claudication: Secondary | ICD-10-CM

## 2022-11-02 MED ORDER — IBUPROFEN 600 MG PO TABS: 600.0000 mg | ORAL_TABLET | Freq: Four times a day (QID) | ORAL | 0 refills | Status: DC | PRN

## 2022-11-02 NOTE — Telephone Encounter (Signed)
Talked to pt's son who stated she needs the stronger dose (600 mg) for on-going back pain. He stated this has been mentioned to her doctor before and it should be in the notes.

## 2022-11-02 NOTE — Telephone Encounter (Signed)
Requesting a refill on Ibuprofen 600 mg Take 1 tab by mouth every 8 hrs as needed for pain; last refill was 10/07/22. Medication is not on current med list - states it has expired.

## 2022-11-03 ENCOUNTER — Other Ambulatory Visit: Payer: Self-pay | Admitting: Internal Medicine

## 2022-11-03 NOTE — Telephone Encounter (Signed)
No longer needed

## 2022-11-20 DIAGNOSIS — C579 Malignant neoplasm of female genital organ, unspecified: Secondary | ICD-10-CM | POA: Diagnosis not present

## 2022-11-20 DIAGNOSIS — Z5111 Encounter for antineoplastic chemotherapy: Secondary | ICD-10-CM | POA: Diagnosis not present

## 2022-11-20 DIAGNOSIS — M7918 Myalgia, other site: Secondary | ICD-10-CM | POA: Diagnosis not present

## 2022-11-20 DIAGNOSIS — R269 Unspecified abnormalities of gait and mobility: Secondary | ICD-10-CM | POA: Diagnosis not present

## 2022-11-20 DIAGNOSIS — E119 Type 2 diabetes mellitus without complications: Secondary | ICD-10-CM | POA: Diagnosis not present

## 2022-11-20 DIAGNOSIS — I10 Essential (primary) hypertension: Secondary | ICD-10-CM | POA: Diagnosis not present

## 2022-11-20 DIAGNOSIS — C569 Malignant neoplasm of unspecified ovary: Secondary | ICD-10-CM | POA: Diagnosis not present

## 2022-11-20 DIAGNOSIS — J45909 Unspecified asthma, uncomplicated: Secondary | ICD-10-CM | POA: Diagnosis not present

## 2022-11-20 DIAGNOSIS — M3313 Other dermatomyositis without myopathy: Secondary | ICD-10-CM | POA: Diagnosis not present

## 2022-11-20 DIAGNOSIS — D649 Anemia, unspecified: Secondary | ICD-10-CM | POA: Diagnosis not present

## 2022-11-20 DIAGNOSIS — M5416 Radiculopathy, lumbar region: Secondary | ICD-10-CM | POA: Diagnosis not present

## 2022-11-20 DIAGNOSIS — C561 Malignant neoplasm of right ovary: Secondary | ICD-10-CM | POA: Diagnosis not present

## 2022-11-30 DIAGNOSIS — M3390 Dermatopolymyositis, unspecified, organ involvement unspecified: Secondary | ICD-10-CM | POA: Diagnosis not present

## 2022-12-03 ENCOUNTER — Other Ambulatory Visit: Payer: Self-pay | Admitting: Internal Medicine

## 2022-12-03 ENCOUNTER — Telehealth: Payer: Self-pay | Admitting: Internal Medicine

## 2022-12-03 ENCOUNTER — Telehealth: Payer: Self-pay

## 2022-12-03 DIAGNOSIS — C579 Malignant neoplasm of female genital organ, unspecified: Secondary | ICD-10-CM

## 2022-12-03 DIAGNOSIS — M545 Low back pain, unspecified: Secondary | ICD-10-CM

## 2022-12-03 NOTE — Telephone Encounter (Signed)
Rec'd an order from [facility] in reference to the pt having an MRI.  The pt is req to have her MRI order performed at [facility] due to  traveling back and forth to and from [place]. Pt is aware you have not placed this order but [facility] has, but she's wanting to know how she can get our office to Place the order instead.  Please see the Order from [facility] placed in your box.  Does this pt need to be seen @ our office. The pt  has called today and is adamant that she wants to have it done here at [facility] if at all possible.

## 2022-12-03 NOTE — Telephone Encounter (Signed)
Requesting all meds to be filled @ [place], [place].

## 2022-12-11 DIAGNOSIS — M5416 Radiculopathy, lumbar region: Secondary | ICD-10-CM | POA: Diagnosis not present

## 2022-12-11 DIAGNOSIS — I10 Essential (primary) hypertension: Secondary | ICD-10-CM | POA: Diagnosis not present

## 2022-12-11 DIAGNOSIS — J45909 Unspecified asthma, uncomplicated: Secondary | ICD-10-CM | POA: Diagnosis not present

## 2022-12-11 DIAGNOSIS — C569 Malignant neoplasm of unspecified ovary: Secondary | ICD-10-CM | POA: Diagnosis not present

## 2022-12-11 DIAGNOSIS — D499 Neoplasm of unspecified behavior of unspecified site: Secondary | ICD-10-CM | POA: Diagnosis not present

## 2022-12-11 DIAGNOSIS — C577 Malignant neoplasm of other specified female genital organs: Secondary | ICD-10-CM | POA: Diagnosis not present

## 2022-12-11 DIAGNOSIS — C799 Secondary malignant neoplasm of unspecified site: Secondary | ICD-10-CM | POA: Diagnosis not present

## 2022-12-11 DIAGNOSIS — E119 Type 2 diabetes mellitus without complications: Secondary | ICD-10-CM | POA: Diagnosis not present

## 2022-12-11 DIAGNOSIS — C763 Malignant neoplasm of pelvis: Secondary | ICD-10-CM | POA: Diagnosis not present

## 2022-12-14 ENCOUNTER — Other Ambulatory Visit: Payer: Self-pay

## 2022-12-14 ENCOUNTER — Other Ambulatory Visit: Payer: Self-pay | Admitting: Internal Medicine

## 2022-12-14 DIAGNOSIS — B0229 Other postherpetic nervous system involvement: Secondary | ICD-10-CM

## 2022-12-14 DIAGNOSIS — M48062 Spinal stenosis, lumbar region with neurogenic claudication: Secondary | ICD-10-CM

## 2022-12-14 MED ORDER — PREGABALIN 20 MG/ML PO SOLN: 150.0000 mg | Freq: Three times a day (TID) | ORAL | 5 refills | Status: DC

## 2022-12-22 MED ORDER — DULERA 200-5 MCG/ACT IN AERO: 2.0000 | INHALATION_SPRAY | Freq: Every day | RESPIRATORY_TRACT | 5 refills | Status: DC | PRN

## 2022-12-24 DIAGNOSIS — C763 Malignant neoplasm of pelvis: Secondary | ICD-10-CM | POA: Diagnosis not present

## 2022-12-24 DIAGNOSIS — M5416 Radiculopathy, lumbar region: Secondary | ICD-10-CM | POA: Diagnosis not present

## 2022-12-24 DIAGNOSIS — E119 Type 2 diabetes mellitus without complications: Secondary | ICD-10-CM | POA: Diagnosis not present

## 2022-12-24 DIAGNOSIS — C577 Malignant neoplasm of other specified female genital organs: Secondary | ICD-10-CM | POA: Diagnosis not present

## 2022-12-24 DIAGNOSIS — C569 Malignant neoplasm of unspecified ovary: Secondary | ICD-10-CM | POA: Diagnosis not present

## 2022-12-24 DIAGNOSIS — J45909 Unspecified asthma, uncomplicated: Secondary | ICD-10-CM | POA: Diagnosis not present

## 2022-12-24 DIAGNOSIS — C799 Secondary malignant neoplasm of unspecified site: Secondary | ICD-10-CM | POA: Diagnosis not present

## 2022-12-24 DIAGNOSIS — D499 Neoplasm of unspecified behavior of unspecified site: Secondary | ICD-10-CM | POA: Diagnosis not present

## 2022-12-24 DIAGNOSIS — I10 Essential (primary) hypertension: Secondary | ICD-10-CM | POA: Diagnosis not present

## 2022-12-25 DIAGNOSIS — C579 Malignant neoplasm of female genital organ, unspecified: Secondary | ICD-10-CM | POA: Diagnosis not present

## 2022-12-25 DIAGNOSIS — Z5111 Encounter for antineoplastic chemotherapy: Secondary | ICD-10-CM | POA: Diagnosis not present

## 2022-12-29 ENCOUNTER — Ambulatory Visit
Admission: RE | Admit: 2022-12-29 | Discharge: 2022-12-29 | Disposition: A | Discharge status: Home / Self Care | Payer: Medicare PPO | Source: Ambulatory Visit | Attending: Internal Medicine | Admitting: Internal Medicine

## 2022-12-29 DIAGNOSIS — C579 Malignant neoplasm of female genital organ, unspecified: Secondary | ICD-10-CM

## 2022-12-29 DIAGNOSIS — M545 Low back pain, unspecified: Secondary | ICD-10-CM

## 2022-12-29 DIAGNOSIS — M5136 Other intervertebral disc degeneration, lumbar region: Secondary | ICD-10-CM | POA: Diagnosis not present

## 2022-12-29 DIAGNOSIS — M48061 Spinal stenosis, lumbar region without neurogenic claudication: Secondary | ICD-10-CM | POA: Diagnosis not present

## 2022-12-31 ENCOUNTER — Other Ambulatory Visit: Payer: Self-pay

## 2022-12-31 DIAGNOSIS — E119 Type 2 diabetes mellitus without complications: Secondary | ICD-10-CM | POA: Diagnosis not present

## 2022-12-31 DIAGNOSIS — J45909 Unspecified asthma, uncomplicated: Secondary | ICD-10-CM | POA: Diagnosis not present

## 2022-12-31 DIAGNOSIS — C577 Malignant neoplasm of other specified female genital organs: Secondary | ICD-10-CM | POA: Diagnosis not present

## 2022-12-31 DIAGNOSIS — M5416 Radiculopathy, lumbar region: Secondary | ICD-10-CM | POA: Diagnosis not present

## 2022-12-31 DIAGNOSIS — C763 Malignant neoplasm of pelvis: Secondary | ICD-10-CM | POA: Diagnosis not present

## 2022-12-31 DIAGNOSIS — I10 Essential (primary) hypertension: Secondary | ICD-10-CM | POA: Diagnosis not present

## 2022-12-31 DIAGNOSIS — C799 Secondary malignant neoplasm of unspecified site: Secondary | ICD-10-CM | POA: Diagnosis not present

## 2022-12-31 DIAGNOSIS — D499 Neoplasm of unspecified behavior of unspecified site: Secondary | ICD-10-CM | POA: Diagnosis not present

## 2022-12-31 DIAGNOSIS — C569 Malignant neoplasm of unspecified ovary: Secondary | ICD-10-CM | POA: Diagnosis not present

## 2022-12-31 MED ORDER — HYDROCHLOROTHIAZIDE 12.5 MG PO CAPS: 12.5000 mg | ORAL_CAPSULE | Freq: Every day | ORAL | 3 refills | Status: DC

## 2023-01-06 DIAGNOSIS — C569 Malignant neoplasm of unspecified ovary: Secondary | ICD-10-CM | POA: Diagnosis not present

## 2023-01-06 DIAGNOSIS — E119 Type 2 diabetes mellitus without complications: Secondary | ICD-10-CM | POA: Diagnosis not present

## 2023-01-06 DIAGNOSIS — M5416 Radiculopathy, lumbar region: Secondary | ICD-10-CM | POA: Diagnosis not present

## 2023-01-06 DIAGNOSIS — C799 Secondary malignant neoplasm of unspecified site: Secondary | ICD-10-CM | POA: Diagnosis not present

## 2023-01-06 DIAGNOSIS — J45909 Unspecified asthma, uncomplicated: Secondary | ICD-10-CM | POA: Diagnosis not present

## 2023-01-06 DIAGNOSIS — D499 Neoplasm of unspecified behavior of unspecified site: Secondary | ICD-10-CM | POA: Diagnosis not present

## 2023-01-06 DIAGNOSIS — C763 Malignant neoplasm of pelvis: Secondary | ICD-10-CM | POA: Diagnosis not present

## 2023-01-06 DIAGNOSIS — C577 Malignant neoplasm of other specified female genital organs: Secondary | ICD-10-CM | POA: Diagnosis not present

## 2023-01-06 DIAGNOSIS — I10 Essential (primary) hypertension: Secondary | ICD-10-CM | POA: Diagnosis not present

## 2023-01-11 ENCOUNTER — Other Ambulatory Visit: Payer: Self-pay

## 2023-01-11 DIAGNOSIS — M48062 Spinal stenosis, lumbar region with neurogenic claudication: Secondary | ICD-10-CM

## 2023-01-13 ENCOUNTER — Other Ambulatory Visit: Payer: Self-pay

## 2023-01-14 DIAGNOSIS — J45909 Unspecified asthma, uncomplicated: Secondary | ICD-10-CM | POA: Diagnosis not present

## 2023-01-14 DIAGNOSIS — D499 Neoplasm of unspecified behavior of unspecified site: Secondary | ICD-10-CM | POA: Diagnosis not present

## 2023-01-14 DIAGNOSIS — C577 Malignant neoplasm of other specified female genital organs: Secondary | ICD-10-CM | POA: Diagnosis not present

## 2023-01-14 DIAGNOSIS — C799 Secondary malignant neoplasm of unspecified site: Secondary | ICD-10-CM | POA: Diagnosis not present

## 2023-01-14 DIAGNOSIS — C763 Malignant neoplasm of pelvis: Secondary | ICD-10-CM | POA: Diagnosis not present

## 2023-01-14 DIAGNOSIS — M5416 Radiculopathy, lumbar region: Secondary | ICD-10-CM | POA: Diagnosis not present

## 2023-01-14 DIAGNOSIS — I10 Essential (primary) hypertension: Secondary | ICD-10-CM | POA: Diagnosis not present

## 2023-01-14 DIAGNOSIS — C569 Malignant neoplasm of unspecified ovary: Secondary | ICD-10-CM | POA: Diagnosis not present

## 2023-01-14 DIAGNOSIS — E119 Type 2 diabetes mellitus without complications: Secondary | ICD-10-CM | POA: Diagnosis not present

## 2023-01-18 ENCOUNTER — Other Ambulatory Visit: Payer: Self-pay | Admitting: Internal Medicine

## 2023-01-18 ENCOUNTER — Encounter: Payer: Self-pay | Admitting: Internal Medicine

## 2023-01-18 DIAGNOSIS — J45909 Unspecified asthma, uncomplicated: Secondary | ICD-10-CM | POA: Insufficient documentation

## 2023-01-18 DIAGNOSIS — J4520 Mild intermittent asthma, uncomplicated: Secondary | ICD-10-CM | POA: Insufficient documentation

## 2023-01-18 MED ORDER — BUDESONIDE-FORMOTEROL FUMARATE 80-4.5 MCG/ACT IN AERO: 2.0000 | INHALATION_SPRAY | Freq: Every day | RESPIRATORY_TRACT | 9 refills | Status: DC | PRN

## 2023-01-18 MED ORDER — IBUPROFEN 600 MG PO TABS: 600.0000 mg | ORAL_TABLET | Freq: Four times a day (QID) | ORAL | 0 refills | Status: DC | PRN

## 2023-01-18 NOTE — Telephone Encounter (Signed)
Not covered by insurance, no problem associated with medication, needs office visit to discuss

## 2023-01-18 NOTE — Telephone Encounter (Signed)
Correct the preferred medication is Janyth Contes, insurance plan does not cover Dulera.

## 2023-01-19 NOTE — Telephone Encounter (Signed)
Called and spoke to patients son he is going to give [place] a call back to schedule a fu appointment. He is asking Haley Johnson's availability.

## 2023-01-21 DIAGNOSIS — C799 Secondary malignant neoplasm of unspecified site: Secondary | ICD-10-CM | POA: Diagnosis not present

## 2023-01-21 DIAGNOSIS — C763 Malignant neoplasm of pelvis: Secondary | ICD-10-CM | POA: Diagnosis not present

## 2023-01-21 DIAGNOSIS — E119 Type 2 diabetes mellitus without complications: Secondary | ICD-10-CM | POA: Diagnosis not present

## 2023-01-21 DIAGNOSIS — C577 Malignant neoplasm of other specified female genital organs: Secondary | ICD-10-CM | POA: Diagnosis not present

## 2023-01-21 DIAGNOSIS — I10 Essential (primary) hypertension: Secondary | ICD-10-CM | POA: Diagnosis not present

## 2023-01-21 DIAGNOSIS — M5416 Radiculopathy, lumbar region: Secondary | ICD-10-CM | POA: Diagnosis not present

## 2023-01-21 DIAGNOSIS — C569 Malignant neoplasm of unspecified ovary: Secondary | ICD-10-CM | POA: Diagnosis not present

## 2023-01-21 DIAGNOSIS — D499 Neoplasm of unspecified behavior of unspecified site: Secondary | ICD-10-CM | POA: Diagnosis not present

## 2023-01-21 DIAGNOSIS — J45909 Unspecified asthma, uncomplicated: Secondary | ICD-10-CM | POA: Diagnosis not present

## 2023-01-22 DIAGNOSIS — M7918 Myalgia, other site: Secondary | ICD-10-CM | POA: Diagnosis not present

## 2023-01-22 DIAGNOSIS — C579 Malignant neoplasm of female genital organ, unspecified: Secondary | ICD-10-CM | POA: Diagnosis not present

## 2023-01-22 DIAGNOSIS — R269 Unspecified abnormalities of gait and mobility: Secondary | ICD-10-CM | POA: Diagnosis not present

## 2023-01-22 DIAGNOSIS — Z5111 Encounter for antineoplastic chemotherapy: Secondary | ICD-10-CM | POA: Diagnosis not present

## 2023-01-22 DIAGNOSIS — M5416 Radiculopathy, lumbar region: Secondary | ICD-10-CM | POA: Diagnosis not present

## 2023-01-22 DIAGNOSIS — C569 Malignant neoplasm of unspecified ovary: Secondary | ICD-10-CM | POA: Diagnosis not present

## 2023-02-04 ENCOUNTER — Other Ambulatory Visit: Payer: Self-pay

## 2023-02-05 MED ORDER — AMLODIPINE BESYLATE 5 MG PO TABS: ORAL_TABLET | ORAL | 3 refills | Status: DC

## 2023-02-15 DIAGNOSIS — M5416 Radiculopathy, lumbar region: Secondary | ICD-10-CM | POA: Diagnosis not present

## 2023-02-15 DIAGNOSIS — M5417 Radiculopathy, lumbosacral region: Secondary | ICD-10-CM | POA: Diagnosis not present

## 2023-02-18 DIAGNOSIS — C579 Malignant neoplasm of female genital organ, unspecified: Secondary | ICD-10-CM | POA: Diagnosis not present

## 2023-02-18 DIAGNOSIS — R1310 Dysphagia, unspecified: Secondary | ICD-10-CM | POA: Diagnosis not present

## 2023-02-18 DIAGNOSIS — M5416 Radiculopathy, lumbar region: Secondary | ICD-10-CM | POA: Diagnosis not present

## 2023-02-18 DIAGNOSIS — Z9225 Personal history of immunosupression therapy: Secondary | ICD-10-CM | POA: Diagnosis not present

## 2023-02-18 DIAGNOSIS — M3390 Dermatopolymyositis, unspecified, organ involvement unspecified: Secondary | ICD-10-CM | POA: Diagnosis not present

## 2023-02-23 ENCOUNTER — Other Ambulatory Visit: Payer: Self-pay

## 2023-02-23 MED ORDER — FERROUS SULFATE 325 (65 FE) MG PO TABS: ORAL_TABLET | ORAL | 3 refills | Status: DC

## 2023-03-11 ENCOUNTER — Ambulatory Visit (INDEPENDENT_AMBULATORY_CARE_PROVIDER_SITE_OTHER): Payer: Medicare PPO | Admitting: Internal Medicine

## 2023-03-11 VITALS — BP 122/71 | HR 70 | Temp 97.7°F | Ht 62.0 in | Wt 146.6 lb

## 2023-03-11 DIAGNOSIS — R599 Enlarged lymph nodes, unspecified: Secondary | ICD-10-CM | POA: Diagnosis not present

## 2023-03-11 DIAGNOSIS — K222 Esophageal obstruction: Secondary | ICD-10-CM

## 2023-03-11 DIAGNOSIS — M5441 Lumbago with sciatica, right side: Secondary | ICD-10-CM

## 2023-03-11 DIAGNOSIS — J4520 Mild intermittent asthma, uncomplicated: Secondary | ICD-10-CM

## 2023-03-11 DIAGNOSIS — I10 Essential (primary) hypertension: Secondary | ICD-10-CM

## 2023-03-11 DIAGNOSIS — G8929 Other chronic pain: Secondary | ICD-10-CM

## 2023-03-11 DIAGNOSIS — M3313 Other dermatomyositis without myopathy: Secondary | ICD-10-CM

## 2023-03-11 DIAGNOSIS — E782 Mixed hyperlipidemia: Secondary | ICD-10-CM

## 2023-03-11 NOTE — Assessment & Plan Note (Addendum)
09/2022: TC 97, LDL 30 on rosuvastatin 40 mg daily.  This can easily be reduced to lower dose.  I'll discuss with her.

## 2023-03-11 NOTE — Assessment & Plan Note (Signed)
Approx 1 cm subdermal mobile soft nontender thickening in R infraorbital maxillary skin, without overlying skin change. She has had one on the L as well, which seemed to go away. Not bothersome.  No eye or surrounding skin injury or lesion.  C/W suborbital LN, probably reactive given characteristics (not hard or fixed), will monitor.

## 2023-03-11 NOTE — Assessment & Plan Note (Addendum)
Notices that pollen has exacerbated breathing.  No cough, but her nose runs, sneezes but no eye symptoms.  No wheezing.  Had an inhaler, but empty for a couple of years.  She may not have active asthma at this time.  Will look for former PFTs.

## 2023-03-11 NOTE — Assessment & Plan Note (Signed)
Hx of dilatation, becoming symptomatic again, has f/u at [facility].

## 2023-03-11 NOTE — Assessment & Plan Note (Signed)
122/71 well-controlled on amlodipine 5 mg, HCTZ 12.5 mg daily.  01/2023 at [facility]: sodium 140, potassium 4.2, BUN 21, creatinine 0.8, EGFR 77.  Continue.

## 2023-03-11 NOTE — Patient Instructions (Signed)
Great to see you, Haley Johnson! I'm glad you're getting good care over at [facility].  I know it can be confusing to have so many doctors, but they're keeping a close eye on you.    Today your blood pressure looks terrific!  No need for blood work today, all is well.  Let's get together in 6 months.  Take care and stay well, Dr. Mayford Knife

## 2023-03-11 NOTE — Assessment & Plan Note (Signed)
No durable response to ESI at [facility].

## 2023-03-11 NOTE — Progress Notes (Signed)
77 year old Haley Johnson is here for routine follow-up of HTN and hyperlipidemia, her last visit being 09/2022.  She is actively followed by specialist at [facility] including rheumatologist for her dermatomyositis (prednisone decreased from 7.5 to 5 mg daily since her last visit here), oncologist for her pelvic malignancy, spine specialist for her low back pain and radiculopathy (unfortunately back pain with radiculopathy did not respond to [facility]), and gastroenterology for recurrent esophageal strictures (evaluation upcoming as her symptoms are returning).  She has been feeling fairly well-getting about functionally, no cognitive concerns, driving short distances-and has no issues to report, though wants to bring to my attention a bump in her right cheek.    Cream needs  - once used for skin lesions can't recall which. She will call.  No new spots in some time, worried that she may have a return and wants some on hand.    Objective: BP 122/71 (BP Location: Left Arm, Patient Position: Sitting, Cuff Size: Small)   Pulse 70   Temp 97.7 F (36.5 C) (Oral)   Ht  (1.575 m)   Wt 146 lb 9.6 oz (66.5 kg)   SpO2 100%   BMI 26.81 kg/m  Haley Johnson is looking well today, with friendly positive energy. Approx 1 cm subdermal mobile soft nontender thickening in R infraorbital maxillary skin, without overlying skin change. She has had one on the L as well, which seemed to go away.  Mild swelling dorsal right foot; dp 2+ R, 1+ L, anterior tibial small round scars of former ulcers.  01/2023 at [facility]: Hgb 11.7, MCV 111, RDW 18.6, sodium 140, potassium 4.2, BUN 21, creatinine 0.8, AST 50, ALT 48, alk phos 70, albumin 4, calcium 9.6  Assessment and plan:  Allergic asthma, mild intermittent, uncomplicated Notices that pollen has exacerbated breathing.  No cough, but her nose runs, sneezes but no eye symptoms.  No wheezing.  Had an inhaler, but empty for a couple of years.  She may not have active asthma at this time.  Will  look for former PFTs.   Esophageal stricture Hx of dilatation, becoming symptomatic again, has f/u at [facility].    Chronic right-sided low back pain with right-sided sciatica No durable response to ESI at [facility].    Mixed hyperlipidemia 09/2022: TC 97, LDL 30 on rosuvastatin 40 mg daily.  This can easily be reduced to lower dose.  I'll discuss with her.   Benign essential hypertension 122/71 well-controlled on amlodipine 5 mg, HCTZ 12.5 mg daily.  01/2023 at [facility]: sodium 140, potassium 4.2, BUN 21, creatinine 0.8, EGFR 77.  Continue.   Enlarged suborbital lymph node Approx 1 cm subdermal mobile soft nontender thickening in R infraorbital maxillary skin, without overlying skin change. She has had one on the L as well, which seemed to go away. Not bothersome.  No eye or surrounding skin injury or lesion.  C/W suborbital LN, probably reactive given characteristics (not hard or fixed), will monitor.

## 2023-03-25 DIAGNOSIS — Z5111 Encounter for antineoplastic chemotherapy: Secondary | ICD-10-CM | POA: Diagnosis not present

## 2023-03-25 DIAGNOSIS — C569 Malignant neoplasm of unspecified ovary: Secondary | ICD-10-CM | POA: Diagnosis not present

## 2023-03-30 ENCOUNTER — Other Ambulatory Visit: Payer: Self-pay | Admitting: Obstetrics and Gynecology

## 2023-03-30 DIAGNOSIS — Z5111 Encounter for antineoplastic chemotherapy: Secondary | ICD-10-CM

## 2023-03-30 DIAGNOSIS — C579 Malignant neoplasm of female genital organ, unspecified: Secondary | ICD-10-CM

## 2023-03-31 ENCOUNTER — Other Ambulatory Visit: Payer: Self-pay

## 2023-03-31 ENCOUNTER — Ambulatory Visit (INDEPENDENT_AMBULATORY_CARE_PROVIDER_SITE_OTHER): Payer: Medicare PPO | Admitting: Internal Medicine

## 2023-03-31 DIAGNOSIS — C579 Malignant neoplasm of female genital organ, unspecified: Secondary | ICD-10-CM

## 2023-03-31 DIAGNOSIS — Z5111 Encounter for antineoplastic chemotherapy: Secondary | ICD-10-CM

## 2023-03-31 DIAGNOSIS — M48062 Spinal stenosis, lumbar region with neurogenic claudication: Secondary | ICD-10-CM

## 2023-03-31 LAB — CBC WITH DIFFERENTIAL/PLATELET
Abs Immature Granulocytes: 0 10*3/uL (ref 0.00–0.07)
Basophils Absolute: 0 10*3/uL (ref 0.0–0.1)
Basophils Relative: 0 %
Eosinophils Absolute: 0 10*3/uL (ref 0.0–0.5)
Eosinophils Relative: 0 %
HCT: 35.8 % — ABNORMAL LOW (ref 36.0–46.0)
Hemoglobin: 11.5 g/dL — ABNORMAL LOW (ref 12.0–15.0)
Lymphocytes Relative: 14 %
Lymphs Abs: 1.1 10*3/uL (ref 0.7–4.0)
MCH: 35.4 pg — ABNORMAL HIGH (ref 26.0–34.0)
MCHC: 32.1 g/dL (ref 30.0–36.0)
MCV: 110.2 fL — ABNORMAL HIGH (ref 80.0–100.0)
Monocytes Absolute: 0.8 10*3/uL (ref 0.1–1.0)
Monocytes Relative: 10 %
Neutro Abs: 5.9 10*3/uL (ref 1.7–7.7)
Neutrophils Relative %: 76 %
Platelets: 145 10*3/uL — ABNORMAL LOW (ref 150–400)
RBC: 3.25 MIL/uL — ABNORMAL LOW (ref 3.87–5.11)
RDW: 17.5 % — ABNORMAL HIGH (ref 11.5–15.5)
WBC: 7.8 10*3/uL (ref 4.0–10.5)
nRBC: 0 /100 WBC
nRBC: 0.3 % — ABNORMAL HIGH (ref 0.0–0.2)

## 2023-03-31 LAB — COMPREHENSIVE METABOLIC PANEL
ALT: 22 U/L (ref 0–44)
AST: 25 U/L (ref 15–41)
Albumin: 3.3 g/dL — ABNORMAL LOW (ref 3.5–5.0)
Alkaline Phosphatase: 56 U/L (ref 38–126)
Anion gap: 10 (ref 5–15)
BUN: 16 mg/dL (ref 8–23)
CO2: 27 mmol/L (ref 22–32)
Calcium: 9.3 mg/dL (ref 8.9–10.3)
Chloride: 99 mmol/L (ref 98–111)
Creatinine, Ser: 0.69 mg/dL (ref 0.44–1.00)
GFR, Estimated: >60 mL/min (ref >60)
Glucose, Bld: 80 mg/dL (ref 70–99)
Potassium: 3.5 mmol/L (ref 3.5–5.1)
Sodium: 136 mmol/L (ref 135–145)
Total Bilirubin: 0.5 mg/dL (ref 0.3–1.2)
Total Protein: 5.9 g/dL — ABNORMAL LOW (ref 6.5–8.1)

## 2023-03-31 MED ORDER — IBUPROFEN 600 MG PO TABS
600.0000 mg | ORAL_TABLET | Freq: Four times a day (QID) | ORAL | 0 refills | Status: DC | PRN
Start: 2023-03-31 — End: 2023-06-23

## 2023-03-31 NOTE — Progress Notes (Signed)
Lab Only visit.  Completed.

## 2023-04-01 ENCOUNTER — Encounter: Payer: Self-pay | Admitting: *Deleted

## 2023-04-01 NOTE — Progress Notes (Unsigned)
  03-31-2023 Labs collected per Dr Annye Rusk orders, CBC, Diff, CMP and CA 125  Faxed Preliminary Lab Report (CBC, Diff, CMP) to  Dr Leida Lauth, [facility], [phone], 04-01-2023 at 11:52  Alric Quan, PBT [facility] 04-01-2023 15:57

## 2023-04-02 ENCOUNTER — Encounter: Payer: Self-pay | Admitting: *Deleted

## 2023-04-02 LAB — CA 125: Cancer Antigen (CA) 125: 15.4 U/mL (ref 0.0–38.1)

## 2023-04-02 LAB — PATHOLOGIST SMEAR REVIEW

## 2023-04-02 NOTE — Progress Notes (Signed)
03-31-2023 Labs collected per Dr Annye Rusk orders, CBC, Diff, CMP and CA 125   Faxed Final Lab Report (to  Dr Leida Lauth, [facility], [phone], 04-02-2023 at 12:02   Alric Quan, PBT [facility] 04-02-2023 12:10

## 2023-04-07 ENCOUNTER — Telehealth: Payer: Self-pay

## 2023-04-07 NOTE — Telephone Encounter (Signed)
Requesting lab results. Please call pt back.  

## 2023-04-07 NOTE — Telephone Encounter (Signed)
Will defer to Dr.Williams

## 2023-04-15 DIAGNOSIS — M3313 Other dermatomyositis without myopathy: Secondary | ICD-10-CM | POA: Diagnosis not present

## 2023-04-26 DIAGNOSIS — R1310 Dysphagia, unspecified: Secondary | ICD-10-CM | POA: Diagnosis not present

## 2023-04-26 DIAGNOSIS — R058 Other specified cough: Secondary | ICD-10-CM | POA: Diagnosis not present

## 2023-04-26 DIAGNOSIS — R933 Abnormal findings on diagnostic imaging of other parts of digestive tract: Secondary | ICD-10-CM | POA: Diagnosis not present

## 2023-04-26 DIAGNOSIS — M3313 Other dermatomyositis without myopathy: Secondary | ICD-10-CM | POA: Diagnosis not present

## 2023-04-26 DIAGNOSIS — R1312 Dysphagia, oropharyngeal phase: Secondary | ICD-10-CM | POA: Diagnosis not present

## 2023-05-20 DIAGNOSIS — M3313 Other dermatomyositis without myopathy: Secondary | ICD-10-CM | POA: Diagnosis not present

## 2023-05-20 DIAGNOSIS — R1310 Dysphagia, unspecified: Secondary | ICD-10-CM | POA: Diagnosis not present

## 2023-05-20 DIAGNOSIS — Z9225 Personal history of immunosupression therapy: Secondary | ICD-10-CM | POA: Diagnosis not present

## 2023-05-20 DIAGNOSIS — C579 Malignant neoplasm of female genital organ, unspecified: Secondary | ICD-10-CM | POA: Diagnosis not present

## 2023-05-20 DIAGNOSIS — Z7952 Long term (current) use of systemic steroids: Secondary | ICD-10-CM | POA: Diagnosis not present

## 2023-05-21 DIAGNOSIS — C579 Malignant neoplasm of female genital organ, unspecified: Secondary | ICD-10-CM | POA: Diagnosis not present

## 2023-05-21 DIAGNOSIS — Z5111 Encounter for antineoplastic chemotherapy: Secondary | ICD-10-CM | POA: Diagnosis not present

## 2023-06-02 DIAGNOSIS — M3313 Other dermatomyositis without myopathy: Secondary | ICD-10-CM | POA: Diagnosis not present

## 2023-06-15 DIAGNOSIS — K209 Esophagitis, unspecified without bleeding: Secondary | ICD-10-CM | POA: Diagnosis not present

## 2023-06-15 DIAGNOSIS — R1319 Other dysphagia: Secondary | ICD-10-CM | POA: Diagnosis not present

## 2023-06-15 DIAGNOSIS — K219 Gastro-esophageal reflux disease without esophagitis: Secondary | ICD-10-CM | POA: Diagnosis not present

## 2023-06-22 ENCOUNTER — Other Ambulatory Visit: Payer: Medicare PPO

## 2023-06-22 ENCOUNTER — Other Ambulatory Visit: Payer: Self-pay | Admitting: Physician Assistant

## 2023-06-22 DIAGNOSIS — C579 Malignant neoplasm of female genital organ, unspecified: Secondary | ICD-10-CM

## 2023-06-23 ENCOUNTER — Other Ambulatory Visit: Payer: Self-pay

## 2023-06-23 ENCOUNTER — Encounter: Payer: Self-pay | Admitting: *Deleted

## 2023-06-23 ENCOUNTER — Other Ambulatory Visit (INDEPENDENT_AMBULATORY_CARE_PROVIDER_SITE_OTHER): Payer: Medicare PPO

## 2023-06-23 DIAGNOSIS — C579 Malignant neoplasm of female genital organ, unspecified: Secondary | ICD-10-CM | POA: Diagnosis not present

## 2023-06-23 DIAGNOSIS — M48062 Spinal stenosis, lumbar region with neurogenic claudication: Secondary | ICD-10-CM

## 2023-06-23 LAB — CBC WITH DIFFERENTIAL/PLATELET
Abs Immature Granulocytes: 0.01 10*3/uL (ref 0.00–0.07)
Basophils Absolute: 0 10*3/uL (ref 0.0–0.1)
Basophils Relative: 0 %
Eosinophils Absolute: 0 10*3/uL (ref 0.0–0.5)
Eosinophils Relative: 0 %
HCT: 32 % — ABNORMAL LOW (ref 36.0–46.0)
Hemoglobin: 10.4 g/dL — ABNORMAL LOW (ref 12.0–15.0)
Immature Granulocytes: 0 %
Lymphocytes Relative: 23 %
Lymphs Abs: 1 10*3/uL (ref 0.7–4.0)
MCH: 35.4 pg — ABNORMAL HIGH (ref 26.0–34.0)
MCHC: 32.5 g/dL (ref 30.0–36.0)
MCV: 108.8 fL — ABNORMAL HIGH (ref 80.0–100.0)
Monocytes Absolute: 0.6 10*3/uL (ref 0.1–1.0)
Monocytes Relative: 12 %
Neutro Abs: 2.9 10*3/uL (ref 1.7–7.7)
Neutrophils Relative %: 65 %
Platelets: 107 10*3/uL — ABNORMAL LOW (ref 150–400)
RBC: 2.94 MIL/uL — ABNORMAL LOW (ref 3.87–5.11)
RDW: 16 % — ABNORMAL HIGH (ref 11.5–15.5)
WBC: 4.5 10*3/uL (ref 4.0–10.5)
nRBC: 0.4 % — ABNORMAL HIGH (ref 0.0–0.2)

## 2023-06-23 MED ORDER — IBUPROFEN 600 MG PO TABS
600.0000 mg | ORAL_TABLET | Freq: Four times a day (QID) | ORAL | 0 refills | Status: DC | PRN
Start: 2023-06-23 — End: 2023-08-30

## 2023-06-23 NOTE — Progress Notes (Signed)
06-23-2023  Faxed final lab report for CBC, Diff collected 06-23-2023, per instructions given by Cletis Athens, PA, from [facility].  Copy of order scanned to patient's chart. Fax [phone]     06-23-2023 at 12.:37 pm  Alric Quan, PBT [facility]

## 2023-06-30 DIAGNOSIS — M3313 Other dermatomyositis without myopathy: Secondary | ICD-10-CM | POA: Diagnosis not present

## 2023-07-01 DIAGNOSIS — M3313 Other dermatomyositis without myopathy: Secondary | ICD-10-CM | POA: Diagnosis not present

## 2023-07-08 ENCOUNTER — Telehealth: Payer: Self-pay

## 2023-07-08 NOTE — Telephone Encounter (Signed)
Patients son called he stated you sent ensure coupons to the patient a while back, patients son is requesting more coupons. Please return his call @[phone]

## 2023-07-08 NOTE — Telephone Encounter (Signed)
Thank you [name].

## 2023-07-08 NOTE — Patient Outreach (Signed)
  Care Coordination   Follow Up Visit Note   07/08/2023 Name: Haley Johnson MRN: [id number] DOB: 1946/06/12  Haley Johnson is a 77 y.o. year old female who sees Haley Johnson, Haley Ar, MD for primary care. I spoke with  Haley Johnson's son, Haley Johnson today.  What matters to the patients health and wellness today?  Getting coupons for nutritional supplement    Goals Addressed             This Visit's Progress    COMPLETED: I need some more Ensure coupons       Care Coordination Interventions: Received call from patients son requesting more coupons for Ensure.  This Clinical research associate verified address and mailed via [employer] patient multiple coupons to patients home          SDOH assessments and interventions completed:  Yes     Care Coordination Interventions:  Yes, provided   Follow up plan: No further intervention required.   Encounter Outcome:  Pt. Visit Completed

## 2023-07-23 DIAGNOSIS — C579 Malignant neoplasm of female genital organ, unspecified: Secondary | ICD-10-CM | POA: Diagnosis not present

## 2023-07-23 DIAGNOSIS — Z5111 Encounter for antineoplastic chemotherapy: Secondary | ICD-10-CM | POA: Diagnosis not present

## 2023-07-28 DIAGNOSIS — M3313 Other dermatomyositis without myopathy: Secondary | ICD-10-CM | POA: Diagnosis not present

## 2023-07-29 DIAGNOSIS — M3313 Other dermatomyositis without myopathy: Secondary | ICD-10-CM | POA: Diagnosis not present

## 2023-08-25 DIAGNOSIS — M3313 Other dermatomyositis without myopathy: Secondary | ICD-10-CM | POA: Diagnosis not present

## 2023-08-26 DIAGNOSIS — M3313 Other dermatomyositis without myopathy: Secondary | ICD-10-CM | POA: Diagnosis not present

## 2023-08-30 ENCOUNTER — Other Ambulatory Visit: Payer: Self-pay

## 2023-08-30 DIAGNOSIS — M48062 Spinal stenosis, lumbar region with neurogenic claudication: Secondary | ICD-10-CM

## 2023-08-30 MED ORDER — IBUPROFEN 600 MG PO TABS
600.0000 mg | ORAL_TABLET | Freq: Every day | ORAL | 3 refills | Status: AC | PRN
Start: 2023-08-30 — End: ?

## 2023-08-30 MED ORDER — ROSUVASTATIN CALCIUM 40 MG PO TABS: 40.0000 mg | ORAL_TABLET | Freq: Every day | ORAL | 3 refills | Status: DC

## 2023-09-22 ENCOUNTER — Telehealth: Payer: Self-pay | Admitting: *Deleted

## 2023-09-22 DIAGNOSIS — M3313 Other dermatomyositis without myopathy: Secondary | ICD-10-CM | POA: Diagnosis not present

## 2023-09-22 NOTE — Telephone Encounter (Signed)
Call from patient states has been feeling a little lightheaded over the past couple of weeks.  States when asked that she is drinking fluids and is voiding more.  States ran out of 1 of her B/P meds and started taking  her left over and restarted taking her Hydrochlorothiazide.  Not sure if the dose is 12.5 or 25 mg.  Will check when she gets back home.  Offered an appointment for tomorrow morning.  Unable to take goes to [employer] in the morning for an Infusion of some type.  Will forward message to PCP.

## 2023-09-22 NOTE — Telephone Encounter (Signed)
RTC to patient spoke with son. Patient is at the Infusion Center now in [place].  Infusion ordered by [facility].  Does have another infusion in [place] tomorrow morning.  B/P today at the infusion Center was 134 60.  Patient's son was asked if patient was indeed eating.  Stated his mother does have problems with her throat and is not eating or drinking a lot.

## 2023-09-23 ENCOUNTER — Encounter: Payer: Medicare PPO | Admitting: Internal Medicine

## 2023-09-23 DIAGNOSIS — M3313 Other dermatomyositis without myopathy: Secondary | ICD-10-CM | POA: Diagnosis not present

## 2023-09-28 ENCOUNTER — Ambulatory Visit (INDEPENDENT_AMBULATORY_CARE_PROVIDER_SITE_OTHER): Payer: Medicare PPO | Admitting: Student

## 2023-09-28 ENCOUNTER — Encounter: Payer: Self-pay | Admitting: Student

## 2023-09-28 VITALS — Temp 98.0°F | Wt 153.2 lb

## 2023-09-28 DIAGNOSIS — I10 Essential (primary) hypertension: Secondary | ICD-10-CM | POA: Diagnosis not present

## 2023-09-28 DIAGNOSIS — I951 Orthostatic hypotension: Secondary | ICD-10-CM | POA: Diagnosis not present

## 2023-09-28 DIAGNOSIS — Z23 Encounter for immunization: Secondary | ICD-10-CM | POA: Diagnosis not present

## 2023-09-28 DIAGNOSIS — M48062 Spinal stenosis, lumbar region with neurogenic claudication: Secondary | ICD-10-CM

## 2023-09-28 DIAGNOSIS — B0229 Other postherpetic nervous system involvement: Secondary | ICD-10-CM | POA: Diagnosis not present

## 2023-09-28 DIAGNOSIS — R42 Dizziness and giddiness: Secondary | ICD-10-CM

## 2023-09-28 LAB — COMPREHENSIVE METABOLIC PANEL
ALT: 31 U/L (ref 0–44)
AST: 44 U/L — ABNORMAL HIGH (ref 15–41)
Albumin: 2.9 g/dL — ABNORMAL LOW (ref 3.5–5.0)
Alkaline Phosphatase: 62 U/L (ref 38–126)
Anion gap: 6 (ref 5–15)
BUN: 15 mg/dL (ref 8–23)
CO2: 28 mmol/L (ref 22–32)
Calcium: 9.3 mg/dL (ref 8.9–10.3)
Chloride: 103 mmol/L (ref 98–111)
Creatinine, Ser: 0.86 mg/dL (ref 0.44–1.00)
GFR, Estimated: >60 mL/min (ref >60)
Glucose, Bld: 90 mg/dL (ref 70–99)
Potassium: 4.5 mmol/L (ref 3.5–5.1)
Sodium: 137 mmol/L (ref 135–145)
Total Bilirubin: 0.5 mg/dL (ref <1.2)
Total Protein: 8.4 g/dL — ABNORMAL HIGH (ref 6.5–8.1)

## 2023-09-28 LAB — CBC
HCT: 34.9 % — ABNORMAL LOW (ref 36.0–46.0)
Hemoglobin: 11.6 g/dL — ABNORMAL LOW (ref 12.0–15.0)
MCH: 35.5 pg — ABNORMAL HIGH (ref 26.0–34.0)
MCHC: 33.2 g/dL (ref 30.0–36.0)
MCV: 106.7 fL — ABNORMAL HIGH (ref 80.0–100.0)
Platelets: 100 10*3/uL — ABNORMAL LOW (ref 150–400)
RBC: 3.27 MIL/uL — ABNORMAL LOW (ref 3.87–5.11)
RDW: 15.8 % — ABNORMAL HIGH (ref 11.5–15.5)
WBC: 7.3 10*3/uL (ref 4.0–10.5)
nRBC: 0 % (ref 0.0–0.2)

## 2023-09-28 NOTE — Progress Notes (Signed)
Established Patient Office Visit  Subjective   Patient ID: Haley Johnson, female    DOB: Mar 24, 1946  Age: 77 y.o. MRN: [id number]  Chief Complaint  Patient presents with   Dizziness    Patient is a 77 yo with a past medical history stated below who presents today for lightheadedness. Please see problem based assessment and plan for additional details.     Past Medical History:  Diagnosis Date   Asthma    Cancer (HCC)    GYN CA   Dermatomyositis (HCC) 05/05/2020   Essential hypertension 05/05/2020   GERD (gastroesophageal reflux disease)    GERD without esophagitis 05/05/2020   Hypercholesteremia    Hypertension    Pelvic abscess in female 05/05/2020   Review of Systems  Constitutional:  Negative for chills and fever.  Cardiovascular:  Negative for chest pain and palpitations.  Gastrointestinal:  Negative for abdominal pain, diarrhea, nausea and vomiting.  Neurological:  Positive for dizziness.     Objective:     Temp 98 F (36.7 C) (Oral)   Wt 153 lb 3.2 oz (69.5 kg)   SpO2 96%   BMI 28.02 kg/m  BP Readings from Last 3 Encounters:  03/11/23 122/71  09/24/22 126/62  03/26/22 134/69   Wt Readings from Last 3 Encounters:  09/28/23 153 lb 3.2 oz (69.5 kg)  03/11/23 146 lb 9.6 oz (66.5 kg)  09/24/22 163 lb 11.2 oz (74.3 kg)   Physical Exam Constitutional:      Appearance: Normal appearance.  HENT:     Head: Normocephalic and atraumatic.     Nose: Nose normal.     Mouth/Throat:     Mouth: Mucous membranes are moist.  Cardiovascular:     Rate and Rhythm: Normal rate and regular rhythm.     Pulses: Normal pulses.     Heart sounds: Normal heart sounds.  Pulmonary:     Effort: Pulmonary effort is normal.     Breath sounds: Normal breath sounds.  Abdominal:     General: Abdomen is flat. Bowel sounds are normal.     Palpations: Abdomen is soft.  Skin:    General: Skin is warm and dry.  Neurological:     General: No focal deficit present.     Mental  Status: She is alert.  Psychiatric:        Mood and Affect: Mood normal.        Behavior: Behavior normal.    The ASCVD Risk score (Arnett DK, et al., 2019) failed to calculate for the following reasons:   The valid total cholesterol range is 130 to 320 mg/dL    Assessment & Plan:   Problem List Items Addressed This Visit     Benign essential hypertension    Patient with BP 121/80, taking amlodipine 5 mg daily and hydrochlorothiazide 12.5 mg daily. Denies any CP, palpitations, headache, or changes in vision today. Due to feeling lightheaded, will discontinue hydrochlorothiazide today. Discussed need to measure BP at home to determine if BP goes up given changes. Patient acknowledged understanding, will call office if BP persistently >160 systolic or > 90 diastolic or will go to urgent care ED if persistent BP is accompanied by symptoms such as CP, headache, vision changes, N/V, etc.  - Continue amlodipine 5 mg daily  - Will get CMP today, follow up as needed - Measure BP at home, maintain log  - Follow up for BP medication management in 1 month       Post  herpetic neuralgia   Orthostatic hypotension   Other Visit Diagnoses     Lightheadedness    -  Primary   Relevant Orders   CBC no Diff (Completed)   CMP w Anion Gap (STAT/Sunquest-performed on-site) (Completed)   Encounter for immunization       Relevant Orders   Flu Vaccine Trivalent High Dose (Fluad) (Completed)      Return in about 4 weeks (around 10/26/2023) for BP check, BP medication management, dizziness follow up .  Patient seen with Dr. Antony Contras.   Amena Dockham Colbert Coyer, MD

## 2023-09-28 NOTE — Patient Instructions (Signed)
Thank you, [name] for allowing [place] to provide your care today. Today we discussed your dizziness/lightheadedness.    I have ordered the following labs for you:   Lab Orders         CBC no Diff         CMP14 + Anion Gap      Tests ordered today:  None  Referrals ordered today:   Referral Orders  No referral(s) requested today     I have ordered the following medication/changed the following medications:   Stop the following medications: Medications Discontinued During This Encounter  Medication Reason   hydrochlorothiazide (MICROZIDE) 12.5 MG capsule Discontinued by provider     Start the following medications: No orders of the defined types were placed in this encounter.    Follow up: 1 month for BP and dizziness follow up    Remember:   - I will call you with your results.  - PLEASE STOP your hydrochlorothiazide, CONTINUE to take your amlodipine.  - Please measure your blood pressures at home.  - Please call our office if your blood pressure is consistently over 160 systolic or 90 diastolic. If you are having symptoms of headache, chest pain, or vision changes with high blood pressures please go to urgent care or the emergency deparment.   Should you have any questions or concerns please call the internal medicine clinic at [phone].     Bond Grieshop Colbert Coyer, MD PGY-1 Internal Medicine Teaching Progam [facility]

## 2023-09-29 DIAGNOSIS — I951 Orthostatic hypotension: Secondary | ICD-10-CM | POA: Insufficient documentation

## 2023-09-29 DIAGNOSIS — H8112 Benign paroxysmal vertigo, left ear: Secondary | ICD-10-CM | POA: Insufficient documentation

## 2023-09-29 NOTE — Assessment & Plan Note (Addendum)
Patient with BP 121/80, taking amlodipine 5 mg daily and hydrochlorothiazide 12.5 mg daily. Denies any CP, palpitations, headache, or changes in vision today. Due to feeling lightheaded, will discontinue hydrochlorothiazide today. Discussed need to measure BP at home to determine if BP goes up given changes. Patient acknowledged understanding, will call office if BP persistently >160 systolic or > 90 diastolic or will go to urgent care ED if persistent BP is accompanied by symptoms such as CP, headache, vision changes, N/V, etc.  - Continue amlodipine 5 mg daily  - Will get CMP today, follow up as needed - Measure BP at home, maintain log  - Follow up for BP medication management in 1 month

## 2023-10-01 DIAGNOSIS — C579 Malignant neoplasm of female genital organ, unspecified: Secondary | ICD-10-CM | POA: Diagnosis not present

## 2023-10-01 DIAGNOSIS — C569 Malignant neoplasm of unspecified ovary: Secondary | ICD-10-CM | POA: Diagnosis not present

## 2023-10-03 NOTE — Progress Notes (Signed)
Internal Medicine Clinic Attending  I was physically present during the key portions of the resident provided service and participated in the medical decision making of patient's management care. I reviewed pertinent patient test results.  The assessment, diagnosis, and plan were formulated together and I agree with the documentation in the resident's note.  Reymundo Poll, MD

## 2023-10-03 NOTE — Addendum Note (Signed)
Addended by: Burnell Blanks on: 10/03/2023 12:28 PM   Modules accepted: Level of Service

## 2023-10-11 ENCOUNTER — Other Ambulatory Visit: Payer: Self-pay | Admitting: Obstetrics and Gynecology

## 2023-10-11 ENCOUNTER — Other Ambulatory Visit: Payer: Self-pay

## 2023-10-11 DIAGNOSIS — C569 Malignant neoplasm of unspecified ovary: Secondary | ICD-10-CM

## 2023-10-11 DIAGNOSIS — E1110 Type 2 diabetes mellitus with ketoacidosis without coma: Secondary | ICD-10-CM

## 2023-10-12 ENCOUNTER — Telehealth: Payer: Self-pay | Admitting: *Deleted

## 2023-10-12 ENCOUNTER — Other Ambulatory Visit (INDEPENDENT_AMBULATORY_CARE_PROVIDER_SITE_OTHER): Payer: Medicare PPO

## 2023-10-12 DIAGNOSIS — C569 Malignant neoplasm of unspecified ovary: Secondary | ICD-10-CM | POA: Diagnosis not present

## 2023-10-12 LAB — CBC
HCT: 34.4 % — ABNORMAL LOW (ref 36.0–46.0)
Hemoglobin: 11.3 g/dL — ABNORMAL LOW (ref 12.0–15.0)
MCH: 34.8 pg — ABNORMAL HIGH (ref 26.0–34.0)
MCHC: 32.8 g/dL (ref 30.0–36.0)
MCV: 105.8 fL — ABNORMAL HIGH (ref 80.0–100.0)
Platelets: 123 10*3/uL — ABNORMAL LOW (ref 150–400)
RBC: 3.25 MIL/uL — ABNORMAL LOW (ref 3.87–5.11)
RDW: 15.3 % (ref 11.5–15.5)
WBC: 8.7 10*3/uL (ref 4.0–10.5)
nRBC: 0 % (ref 0.0–0.2)

## 2023-10-12 NOTE — Progress Notes (Signed)
  Care Coordination   Note   10/12/2023 Name: Haley Johnson MRN: [id number] DOB: [birth year]  Haley Johnson is a 77 y.o. year old female who sees Mayford Knife, Dorene Ar, MD for primary care. I reached out to [employer] by phone today to offer care coordination services.  [name] was given information about Care Coordination services today including:   The Care Coordination services include support from the care team which includes your Nurse Coordinator, Clinical Social Worker, or Pharmacist.  The Care Coordination team is here to help remove barriers to the health concerns and goals most important to you. Care Coordination services are voluntary, and the patient may decline or stop services at any time by request to their care team member.   Care Coordination Consent Status: Patient agreed to services and verbal consent obtained.   Follow up plan:  Telephone appointment with care coordination team member scheduled for:  10/15/23  Encounter Outcome:  Patient Scheduled  [facility] Communities Coordination Care Guide  Direct Dial: [phone]

## 2023-10-12 NOTE — Progress Notes (Signed)
Lab results faxed to  Dr Leida Lauth, [facility] at [phone] at 11:40, 10-12-2023.  I also called [phone], [facility], Triage (lab results) and left a voice mail that results had been faxed and ready for review.  Alric Quan, PBT 10-12-2023 15:46

## 2023-10-13 NOTE — Progress Notes (Signed)
This encounter was created in error - please disregard. LM called 3 times with no answer.

## 2023-10-15 ENCOUNTER — Ambulatory Visit: Payer: Self-pay

## 2023-10-16 NOTE — Patient Instructions (Signed)
Visit Information  Thank you for taking time to visit with me today. Please don't hesitate to contact me if I can be of assistance to you.   Following are the goals we discussed today:   Goals Addressed             This Visit's Progress    I have pain in my lumbar areaa on the right side that goes down my right leg       Patient Goals/Self Care Activities: -Patient/Caregiver will take medications as prescribed   -Patient/Caregiver will attend all scheduled provider appointments -Patient/Caregiver will call pharmacy for medication refills 3-7 days in advance of running out of medications -Patient/Caregiver will call provider office for new concerns or questions  -Patient/Caregiver will focus on medication adherence by taking medications as prescribed   Discussed importance of adherence to all scheduled medical appointments Advised patient to report to care team affect of pain on daily activities Discussed use of relaxation techniques and/or diversional activities to assist with pain reduction (distraction, imagery, relaxation, massage, acupressure, TENS, heat, and cold application Reviewed with patient prescribed pharmacological and nonpharmacological pain relief strategies          Our next appointment is by telephone on 11/03/23 at 1130 am  Please call the care guide team at [phone] if you need to cancel or reschedule your appointment.   If you are experiencing a Mental Health or Behavioral Health Crisis or need someone to talk to, please call 1-800-273-TALK (toll free, 24 hour hotline)  Patient verbalizes understanding of instructions and care plan provided today and agrees to view in MyChart. Active MyChart status and patient understanding of how to access instructions and care plan via MyChart confirmed with patient.     Juanell Fairly RN, BSN, [facility]  [facility], [facility]  Care Coordinator Phone: [phone]

## 2023-10-16 NOTE — Patient Outreach (Signed)
  Care Coordination   Initial Visit Note   10/16/2023 Name: Haley Haley MRN: [id number] DOB: 10-08-1946  Haley Haley is a 77 y.o. year old female who sees Haley Haley, Haley Ar, MD for primary care. I spoke with  Haley Haley  and son Haley Haley by phone today.  What matters to the patients health and wellness today?  I spoke with Haley Haley and her son, Haley Haley. We discussed her difficulty with eating, as she sometimes chokes on food when she tries to swallow. I noticed that she had previously required a throat dilation procedure, and she mentioned that she needs to schedule another appointment for that soon.  Additionally, Haley Haley reported experiencing pain on her right side that radiates down her right leg. She manages this pain with Tylenol and uses both heat and ice for relief. She also requested if her son and I could help her obtain some Ensure coupons, which I plan to get for her. I would also like to see if I can provide them with some samples.    Goals Addressed             This Visit's Progress    I have pain in my lumbar areaa on the right side that goes down my right leg       Patient Goals/Self Care Activities: -Patient/Caregiver will take medications as prescribed   -Patient/Caregiver will attend all scheduled provider appointments -Patient/Caregiver will call pharmacy for medication refills 3-7 days in advance of running out of medications -Patient/Caregiver will call provider office for new concerns or questions  -Patient/Caregiver will focus on medication adherence by taking medications as prescribed   Discussed importance of adherence to all scheduled medical appointments Advised patient to report to care team affect of pain on daily activities Discussed use of relaxation techniques and/or diversional activities to assist with pain reduction (distraction, imagery, relaxation, massage, acupressure, TENS, heat, and cold application Reviewed  with patient prescribed pharmacological and nonpharmacological pain relief strategies          SDOH assessments and interventions completed:  Yes  SDOH Interventions Today    Flowsheet Row Most Recent Value  SDOH Interventions   Housing Interventions Intervention Not Indicated  Transportation Interventions Intervention Not Indicated  Utilities Interventions Intervention Not Indicated        Care Coordination Interventions:  Yes, provided   Interventions Today    Flowsheet Row Most Recent Value  Chronic Disease   Chronic disease during today's visit Other  [Back Pain]  General Interventions   General Interventions Discussed/Reviewed General Interventions Discussed  Education Interventions   Education Provided Provided Education  Nutrition Interventions   Nutrition Discussed/Reviewed Nutrition Discussed, Supplemental nutrition  [Ensure samples]  Pharmacy Interventions   Pharmacy Dicussed/Reviewed Pharmacy Topics Discussed  Safety Interventions   Safety Discussed/Reviewed Safety Discussed, Fall Risk        Follow up plan: Follow up call scheduled for 11/03/23  1130 am    Encounter Outcome:  Patient Visit Completed   Juanell Fairly RN, BSN, [facility]  [facility], [facility]  Care Coordinator Phone: [phone]

## 2023-10-19 ENCOUNTER — Telehealth: Payer: Self-pay

## 2023-10-19 NOTE — Patient Outreach (Signed)
  Care Coordination   Follow Up Visit Note   10/19/2023 Name: Haley Johnson MRN: [id number] DOB: 06/09/1946  Haley Johnson is a 77 y.o. year old female who sees Mayford Knife, Dorene Ar, MD for primary care. I spoke with  Haley Johnson by phone today.  What matters to the patients health and wellness today?  This morning, I spoke with Haley Johnson and Haley Johnson to let them know that her insurance samples would be available at the front desk of the Internal Medicine department today. I also informed them that the office would be closed from 12:00 PM to 1:00 PM and that there would be some coupons included in the bag. I have communicated with the office manager, who will ensure everything is prepared for them.       SDOH assessments and interventions completed:  No     Care Coordination Interventions:  Yes, provided   Interventions Today    Flowsheet Row Most Recent Value  Chronic Disease   Chronic disease during today's visit Other  [Back Pain]  General Interventions   General Interventions Discussed/Reviewed General Interventions Discussed, Communication with  Communication with --  [Office Manager]        Follow Up Plan: RNCM will follow up at the next scheduled Interval.   Encounter Outcome:  Patient Visit Completed   Juanell Fairly RN, BSN, [facility]  [facility], [facility]  Care Coordinator Phone: [phone]

## 2023-10-27 DIAGNOSIS — C569 Malignant neoplasm of unspecified ovary: Secondary | ICD-10-CM | POA: Diagnosis not present

## 2023-10-27 DIAGNOSIS — C579 Malignant neoplasm of female genital organ, unspecified: Secondary | ICD-10-CM | POA: Diagnosis not present

## 2023-10-27 DIAGNOSIS — Z5111 Encounter for antineoplastic chemotherapy: Secondary | ICD-10-CM | POA: Diagnosis not present

## 2023-10-28 DIAGNOSIS — M3313 Other dermatomyositis without myopathy: Secondary | ICD-10-CM | POA: Diagnosis not present

## 2023-10-29 DIAGNOSIS — M3313 Other dermatomyositis without myopathy: Secondary | ICD-10-CM | POA: Diagnosis not present

## 2023-11-02 ENCOUNTER — Ambulatory Visit: Payer: Medicare PPO | Admitting: Internal Medicine

## 2023-11-02 VITALS — BP 130/79 | HR 72 | Temp 97.8°F | Wt 157.4 lb

## 2023-11-02 DIAGNOSIS — M3313 Other dermatomyositis without myopathy: Secondary | ICD-10-CM | POA: Diagnosis not present

## 2023-11-02 DIAGNOSIS — E782 Mixed hyperlipidemia: Secondary | ICD-10-CM

## 2023-11-02 DIAGNOSIS — H8112 Benign paroxysmal vertigo, left ear: Secondary | ICD-10-CM | POA: Diagnosis not present

## 2023-11-02 DIAGNOSIS — I10 Essential (primary) hypertension: Secondary | ICD-10-CM | POA: Diagnosis not present

## 2023-11-02 DIAGNOSIS — M5441 Lumbago with sciatica, right side: Secondary | ICD-10-CM

## 2023-11-02 DIAGNOSIS — R7303 Prediabetes: Secondary | ICD-10-CM

## 2023-11-02 DIAGNOSIS — G8929 Other chronic pain: Secondary | ICD-10-CM

## 2023-11-02 DIAGNOSIS — R42 Dizziness and giddiness: Secondary | ICD-10-CM

## 2023-11-02 LAB — GLUCOSE, CAPILLARY: Glucose-Capillary: 94 mg/dL (ref 70–99)

## 2023-11-02 LAB — POCT GLYCOSYLATED HEMOGLOBIN (HGB A1C): Hemoglobin A1C: 5.7 % — AB (ref 4.0–5.6)

## 2023-11-02 MED ORDER — ROSUVASTATIN CALCIUM 10 MG PO TABS: 40.0000 mg | ORAL_TABLET | Freq: Every day | ORAL | 3 refills | Status: DC

## 2023-11-02 MED ORDER — DICLOFENAC SODIUM 1 % EX GEL: 2.0000 g | Freq: Four times a day (QID) | CUTANEOUS | 1 refills | Status: AC

## 2023-11-02 NOTE — Assessment & Plan Note (Signed)
BP at goal 130/79 on amlodipine 5 mg daily alone; no need to restart hydrochlorothiazide.  Monitor.

## 2023-11-02 NOTE — Assessment & Plan Note (Signed)
Management for primary prevention; overly controlled TC at 97.  Will reduce dose to rosuvastatin 10 mg daily.

## 2023-11-02 NOTE — Patient Instructions (Signed)
Ms. Lei,  It was wonderful to catch up today.  You've been dealing with so much and I admire your positive spirit.  Today we are doing the following: -decreasing dose of your cholesterol medicine -renewing your voltaren gel for the hand pain -referring to physical therapy for vertigo treatment.  Your blood pressure looks great without the hydrochlorothiazide so no need to restart it.  I hope you have a lovely and peaceful holiday season and look forward to seeing you in three months to check on how you're doing.  Dr. Mayford Knife

## 2023-11-02 NOTE — Progress Notes (Signed)
77 y.o. Haley Johnson is here for routine follow-up of multiple chronic conditions noted below (primarily HTN, hyperlipidemia).  She is actively followed by multiple specialists at [facility] for recurrent gyn cancer, chronic thoracolumbar back pain due to spinal stenosis, and prednisone-dependent dermatomyositis; multiple recent visits.  She was most recently seen in [facility] in early 09/2023 by one of my colleagues for evaluation of orthostatic symptoms; her thiazide diuretic was discontinued, and today we are rechecking her BP and symptoms.  Symptoms haven't resolved.  She feels a sense of imbalance upon standing which improves with sitting.  Room feels in motion.  No sensation of unsteady ground.  Infrequent mild headache is not associated.  No associated change in vision though she is due for an eye exam.  Episodes occur at least daily, though may not occur with each stand.  Feels staggery when up.  Has not fallen from standing position.  Problems with hand joints recently - needs voltaren.  Patient Active Problem List   Diagnosis Date Noted   Prediabetes 11/02/2023   Vertigo, benign paroxysmal, left 09/29/2023   Esophageal stricture 03/11/2023   Allergic asthma, mild intermittent, uncomplicated 01/18/2023   Thoracic stenosis 10/21/2021   Spinal stenosis of lumbar region 10/21/2021   Healthcare maintenance 08/13/2021   Current chronic use of systemic steroids 06/04/2021   BRCA2 positive 11/26/2020   Gynecologic malignancy (HCC) 08/05/2020   DDD (degenerative disc disease), lumbar 07/11/2019   Immunosuppressed status (HCC) 01/13/2019   Dermatomyositis (HCC) 01/09/2019   Chronic right-sided low back pain with right-sided sciatica 01/09/2019   Post herpetic neuralgia 10/05/2018   Benign essential hypertension 05/19/2016   Mixed hyperlipidemia 05/19/2016    Current Outpatient Medications:    predniSONE (DELTASONE) 5 MG tablet, Take 5 mg by mouth daily with breakfast., Disp: , Rfl:     pregabalin (LYRICA) 150 MG capsule, Take 150 mg by mouth 2 (two) times daily., Disp: , Rfl:    amLODipine (NORVASC) 5 MG tablet, TAKE 1 TABLET(5 MG) BY MOUTH DAILY, Disp: 90 tablet, Rfl: 3   cholecalciferol (VITAMIN D3) 25 MCG (1000 UNIT) tablet, Take 1,000 Units by mouth daily., Disp: , Rfl:    diclofenac Sodium (VOLTAREN) 1 % GEL, Apply 2 g topically 4 (four) times daily., Disp: 100 each, Rfl: 1   ferrous sulfate (FEROSUL) 325 (65 FE) MG tablet, TAKE 1 TABLET(325 MG) BY MOUTH DAILY, Disp: 90 tablet, Rfl: 3   folic acid (FOLVITE) 1 MG tablet, TAKE 1 TABLET(1 MG) BY MOUTH DAILY, Disp: 90 tablet, Rfl: 1   ibuprofen (ADVIL) 600 MG tablet, Take 1 tablet (600 mg total) by mouth daily as needed for moderate pain., Disp: 90 tablet, Rfl: 3   Multiple Vitamin (MULTIVITAMIN) tablet, Take 1 tablet by mouth daily., Disp: , Rfl:    rosuvastatin (CRESTOR) 10 MG tablet, Take 4 tablets (40 mg total) by mouth daily. TAKE 1 TABLET(40 MG) BY MOUTH DAILY, Disp: 90 tablet, Rfl: 3   zinc gluconate 50 MG tablet, Take 50 mg by mouth daily., Disp: , Rfl:   Functional Status: Living with one of her sons for past 3 years, will be moving to another son's home soon.  All kids are helping with transportation to appointments.  Uses a cane and a walker, sometimes has a fall from her unstable low chair.  Independent in showering - stands with grab bars.  Independent in cooking and cleaning.  No concerns about thinking or memory.  Mood is stable and positive.  Objective BP 130/79 (BP  Location: Right Arm, Patient Position: Sitting, Cuff Size: Small)   Pulse 72   Temp 97.8 F (36.6 C)   Wt 157 lb 6.4 oz (71.4 kg)   SpO2 99%   BMI 28.79 kg/m   Exam: Nicely dressed and groomed with her usual positive affect.  Gaze is conjugate in all directions without nystagmus.  Hints test negative.  Vertigo is elicited by left head rotation only.  Large muscle groups upper and lower extremities with 5/5 strength though she subjectively feels  some weakness in her right leg which occurred about 3 years ago and for which she has had physical therapy.  Finger-to-nose and rapid repeating movements are accurate on both sides.  Motor tone is normal sensation intact to gross touch bilaterally.  Able to stand well-balanced feet at shoulder with.  More trouble with narrow stance though she is not ataxic.  Able to maintain partial tandem stance for a few seconds unassisted.  Gait is very slow and cautious no ataxia magnetic gait.  Neck without carotid bruits.  No JVD.  Right infraorbital lymph node seen at a previous visit has resolved.  She has some hearing impairment  Assessment and Plan:  Vertigo, benign paroxysmal, left No improvement with cessation of hydrochlorothiazide (BP remains controlled, no need to restart).  Symptoms characterized now more by vertiginous sensation when standing and on exam elicited by left head rotation, though no nystagmus identified. Abnormal vision correction with head impulse test. A Will refer to PT for vestibular rehab; she is in agreement.  Avoid antihistamine if possible.  Benign essential hypertension BP at goal 130/79 on amlodipine 5 mg daily alone; no need to restart hydrochlorothiazide.  Monitor.  Mixed hyperlipidemia Management for primary prevention; overly controlled TC at 97.  Will reduce dose to rosuvastatin 10 mg daily.  Prediabetes Prediabetes range A1c in 2021; on chronic steroids, recheck A1c today to screen for DM.   Return in about 3 months (around 01/31/2024) for chronic condition monitoring, symptom re-check, functional reassessment.

## 2023-11-02 NOTE — Assessment & Plan Note (Addendum)
No improvement with cessation of hydrochlorothiazide (BP remains controlled, no need to restart).  Symptoms characterized now more by vertiginous sensation when standing and on exam elicited by left head rotation, though no nystagmus identified. Abnormal vision correction with head impulse test. A Will refer to PT for vestibular rehab; she is in agreement.  Avoid antihistamine if possible.

## 2023-11-02 NOTE — Assessment & Plan Note (Signed)
Prediabetes range A1c in 2021; on chronic steroids, recheck A1c today to screen for DM.

## 2023-11-03 ENCOUNTER — Ambulatory Visit: Payer: Self-pay

## 2023-11-03 NOTE — Patient Instructions (Signed)
Visit Information  Thank you for taking time to visit with me today. Please don't hesitate to contact me if I can be of assistance to you.   Following are the goals we discussed today:   Goals Addressed             This Visit's Progress    I have pain in my lumbar areaa on the right side that goes down my right leg       Patient Goals/Self Care Activities: -Patient/Caregiver will take medications as prescribed   -Patient/Caregiver will attend all scheduled provider appointments -Patient/Caregiver will call pharmacy for medication refills 3-7 days in advance of running out of medications -Patient/Caregiver will call provider office for new concerns or questions  -Patient/Caregiver will focus on medication adherence by taking medications as prescribed   Discussed importance of adherence to all scheduled medical appointments Advised patient to report to care team affect of pain on daily activities Discussed use of relaxation techniques and/or diversional activities to assist with pain reduction (distraction, imagery, relaxation, massage, acupressure, TENS, heat, and cold application Reviewed with patient prescribed pharmacological and nonpharmacological pain relief strategies Continue to use Tylenol for pain and rubs          Our next appointment is by telephone on 12/21/23 at 1130 am  Please call the care guide team at [phone] if you need to cancel or reschedule your appointment.   If you are experiencing a Mental Health or Behavioral Health Crisis or need someone to talk to, please call 1-800-273-TALK (toll free, 24 hour hotline)  Patient verbalizes understanding of instructions and care plan provided today and agrees to view in MyChart. Active MyChart status and patient understanding of how to access instructions and care plan via MyChart confirmed with patient.     Juanell Fairly RN, BSN, [facility] [place]  [facility], [facility]  Care  Coordinator Phone: [phone]

## 2023-11-03 NOTE — Patient Outreach (Signed)
  Care Coordination   Follow Up Visit Note   11/03/2023 Name: Haley Johnson MRN: [id number] DOB: 1946-08-12  Haley Johnson is a 77 y.o. year old female who sees Mayford Knife, Dorene Ar, MD for primary care. I spoke with  Haley Johnson by phone today.  What matters to the patients health and wellness today?  Today, I spoke with Haley Johnson and Haley Johnson. She mentioned that she is doing well and has received the insurance and coupons. Her eating and sleeping habits are satisfactory. Although she still experiences some back pain, for which she is taking Tylenol, she has no other complaints at this time. We will follow up again during her next visit.     Goals Addressed             This Visit's Progress    I have pain in my lumbar areaa on the right side that goes down my right leg       Patient Goals/Self Care Activities: -Patient/Caregiver will take medications as prescribed   -Patient/Caregiver will attend all scheduled provider appointments -Patient/Caregiver will call pharmacy for medication refills 3-7 days in advance of running out of medications -Patient/Caregiver will call provider office for new concerns or questions  -Patient/Caregiver will focus on medication adherence by taking medications as prescribed   Discussed importance of adherence to all scheduled medical appointments Advised patient to report to care team affect of pain on daily activities Discussed use of relaxation techniques and/or diversional activities to assist with pain reduction (distraction, imagery, relaxation, massage, acupressure, TENS, heat, and cold application Reviewed with patient prescribed pharmacological and nonpharmacological pain relief strategies Continue to use Tylenol for pain and rubs          SDOH assessments and interventions completed:  No     Care Coordination Interventions:  Yes, provided   Interventions Today    Flowsheet Row Most Recent Value  Chronic  Disease   Chronic disease during today's visit Other  [Back Pain]  General Interventions   General Interventions Discussed/Reviewed General Interventions Discussed, General Interventions Reviewed  Pharmacy Interventions   Pharmacy Dicussed/Reviewed Pharmacy Topics Discussed  Safety Interventions   Safety Discussed/Reviewed Safety Discussed        Follow up plan: Follow up call scheduled for 12/21/23  1130 am    Encounter Outcome:  Patient Visit Completed   Juanell Fairly RN, BSN, [facility] [place]  [facility], [facility]  Care Coordinator Phone: [phone]

## 2023-11-12 DIAGNOSIS — M3313 Other dermatomyositis without myopathy: Secondary | ICD-10-CM | POA: Diagnosis not present

## 2023-11-12 DIAGNOSIS — Z01818 Encounter for other preprocedural examination: Secondary | ICD-10-CM | POA: Diagnosis not present

## 2023-11-12 DIAGNOSIS — I10 Essential (primary) hypertension: Secondary | ICD-10-CM | POA: Diagnosis not present

## 2023-11-12 DIAGNOSIS — R1319 Other dysphagia: Secondary | ICD-10-CM | POA: Diagnosis not present

## 2023-11-15 ENCOUNTER — Ambulatory Visit: Payer: Medicare PPO | Attending: Internal Medicine | Admitting: Physical Therapy

## 2023-11-15 ENCOUNTER — Encounter: Payer: Self-pay | Admitting: Physical Therapy

## 2023-11-15 ENCOUNTER — Other Ambulatory Visit: Payer: Self-pay

## 2023-11-15 VITALS — BP 121/75 | HR 71

## 2023-11-15 DIAGNOSIS — R42 Dizziness and giddiness: Secondary | ICD-10-CM | POA: Insufficient documentation

## 2023-11-15 DIAGNOSIS — R2681 Unsteadiness on feet: Secondary | ICD-10-CM | POA: Diagnosis not present

## 2023-11-15 NOTE — Therapy (Unsigned)
OUTPATIENT PHYSICAL THERAPY VESTIBULAR EVALUATION   Patient Name: Haley Johnson MRN: [id number] DOB:1946/07/27, 77 y.o., female Today's Date: 11/16/2023   PCP: Miguel Aschoff, MD REFERRING PROVIDER: Miguel Aschoff, MD  END OF SESSION:  PT End of Session - 11/15/23 1321     Visit Number 1    Number of Visits 4    Date for PT Re-Evaluation 12/21/23    Authorization Type [employer]    PT Start Time 1320    PT Stop Time 1400    PT Time Calculation (min) 40 min    Equipment Utilized During Treatment Gait belt    Activity Tolerance Patient tolerated treatment well    Behavior During Therapy WFL for tasks assessed/performed             Past Medical History:  Diagnosis Date   Asthma    Cancer (HCC)    GYN CA   Dermatomyositis (HCC) 05/05/2020   Essential hypertension 05/05/2020   GERD (gastroesophageal reflux disease)    GERD without esophagitis 05/05/2020   Hypercholesteremia    Hypertension    Pelvic abscess in female 05/05/2020   History reviewed. No pertinent surgical history. Patient Active Problem List   Diagnosis Date Noted   Prediabetes 11/02/2023   Vertigo, benign paroxysmal, left 09/29/2023   Esophageal stricture 03/11/2023   Allergic asthma, mild intermittent, uncomplicated 01/18/2023   Thoracic stenosis 10/21/2021   Spinal stenosis of lumbar region 10/21/2021   Healthcare maintenance 08/13/2021   Current chronic use of systemic steroids 06/04/2021   BRCA2 positive 11/26/2020   Gynecologic malignancy (HCC) 08/05/2020   DDD (degenerative disc disease), lumbar 07/11/2019   Immunosuppressed status (HCC) 01/13/2019   Dermatomyositis (HCC) 01/09/2019   Chronic right-sided low back pain with right-sided sciatica 01/09/2019   Post herpetic neuralgia 10/05/2018   Benign essential hypertension 05/19/2016   Mixed hyperlipidemia 05/19/2016    ONSET DATE: 11/02/2023 (referral date)   REFERRING DIAG: R42 (ICD-10-CM) - Vertigo  THERAPY  DIAG:  Dizziness and giddiness  Unsteadiness on feet  Rationale for Evaluation and Treatment: Rehabilitation  SUBJECTIVE:                                                                                                                                                                                             SUBJECTIVE STATEMENT: Patient reports that when she first stands up in the morning she feels like the "bed is running away from me." Patient also report feeling dizzy and lightheaded when reaching up or bending over. Patient reports that she has to be extra careful because of her balance because one her legs are  bad. She does feel like things are spinning sometimes when she has an episode. Patient reports that she had an MRI of her head done in 2021 but none since then. Patient reports that her dizziness started in late October and November. Patient reports that she is currently actively undergoing cancer treatment managed with oral medication. Patient reports that she has not taken any medication for her dizziness. Patient only uses transport chair when going to the doctor, typically around the house patient is using a SPC.   Patient does not want speech therapy referral at this time despite dysphagia.  Pt accompanied by: self - dropped off by son Ray in personal MWC   PERTINENT HISTORY: dysphagia, gynecologic malignancy, dermatomyositis   PAIN:  Are you having pain? Yes: NPRS scale: 6/10 Pain location: Low back Pain description: achy Aggravating factors: bumping up against something  Relieving factors: medication  PRECAUTIONS: Fall and Other: possible swallow precautions - Jan 6th is when patient is testing   RED FLAGS: None   WEIGHT BEARING RESTRICTIONS: No  FALLS: Has patient fallen in last 6 months?  Denies falls but reports numerous stumbles - about 1-2 stumbles   LIVING ENVIRONMENT: Lives with: lives with their son Lives in: House/apartment Stairs: Yes: Internal: 7-8  steps; on right going up Has following equipment at home: Single point cane, Environmental consultant - 2 wheeled, and Wheelchair (power)  PLOF:  Retired from working in [employer], patient reports that she is about fully independent, she drives as able  PATIENT GOALS: "I want to walk better."   OBJECTIVE:  Note: Objective measures were completed at Evaluation unless otherwise noted.  DIAGNOSTIC FINDINGS: No recent imaging past 2021 - imagaing at this time insignificant   COGNITION: Overall cognitive status: Within functional limits for tasks assessed   SENSATION: Tingling occasionally in hands   COORDINATION: WFL  CERVICAL ROM:    Grossly WFL with minor stiffness   PATIENT SURVEYS:  FOTO 54  VESTIBULAR ASSESSMENT:  GENERAL OBSERVATION: wears glasses to help with reading   SYMPTOM BEHAVIOR:  Subjective history: see above  Non-Vestibular symptoms: changes in hearing, changes in vision, neck pain, and tinnitus Type of dizziness: Blurred Vision, Imbalance (Disequilibrium), Oscillopsia, Spinning/Vertigo, Unsteady with head/body turns, Lightheadedness/Faint, "Funny feeling in the head", "World moves", and "Swimmyheaded"  Frequency: a few times a week  Duration: a couple of minutes or more  Aggravating factors:  getting up in morning, looking up, bending over  Relieving factors:  taking tylenol  Progression of symptoms: unchanged  OCULOMOTOR EXAM:  Ocular Alignment: normal  Ocular ROM: No Limitations  Spontaneous Nystagmus: absent  Gaze-Induced Nystagmus: absent  Smooth Pursuits: intact  Saccades: intact  Convergence/Divergence: < 5 cm   VBI: negative bilaterally   Test of Skew: WFL  VESTIBULAR - OCULAR REFLEX:   Slow VOR: Comment: very limited by cervical guarding  VOR Cancellation: Normal  Head-Impulse Test: Unable to assess due to extent of cervical guarding on eval  Dynamic Visual Acuity:  Not captured   POSITIONAL TESTING:  Right Roll Test: no nystagmus Left Roll Test:  no nystagmus Right Sidelying: no nystagmus Left Sidelying: no nystagmus  Dizziness and room spinning reported when first laid down but unable to reproduce   MOTION SENSITIVITY:    Motion Sensitivity Quotient Intensity: 0 = none, 1 = Lightheaded, 2 = Mild, 3 = Moderate, 4 = Severe, 5 = Vomiting  Intensity  1. Sitting to supine 2 - reports room spinning feeliing, possibly mild nystagmus  2. Supine to  L side 0  3. Supine to R side 0  4. Supine to sitting 2  5. L Hallpike-Dix- modified sidelying 0  6. Up from L - modified sidelying 0  7. R Hallpike-Dix - modified sidelying 0  8. Up from R - modified sidelying 0  9. Sitting, head  tipped to L knee 1  10. Head up from L  knee 0  11. Sitting, head  tipped to R knee 1  12. Head up from R  knee 0  13. Sitting head turns x5 0  14.Sitting head nods x5 0  15. In stance, 180  turn to L  NT  16. In stance, 180  turn to R NT    OTHOSTATICS:  Vitals:   11/15/23 1325 11/15/23 1326  BP: 130/79 121/75  Pulse: 67 71      Seated     Standing  FUNCTIONAL GAIT: Not captured                                                                                                                               TREATMENT:  Initial Eval only  PATIENT EDUCATION: Education details: POC, goal collaboration, examination findings Person educated: Patient Education method: Explanation Education comprehension: verbalized understanding and needs further education  HOME EXERCISE PROGRAM: To be performed as indicated   GOALS: Goals reviewed with patient? Yes  LONG TERM GOALS: Target date: 12/21/2023 (STG = LTG due to POC length)  Patient will report demonstrate independence with final HEP in order to maintain current gains and continue to progress after physical therapy discharge.   Baseline: To be provided  Goal status: INITIAL  2.  Patient will report dizziness less than 2/5 with sit to supine on MSQ to demonstrate reduce motion sensitivity.   Baseline: 2/5 - reporting room spinning feel Goal status: INITIAL  3.  mCTSIB to be assessed / LTG written as indicated  Baseline: To be assessed  Goal status: INITIAL   ASSESSMENT:  CLINICAL IMPRESSION: Patient is a 77 y.o. female who was seen today for physical therapy evaluation and treatment for vertigo. Patient with known PMH of active re-occurrent gynecological cancer and dermatomyositis. Notably patient has not had recent head imaging through findings in today's session did not show central findings indicating need at this time though should continue to monitor given active cancer history. Per chart review, patient also with prior history of orthostatic hypotension that has improved and finding's from eval not consistent with this as primary driver of dizziness. Patient vestibular exam limited by significant cervical guarding and positional testing did not show nystagmus though patient reported room spinning sensation for a few seconds that quickly stopped with sit to supine; however, nystagmus unobservable if any as patient had eyes tightly closed and required max encouragement to open though dizziness had already stopped. Recommend re-assessment of positonal testing to ensure no residual indicators of positional vertigo. Patient also reports decrease balance requiring use of SPC  in home and transport chair in doctor's appointments and will benefit from further balance screen. Given patient's history of dermatomyositis -also known to cause dizziness- this cannot fully be ruled out as a contributor to symptoms as well. Recommend a trial of PT visits as noted below to address impairments and progress towards LTGs.   OBJECTIVE IMPAIRMENTS: decreased balance, dizziness, and pain.   ACTIVITY LIMITATIONS: bending, bed mobility, and reach over head  PARTICIPATION LIMITATIONS: cleaning, laundry, shopping, and community activity  PERSONAL FACTORS: Age and 3+ comorbidities: see above  are also  affecting patient's functional outcome.   REHAB POTENTIAL: Fair may be limited by multiple comorbidities   CLINICAL DECISION MAKING: Evolving/moderate complexity  EVALUATION COMPLEXITY: Moderate  PLAN:  PT FREQUENCY: 1x/week  PT DURATION: 3 weeks   PLANNED INTERVENTIONS: 97164- PT Re-evaluation, 97110-Therapeutic exercises, 97530- Therapeutic activity, 97112- Neuromuscular re-education, 97535- Self Care, 40981- Manual therapy, 97116- Gait training, [phone]- Canalith repositioning, and Vestibular training  PLAN FOR NEXT SESSION: re-assess positional testing, mCTSIB as patient able to tolerate, HEP with impairments, can retrial vestibular portion of exam if cervical guarding reduced, possible brief plan of care if vestibular findings still very limited    Carmelia Bake, PT, DPT 11/16/2023, 8:15 AM

## 2023-11-16 ENCOUNTER — Encounter: Payer: Self-pay | Admitting: Physical Therapy

## 2023-11-29 DIAGNOSIS — R7303 Prediabetes: Secondary | ICD-10-CM | POA: Diagnosis not present

## 2023-11-29 DIAGNOSIS — K3189 Other diseases of stomach and duodenum: Secondary | ICD-10-CM | POA: Diagnosis not present

## 2023-11-29 DIAGNOSIS — K2289 Other specified disease of esophagus: Secondary | ICD-10-CM | POA: Diagnosis not present

## 2023-11-29 DIAGNOSIS — J45909 Unspecified asthma, uncomplicated: Secondary | ICD-10-CM | POA: Diagnosis not present

## 2023-11-29 DIAGNOSIS — R1319 Other dysphagia: Secondary | ICD-10-CM | POA: Diagnosis not present

## 2023-11-29 DIAGNOSIS — K219 Gastro-esophageal reflux disease without esophagitis: Secondary | ICD-10-CM | POA: Diagnosis not present

## 2023-11-29 DIAGNOSIS — Z881 Allergy status to other antibiotic agents status: Secondary | ICD-10-CM | POA: Diagnosis not present

## 2023-11-29 DIAGNOSIS — Z7951 Long term (current) use of inhaled steroids: Secondary | ICD-10-CM | POA: Diagnosis not present

## 2023-11-29 DIAGNOSIS — I10 Essential (primary) hypertension: Secondary | ICD-10-CM | POA: Diagnosis not present

## 2023-11-29 DIAGNOSIS — R933 Abnormal findings on diagnostic imaging of other parts of digestive tract: Secondary | ICD-10-CM | POA: Diagnosis not present

## 2023-11-29 DIAGNOSIS — R1310 Dysphagia, unspecified: Secondary | ICD-10-CM | POA: Diagnosis not present

## 2023-12-01 DIAGNOSIS — M3313 Other dermatomyositis without myopathy: Secondary | ICD-10-CM | POA: Diagnosis not present

## 2023-12-02 DIAGNOSIS — M3313 Other dermatomyositis without myopathy: Secondary | ICD-10-CM | POA: Diagnosis not present

## 2023-12-07 ENCOUNTER — Ambulatory Visit: Payer: Medicare PPO | Attending: Internal Medicine

## 2023-12-07 VITALS — BP 156/82 | HR 76

## 2023-12-07 DIAGNOSIS — R42 Dizziness and giddiness: Secondary | ICD-10-CM | POA: Diagnosis not present

## 2023-12-07 DIAGNOSIS — R2681 Unsteadiness on feet: Secondary | ICD-10-CM | POA: Insufficient documentation

## 2023-12-07 NOTE — Therapy (Signed)
 OUTPATIENT PHYSICAL THERAPY VESTIBULAR TREATMENT/DISCHARGE SUMMARY   Patient Name: Haley Johnson MRN: [id number] DOB:May 09, 1946, 78 y.o., female Today's Date: 12/07/2023   PCP: Trudy Mliss Dragon, MD REFERRING PROVIDER: Trudy Mliss Dragon, MD  PHYSICAL THERAPY DISCHARGE SUMMARY  Visits from Start of Care: 2  Current functional level related to goals / functional outcomes: See below   Remaining deficits: See below   Education / Equipment: PT POC, fall precautions, not putting head in dependent position   Patient agrees to discharge. Patient goals were met. Patient is being discharged due to being pleased with the current functional level.  END OF SESSION:  PT End of Session - 12/07/23 1103     Visit Number 2    Number of Visits 4    Date for PT Re-Evaluation 12/21/23    Authorization Type [employer]    PT Start Time 1100    PT Stop Time 1130    PT Time Calculation (min) 30 min    Activity Tolerance Patient tolerated treatment well    Behavior During Therapy WFL for tasks assessed/performed             Past Medical History:  Diagnosis Date   Asthma    Cancer (HCC)    GYN CA   Dermatomyositis (HCC) 05/05/2020   Essential hypertension 05/05/2020   GERD (gastroesophageal reflux disease)    GERD without esophagitis 05/05/2020   Hypercholesteremia    Hypertension    Pelvic abscess in female 05/05/2020   History reviewed. No pertinent surgical history. Patient Active Problem List   Diagnosis Date Noted   Prediabetes 11/02/2023   Vertigo, benign paroxysmal, left 09/29/2023   Esophageal stricture 03/11/2023   Allergic asthma, mild intermittent, uncomplicated 01/18/2023   Thoracic stenosis 10/21/2021   Spinal stenosis of lumbar region 10/21/2021   Healthcare maintenance 08/13/2021   Current chronic use of systemic steroids 06/04/2021   BRCA2 positive 11/26/2020   Gynecologic malignancy (HCC) 08/05/2020   DDD (degenerative disc disease), lumbar  07/11/2019   Immunosuppressed status (HCC) 01/13/2019   Dermatomyositis (HCC) 01/09/2019   Chronic right-sided low back pain with right-sided sciatica 01/09/2019   Post herpetic neuralgia 10/05/2018   Benign essential hypertension 05/19/2016   Mixed hyperlipidemia 05/19/2016    ONSET DATE: 11/02/2023 (referral date)   REFERRING DIAG: R42 (ICD-10-CM) - Vertigo  THERAPY DIAG:  Dizziness and giddiness  Unsteadiness on feet  Rationale for Evaluation and Treatment: Rehabilitation  SUBJECTIVE:  SUBJECTIVE STATEMENT: Patient arrived to clinic with son Ray. Requesting to use clinic wc to transport back into gym. Denies falls. Denies dizziness currently, but states last night she felt a little dizzy. Unable to really describe her dizziness.   Pt accompanied by: self - dropped off by son Ray   PERTINENT HISTORY: dysphagia, gynecologic malignancy, dermatomyositis   PAIN:  Are you having pain? Yes: NPRS scale: 6/10 Pain location: Low back Pain description: achy Aggravating factors: bumping up against something  Relieving factors: medication  PRECAUTIONS: Fall and Other: possible swallow precautions - Jan 6th is when patient is testing    PATIENT GOALS: I want to walk better.   OBJECTIVE:  Note: Objective measures were completed at Evaluation unless otherwise noted.  DIAGNOSTIC FINDINGS: No recent imaging past 2021 - imagaing at this time insignificant    VESTIBULAR ASSESSMENT:  VESTIBULAR - OCULAR REFLEX:   Slow VOR: Normal  VOR Cancellation: Normal  Head-Impulse Test: (-) bilaterally     POSITIONAL TESTING:  Right Roll Test: no nystagmus Left Roll Test: no nystagmus Right Sidelying: no nystagmus Left Sidelying: no nystagmus    MOTION SENSITIVITY:    Motion Sensitivity  Quotient Intensity: 0 = none, 1 = Lightheaded, 2 = Mild, 3 = Moderate, 4 = Severe, 5 = Vomiting  Intensity  1. Sitting to supine 0  2. Supine to L side 0  3. Supine to R side 0  4. Supine to sitting 0  5. L Hallpike-Dix- modified sidelying 0  6. Up from L - modified sidelying 0  7. R Hallpike-Dix - modified sidelying 0  8. Up from R - modified sidelying 0  9. Sitting, head  tipped to L knee 0  10. Head up from L  knee 0  11. Sitting, head  tipped to R knee 0  12. Head up from R  knee 0  13. Sitting head turns x5 0  14.Sitting head nods x5 0  15. In stance, 180  turn to L  NT  16. In stance, 180  turn to R NT    OTHOSTATICS:  Vitals:   12/07/23 1112  BP: (!) 156/82  Pulse: 76       Seated     Standing  FUNCTIONAL GAIT: Not captured                                                                                                                               TREATMENT:  Initial Eval only  PATIENT EDUCATION: Education details: POC, goal collaboration, examination findings Person educated: Patient Education method: Explanation Education comprehension: verbalized understanding and needs further education  HOME EXERCISE PROGRAM: To be performed as indicated   GOALS: Goals reviewed with patient? Yes  LONG TERM GOALS: Target date: 12/21/2023 (STG = LTG due to POC length)  Patient will report demonstrate independence with final HEP in order to maintain current gains and continue to progress after physical therapy discharge.  Baseline: To be provided ; not necessary Goal status: MET  2.  Patient will report dizziness less than 2/5 with sit to supine on MSQ to demonstrate reduce motion sensitivity.  Baseline: 2/5 - reporting room spinning feel; 0/5 Goal status: MET  3.  mCTSIB to be assessed / LTG written as indicated  Baseline: To be assessed  Goal status: INITIAL   ASSESSMENT:  CLINICAL IMPRESSION: Patient seen for skilled PT session with emphasis on  re-assessing vestibular system and dc. Patient only reporting dizziness when she puts her head in a dependent position for prolonged periods of time (picking something up, cleaning her tub, etc) and then returning to upright. All other vestibular testing with negative today. Discussed ways to minimize need to put head in dependent position. Patient verbalized understanding and is agreeable to dc at this time.   OBJECTIVE IMPAIRMENTS: decreased balance, dizziness, and pain.   ACTIVITY LIMITATIONS: bending, bed mobility, and reach over head  PARTICIPATION LIMITATIONS: cleaning, laundry, shopping, and community activity  PERSONAL FACTORS: Age and 3+ comorbidities: see above  are also affecting patient's functional outcome.   REHAB POTENTIAL: Fair may be limited by multiple comorbidities   CLINICAL DECISION MAKING: Evolving/moderate complexity  EVALUATION COMPLEXITY: Moderate  PLAN:  PT FREQUENCY: 1x/week  PT DURATION: 3 weeks   PLANNED INTERVENTIONS: 97164- PT Re-evaluation, 97110-Therapeutic exercises, 97530- Therapeutic activity, [id number]- Neuromuscular re-education, 97535- Self Care, 02859- Manual therapy, [id number]- Gait training, [phone]- Canalith repositioning, and Vestibular training  PLAN FOR NEXT SESSION: dc from PT   Delon DELENA Pop, PT Delon DELENA Pop, PT, DPT, CBIS  12/07/2023, 11:36 AM

## 2023-12-15 ENCOUNTER — Telehealth: Payer: Self-pay | Admitting: *Deleted

## 2023-12-15 NOTE — Progress Notes (Signed)
Complex Care Management Care Guide Note  12/15/2023 Name: [name] MRN: [id number] DOB: [birth year]  Haley Johnson is a 78 y.o. year old female who is a primary care patient of Miguel Aschoff, MD and is actively engaged with the care management team. I reached out to [name] by phone today to assist with re-scheduling  with the RN Case Manager.  Follow up plan: Unsuccessful telephone outreach attempt made. A HIPAA compliant phone message was left for the patient providing contact information and requesting a return call.  Gwenevere Ghazi  [facility]  [facility], [facility] Guide  Direct Dial: [phone]  Fax [phone]

## 2023-12-21 NOTE — Progress Notes (Signed)
Complex Care Management Care Guide Note  12/21/2023 Name: Haley Johnson MRN: [id number] DOB: 1946-07-31  Haley Johnson is a 78 y.o. year old female who is a primary care patient of Miguel Aschoff, MD and is actively engaged with the care management team. I reached out to Haley Johnson by phone today to assist with re-scheduling  with the RN Case Manager.  Follow up plan: Telephone appointment with complex care management team member scheduled for:  2/25  Haley Johnson  [facility]  [facility], [facility] Guide  Direct Dial: [phone]  Fax [phone]

## 2023-12-24 DIAGNOSIS — C579 Malignant neoplasm of female genital organ, unspecified: Secondary | ICD-10-CM | POA: Diagnosis not present

## 2023-12-24 DIAGNOSIS — Z5111 Encounter for antineoplastic chemotherapy: Secondary | ICD-10-CM | POA: Diagnosis not present

## 2023-12-29 ENCOUNTER — Telehealth: Payer: Self-pay | Admitting: Physical Therapy

## 2023-12-29 DIAGNOSIS — M3313 Other dermatomyositis without myopathy: Secondary | ICD-10-CM | POA: Diagnosis not present

## 2023-12-29 NOTE — Telephone Encounter (Signed)
 Opened in error. Patient not called. Patient episode closed as has not been seen.  Camella Cave, PT, DPT

## 2023-12-30 DIAGNOSIS — M3313 Other dermatomyositis without myopathy: Secondary | ICD-10-CM | POA: Diagnosis not present

## 2024-01-18 ENCOUNTER — Ambulatory Visit: Payer: Self-pay

## 2024-01-18 NOTE — Patient Outreach (Signed)
 Care Coordination   Follow Up Visit Note   01/18/2024 Name: Haley Johnson MRN: [id number] DOB: 05/29/1946  Haley Johnson is a 78 y.o. year old female who sees Mayford Knife, Dorene Ar, MD for primary care. I spoke with  Haley Johnson by phone today.  What matters to the patients health and wellness today?  Haley Johnson reports experiencing back pain intermittently. She is currently taking ibuprofen and Lyrica for management of her symptoms and has utilized Biofreeze for additional relief. Despite these challenges, she continues to engage in regular physical activity, including walking and completing household chores. Haley Johnson indicated that her eating and sleeping patterns are satisfactory. She is also consuming Ensure, specifically favoring the vanilla flavor, and has expressed interest in obtaining more samples, which I will investigate on her behalf. Additionally, she is adhering to her prescribed medication regimen appropriately.       Goals Addressed             This Visit's Progress    I have pain in my lumbar areaa on the right side that goes down my right leg       Patient Goals/Self Care Activities: -Patient/Caregiver will take medications as prescribed   -Patient/Caregiver will attend all scheduled provider appointments -Patient/Caregiver will call pharmacy for medication refills 3-7 days in advance of running out of medications -Patient/Caregiver will call provider office for new concerns or questions  -Patient/Caregiver will focus on medication adherence by taking medications as prescribed   Discussed importance of adherence to all scheduled medical appointments Advised patient to report to care team affect of pain on daily activities Discussed use of relaxation techniques and/or diversional activities to assist with pain reduction (distraction, imagery, relaxation, massage, acupressure, TENS, heat, and cold application Reviewed with patient  prescribed pharmacological and nonpharmacological pain relief strategies Continue to use Tylenol for pain and rubs Continue with your current regime          SDOH assessments and interventions completed:  No     Care Coordination Interventions:  Yes, provided   Interventions Today    Flowsheet Row Most Recent Value  Chronic Disease   Chronic disease during today's visit Other  [Back pain]  General Interventions   General Interventions Discussed/Reviewed General Interventions Discussed  Pharmacy Interventions   Pharmacy Dicussed/Reviewed Pharmacy Topics Discussed, Medications and their functions  Safety Interventions   Safety Discussed/Reviewed Safety Discussed        Follow up plan: Follow up call scheduled for 02/15/24  130 pm    Encounter Outcome:  Patient Visit Completed   Juanell Fairly RN, BSN, [facility]  [facility], [facility] Health  Care Coordinator Phone: [phone]

## 2024-01-18 NOTE — Patient Instructions (Signed)
 Visit Information  Thank you for taking time to visit with me today. Please don't hesitate to contact me if I can be of assistance to you.   Following are the goals we discussed today:   Goals Addressed             This Visit's Progress    I have pain in my lumbar areaa on the right side that goes down my right leg       Patient Goals/Self Care Activities: -Patient/Caregiver will take medications as prescribed   -Patient/Caregiver will attend all scheduled provider appointments -Patient/Caregiver will call pharmacy for medication refills 3-7 days in advance of running out of medications -Patient/Caregiver will call provider office for new concerns or questions  -Patient/Caregiver will focus on medication adherence by taking medications as prescribed   Discussed importance of adherence to all scheduled medical appointments Advised patient to report to care team affect of pain on daily activities Discussed use of relaxation techniques and/or diversional activities to assist with pain reduction (distraction, imagery, relaxation, massage, acupressure, TENS, heat, and cold application Reviewed with patient prescribed pharmacological and nonpharmacological pain relief strategies Continue to use Tylenol for pain and rubs Continue with your current regime          Our next appointment is by telephone on 02/15/24 at 130 pm  Please call the care guide team at [phone] if you need to cancel or reschedule your appointment.   If you are experiencing a Mental Health or Behavioral Health Crisis or need someone to talk to, please call 1-800-273-TALK (toll free, 24 hour hotline)  Patient verbalizes understanding of instructions and care plan provided today and agrees to view in MyChart. Active MyChart status and patient understanding of how to access instructions and care plan via MyChart confirmed with patient.     Juanell Fairly RN, BSN, [facility] [place]  [facility],  [facility] Health  Care Coordinator Phone: [phone]

## 2024-01-21 DIAGNOSIS — C579 Malignant neoplasm of female genital organ, unspecified: Secondary | ICD-10-CM | POA: Diagnosis not present

## 2024-01-21 DIAGNOSIS — Z5111 Encounter for antineoplastic chemotherapy: Secondary | ICD-10-CM | POA: Diagnosis not present

## 2024-01-26 DIAGNOSIS — M3313 Other dermatomyositis without myopathy: Secondary | ICD-10-CM | POA: Diagnosis not present

## 2024-01-27 DIAGNOSIS — M3313 Other dermatomyositis without myopathy: Secondary | ICD-10-CM | POA: Diagnosis not present

## 2024-02-10 DIAGNOSIS — Z9225 Personal history of immunosupression therapy: Secondary | ICD-10-CM | POA: Diagnosis not present

## 2024-02-10 DIAGNOSIS — R1310 Dysphagia, unspecified: Secondary | ICD-10-CM | POA: Diagnosis not present

## 2024-02-10 DIAGNOSIS — M3313 Other dermatomyositis without myopathy: Secondary | ICD-10-CM | POA: Diagnosis not present

## 2024-02-10 DIAGNOSIS — C579 Malignant neoplasm of female genital organ, unspecified: Secondary | ICD-10-CM | POA: Diagnosis not present

## 2024-02-14 DIAGNOSIS — J45909 Unspecified asthma, uncomplicated: Secondary | ICD-10-CM | POA: Diagnosis not present

## 2024-02-14 DIAGNOSIS — R1310 Dysphagia, unspecified: Secondary | ICD-10-CM | POA: Diagnosis not present

## 2024-02-14 DIAGNOSIS — Z9889 Other specified postprocedural states: Secondary | ICD-10-CM | POA: Diagnosis not present

## 2024-02-14 DIAGNOSIS — R7303 Prediabetes: Secondary | ICD-10-CM | POA: Diagnosis not present

## 2024-02-14 DIAGNOSIS — I10 Essential (primary) hypertension: Secondary | ICD-10-CM | POA: Diagnosis not present

## 2024-02-14 DIAGNOSIS — M3390 Dermatopolymyositis, unspecified, organ involvement unspecified: Secondary | ICD-10-CM | POA: Diagnosis not present

## 2024-02-14 DIAGNOSIS — K2289 Other specified disease of esophagus: Secondary | ICD-10-CM | POA: Diagnosis not present

## 2024-02-14 DIAGNOSIS — Z931 Gastrostomy status: Secondary | ICD-10-CM | POA: Diagnosis not present

## 2024-02-14 DIAGNOSIS — Z7952 Long term (current) use of systemic steroids: Secondary | ICD-10-CM | POA: Diagnosis not present

## 2024-02-14 DIAGNOSIS — Z881 Allergy status to other antibiotic agents status: Secondary | ICD-10-CM | POA: Diagnosis not present

## 2024-02-14 DIAGNOSIS — Z79899 Other long term (current) drug therapy: Secondary | ICD-10-CM | POA: Diagnosis not present

## 2024-02-14 DIAGNOSIS — K449 Diaphragmatic hernia without obstruction or gangrene: Secondary | ICD-10-CM | POA: Diagnosis not present

## 2024-02-14 DIAGNOSIS — Z8543 Personal history of malignant neoplasm of ovary: Secondary | ICD-10-CM | POA: Diagnosis not present

## 2024-02-15 ENCOUNTER — Ambulatory Visit: Payer: Self-pay

## 2024-02-15 NOTE — Patient Outreach (Signed)
 Care Coordination   Follow Up Visit Note   02/15/2024 Name: Haley Johnson MRN: [id number] DOB: 04/10/1946  Haley Johnson is a 78 y.o. year old female who sees Mayford Knife, Dorene Ar, MD for primary care. I spoke with  Haley Johnson by phone today.  What matters to the patients health and wellness today?  I consulted with Haley Johnson, who reported that she underwent an esophagogastroduodenoscopy (EGD) yesterday. She indicated that she is experiencing mild fatigue and significant back pain, which she rated as an eight on a scale of ten. Haley Johnson has been using ibuprofen, duloxetine, and pregabalin to manage her pain, finding some efficacy in these medications. Additionally, she employs heat therapy at night but prefers cold therapy during the day; however, she had not utilized any pain relief methods today but intends to do so.  Furthermore, Haley Johnson expressed a desire for a vanilla flavor for her treatment. I informed her that the only flavor currently available in the office is chocolate. She responded positively, agreeing to try the chocolate flavor, and inquired about the possibility of obtaining some coupons.    Goals Addressed             This Visit's Progress    I have pain in my lumbar area on the right side that goes down my right leg       Patient Goals/Self Care Activities: -Patient/Caregiver will take medications as prescribed   -Patient/Caregiver will attend all scheduled provider appointments -Patient/Caregiver will call pharmacy for medication refills 3-7 days in advance of running out of medications -Patient/Caregiver will call provider office for new concerns or questions  -Patient/Caregiver will focus on medication adherence by taking medications as prescribed   Discussed importance of adherence to all scheduled medical appointments Advised patient to report to care team affect of pain on daily activities Discussed use of relaxation techniques  and/or diversional activities to assist with pain reduction (distraction, imagery, relaxation, massage, acupressure, TENS, heat, and cold application Reviewed with patient prescribed pharmacological and nonpharmacological pain relief strategies Continue to use Tylenol for pain, rubs, Ibuprofen, and Lyrica           SDOH assessments and interventions completed:  No     Care Coordination Interventions:  Yes, provided  Interventions Today    Flowsheet Row Most Recent Value  Chronic Disease   Chronic disease during today's visit Other  [back pain]  General Interventions   General Interventions Discussed/Reviewed General Interventions Discussed, General Interventions Reviewed  Pharmacy Interventions   Pharmacy Dicussed/Reviewed Pharmacy Topics Discussed, Pharmacy Topics Reviewed  Safety Interventions   Safety Discussed/Reviewed Safety Discussed         Follow up plan: Follow up call scheduled for 03/17/24  230 pm    Encounter Outcome:  Patient Visit Completed   Juanell Fairly RN, BSN, [facility]  [facility], [facility] Health  Care Coordinator Phone: [phone]

## 2024-02-15 NOTE — Patient Instructions (Signed)
 Visit Information  Thank you for taking time to visit with me today. Please don't hesitate to contact me if I can be of assistance to you.   Following are the goals we discussed today:   Goals Addressed             This Visit's Progress    I have pain in my lumbar area on the right side that goes down my right leg       Patient Goals/Self Care Activities: -Patient/Caregiver will take medications as prescribed   -Patient/Caregiver will attend all scheduled provider appointments -Patient/Caregiver will call pharmacy for medication refills 3-7 days in advance of running out of medications -Patient/Caregiver will call provider office for new concerns or questions  -Patient/Caregiver will focus on medication adherence by taking medications as prescribed   Discussed importance of adherence to all scheduled medical appointments Advised patient to report to care team affect of pain on daily activities Discussed use of relaxation techniques and/or diversional activities to assist with pain reduction (distraction, imagery, relaxation, massage, acupressure, TENS, heat, and cold application Reviewed with patient prescribed pharmacological and nonpharmacological pain relief strategies Continue to use Tylenol for pain, rubs, Ibuprofen, and Lyrica           Our next appointment is by telephone on 03/17/24 at 230 pm  Please call the care guide team at [phone] if you need to cancel or reschedule your appointment.   If you are experiencing a Mental Health or Behavioral Health Crisis or need someone to talk to, please call 1-800-273-TALK (toll free, 24 hour hotline)  Patient verbalizes understanding of instructions and care plan provided today and agrees to view in MyChart. Active MyChart status and patient understanding of how to access instructions and care plan via MyChart confirmed with patient.     Juanell Fairly RN, BSN, [facility]  [facility], [facility] Health   Care Coordinator Phone: [phone]

## 2024-02-18 DIAGNOSIS — Z5111 Encounter for antineoplastic chemotherapy: Secondary | ICD-10-CM | POA: Diagnosis not present

## 2024-02-18 DIAGNOSIS — C579 Malignant neoplasm of female genital organ, unspecified: Secondary | ICD-10-CM | POA: Diagnosis not present

## 2024-02-23 DIAGNOSIS — M3313 Other dermatomyositis without myopathy: Secondary | ICD-10-CM | POA: Diagnosis not present

## 2024-02-24 DIAGNOSIS — M3313 Other dermatomyositis without myopathy: Secondary | ICD-10-CM | POA: Diagnosis not present

## 2024-03-15 ENCOUNTER — Other Ambulatory Visit: Payer: Self-pay

## 2024-03-15 MED ORDER — AMLODIPINE BESYLATE 5 MG PO TABS: ORAL_TABLET | ORAL | 3 refills | Status: AC

## 2024-03-15 NOTE — Telephone Encounter (Signed)
 Medication sent to pharmacy

## 2024-03-21 ENCOUNTER — Ambulatory Visit: Payer: Self-pay

## 2024-03-21 NOTE — Patient Instructions (Signed)
 Visit Information  Thank you for taking time to visit with me today. Please don't hesitate to contact me if I can be of assistance to you before our next scheduled appointment.  Your next care management appointment is by telephone on 04/20/24  2 pm   Please call the care guide team at [phone] if you need to cancel, schedule, or reschedule an appointment.   Please call 1-800-273-TALK (toll free, 24 hour hotline) if you are experiencing a Mental Health or Behavioral Health Crisis or need someone to talk to.  Augustin Leber RN, BSN, [facility]  [facility], [facility] Health  Care Coordinator Phone: [phone]

## 2024-03-21 NOTE — Patient Outreach (Signed)
 Complex Care Management   Visit Note  03/21/2024  Name:  Haley Johnson MRN: [id number] DOB: 28-Feb-1946  Situation: Referral received for Complex Care Management related to  Lumbar pain, Sciatica  I obtained verbal consent from Patient.  Visit completed with patient  on the phone  Background:   Past Medical History:  Diagnosis Date   Asthma    Cancer (HCC)    GYN CA   Dermatomyositis (HCC) 05/05/2020   Essential hypertension 05/05/2020   GERD (gastroesophageal reflux disease)    GERD without esophagitis 05/05/2020   Hypercholesteremia    Hypertension    Pelvic abscess in female 05/05/2020    Assessment: Patient Reported Symptoms:  Cognitive Cognitive Status: Able to follow simple commands, Alert and oriented to person, place, and time, Insightful and able to interpret abstract concepts      Neurological Neurological Review of Symptoms: No symptoms reported    HEENT HEENT Symptoms Reported: No symptoms reported      Cardiovascular Cardiovascular Symptoms Reported: No symptoms reported    Respiratory Respiratory Symptoms Reported: No symptoms reported    Endocrine Patient reports the following symptoms related to hypoglycemia or hyperglycemia : No symptoms reported    Gastrointestinal Gastrointestinal Symptoms Reported: Constipation Gastrointestinal Conditions: Constipation Gastrointestinal Self-Management Outcome: 3 (uncertain) Gastrointestinal Comment: advised colace aand drinking water     Genitourinary Genitourinary Symptoms Reported: No symptoms reported    Integumentary Integumentary Symptoms Reported: No symptoms reported    Musculoskeletal Musculoskelatal Symptoms Reviewed: Unsteady gait Musculoskeletal Conditions: Back pain Musculoskeletal Self-Management Outcome: 3 (uncertain) Falls in the past year?: No    Psychosocial Psychosocial Symptoms Reported: No symptoms reported     Quality of Family Relationships: supportive Do you feel physically  threatened by others?: No      03/21/2024    3:50 PM  Depression screen PHQ 2/9  Decreased Interest 0  Down, Depressed, Hopeless 1  PHQ - 2 Score 1    There were no vitals filed for this visit.  Medications Reviewed Today     Reviewed by Augustin Leber, RN (Registered Nurse) on 03/21/24 at 1544  Med List Status: <None>   Medication Order Taking? Sig Documenting Provider Last Dose Status Informant  amLODipine  (NORVASC ) 5 MG tablet [id number] Yes TAKE 1 TABLET(5 MG) BY MOUTH DAILY [name], MD Taking Active   cholecalciferol (VITAMIN D3) 25 MCG (1000 UNIT) tablet [id number] Yes Take 1,000 Units by mouth daily. [provider] Taking Active   diclofenac  Sodium (VOLTAREN ) 1 % GEL [id number] Yes Apply 2 g topically 4 (four) times daily. [name], MD Taking Active   DULoxetine (CYMBALTA) 60 MG capsule [id number] Yes Take by mouth. [provider] Taking Active   ferrous sulfate  (FEROSUL) 325 (65 FE) MG tablet [id number] Yes TAKE 1 TABLET(325 MG) BY MOUTH DAILY [name], MD Taking Active   folic acid  (FOLVITE ) 1 MG tablet [id number] Yes TAKE 1 TABLET(1 MG) BY MOUTH DAILY [name], MD Taking Active   ibuprofen  (ADVIL ) 600 MG tablet [id number] Yes Take 1 tablet (600 mg total) by mouth daily as needed for moderate pain. [name], MD Taking Active   Multiple Vitamin (MULTIVITAMIN) tablet [id number] Yes Take 1 tablet by mouth daily. [provider] Taking Active   predniSONE  (DELTASONE ) 5 MG tablet [id number] Yes Take 5 mg by mouth daily with breakfast. [provider] Taking Active   pregabalin  (LYRICA ) 150 MG capsule [id number] Yes Take 150 mg by mouth 2 (two) times daily. [provider] Taking Active Self  rosuvastatin  (CRESTOR ) 10 MG tablet [id number] Yes Take 4 tablets (40 mg total) by mouth daily. TAKE 1 TABLET(40 MG) BY MOUTH DAILY [name], MD Taking Active   zinc  gluconate 50 MG tablet  [id number] Yes Take 50 mg by mouth daily. [provider] Taking Active             Recommendation:   PCP Follow-up  Follow Up Plan:   Telephone follow up appointment with care management team member scheduled for:  04/20/24  2 pm  Augustin Leber RN, BSN, [facility]  [facility], [facility] Health  Care Coordinator Phone: [phone]

## 2024-03-22 DIAGNOSIS — M3313 Other dermatomyositis without myopathy: Secondary | ICD-10-CM | POA: Diagnosis not present

## 2024-03-23 DIAGNOSIS — M3313 Other dermatomyositis without myopathy: Secondary | ICD-10-CM | POA: Diagnosis not present

## 2024-03-24 DIAGNOSIS — M549 Dorsalgia, unspecified: Secondary | ICD-10-CM | POA: Diagnosis not present

## 2024-03-24 DIAGNOSIS — C579 Malignant neoplasm of female genital organ, unspecified: Secondary | ICD-10-CM | POA: Diagnosis not present

## 2024-03-24 DIAGNOSIS — I10 Essential (primary) hypertension: Secondary | ICD-10-CM | POA: Diagnosis not present

## 2024-03-24 DIAGNOSIS — M1990 Unspecified osteoarthritis, unspecified site: Secondary | ICD-10-CM | POA: Diagnosis not present

## 2024-03-24 DIAGNOSIS — Z5111 Encounter for antineoplastic chemotherapy: Secondary | ICD-10-CM | POA: Diagnosis not present

## 2024-03-24 DIAGNOSIS — E119 Type 2 diabetes mellitus without complications: Secondary | ICD-10-CM | POA: Diagnosis not present

## 2024-03-24 DIAGNOSIS — M3313 Other dermatomyositis without myopathy: Secondary | ICD-10-CM | POA: Diagnosis not present

## 2024-03-24 DIAGNOSIS — D696 Thrombocytopenia, unspecified: Secondary | ICD-10-CM | POA: Diagnosis not present

## 2024-03-24 DIAGNOSIS — J45909 Unspecified asthma, uncomplicated: Secondary | ICD-10-CM | POA: Diagnosis not present

## 2024-03-24 DIAGNOSIS — M79606 Pain in leg, unspecified: Secondary | ICD-10-CM | POA: Diagnosis not present

## 2024-03-28 DIAGNOSIS — H40033 Anatomical narrow angle, bilateral: Secondary | ICD-10-CM | POA: Diagnosis not present

## 2024-03-28 DIAGNOSIS — H2513 Age-related nuclear cataract, bilateral: Secondary | ICD-10-CM | POA: Diagnosis not present

## 2024-04-03 ENCOUNTER — Other Ambulatory Visit: Payer: Self-pay | Admitting: Internal Medicine

## 2024-04-03 DIAGNOSIS — G8929 Other chronic pain: Secondary | ICD-10-CM

## 2024-04-03 NOTE — Telephone Encounter (Signed)
 Copied from CRM [id number]. Topic: Clinical - Medication Refill >> Apr 03, 2024  3:49 PM Shamecia H wrote: Medication: pregabalin  (LYRICA ) 150 MG capsule  Has the patient contacted their pharmacy? Yes (Agent: If no, request that the patient contact the pharmacy for the refill. If patient does not wish to contact the pharmacy document the reason why and proceed with request.) (Agent: If yes, when and what did the pharmacy advise?)  This is the patient's preferred pharmacy:  [facility] DRUG [facility]ORE #[id number] Jonette Nestle, [place] - [address] AT [facility] OF [facility] & [address] [place] [address] [place] [address] Phone: [phone] Fax: [phone]  Is this the correct pharmacy for this prescription? Yes If no, delete pharmacy and type the correct one.   Has the prescription been filled recently? No  Is the patient out of the medication? Yes  Has the patient been seen for an appointment in the last year OR does the patient have an upcoming appointment? Yes  Can we respond through MyChart? Yes  Agent: Please be advised that Rx refills may take up to 3 business days. We ask that you follow-up with your pharmacy.

## 2024-04-06 ENCOUNTER — Telehealth: Payer: Self-pay | Admitting: Internal Medicine

## 2024-04-06 DIAGNOSIS — G8929 Other chronic pain: Secondary | ICD-10-CM

## 2024-04-06 NOTE — Telephone Encounter (Signed)
 04/06/24: Patient calling on update due to out of medication needs this filled ASAP she is in pain. [phone]          Copied from CRM [id number]. Topic: Clinical - Medication Refill >> Apr 03, 2024  3:49 PM Shamecia H wrote: Medication: pregabalin  (LYRICA ) 150 MG capsule  Has the patient contacted their pharmacy? Yes (Agent: If no, request that the patient contact the pharmacy for the refill. If patient does not wish to contact the pharmacy document the reason why and proceed with request.) (Agent: If yes, when and what did the pharmacy advise?)  This is the patient's preferred pharmacy:  [facility] DRUG STORE #[id number] Jonette Nestle, [place] - [address] AT [facility] OF [facility] & [address] [address] [place] [address] Phone: [phone] Fax: [phone]  Is this the correct pharmacy for this prescription? Yes If no, delete pharmacy and type the correct one.   Has the prescription been filled recently? No  Is the patient out of the medication? Yes  Has the patient been seen for an appointment in the last year OR does the patient have an upcoming appointment? Yes  Can we respond through MyChart? Yes  Agent: Please be advised that Rx refills may take up to 3 business days. We ask that you follow-up with your pharmacy. >> Apr 06, 2024  3:33 PM Retta Caster wrote: pregabalin  (LYRICA ) 150 MG capsule-Patient calling on update due to out of medication needs this filled ASAP she is in pain. [phone]  Location [facility] DRUG STORE #[id number] Jonette Nestle, [place] - [address] AT [facility] OF [facility] & [address] [place] [place] [address] Phone: [phone] Fax: [phone]

## 2024-04-07 ENCOUNTER — Other Ambulatory Visit: Payer: Self-pay | Admitting: Internal Medicine

## 2024-04-07 NOTE — Telephone Encounter (Signed)
 Refill pending, duplicate  Copied from CRM #[id number]. Topic: Clinical - Prescription Issue >> Apr 07, 2024 10:34 AM Wynona Hedger wrote: Reason for CRM: Patient called for a refill on 5/12 for pregabalin  (LYRICA ) 150 MG capsule and the pharmacy still has not received it. Patient is in a lot of pain.

## 2024-04-10 MED ORDER — PREGABALIN 150 MG PO CAPS: 150.0000 mg | ORAL_CAPSULE | Freq: Two times a day (BID) | ORAL | 1 refills | Status: AC

## 2024-04-20 ENCOUNTER — Other Ambulatory Visit: Payer: Self-pay

## 2024-04-20 NOTE — Patient Outreach (Signed)
 Complex Care Management   Visit Note  04/20/2024  Name:  Haley Johnson MRN: [id number] DOB: 1946-02-01  Situation: Referral received for Complex Care Management related to Lumbar pain Sciatica  I obtained verbal consent from Patient.  Visit completed with patient  on the phone  Background:   Past Medical History:  Diagnosis Date   Asthma    Cancer (HCC)    GYN CA   Dermatomyositis (HCC) 05/05/2020   Essential hypertension 05/05/2020   GERD (gastroesophageal reflux disease)    GERD without esophagitis 05/05/2020   Hypercholesteremia    Hypertension    Pelvic abscess in female 05/05/2020    Assessment: Patient Reported Symptoms:  Cognitive Cognitive Status: Able to follow simple commands, Alert and oriented to person, place, and time, Normal speech and language skills      Neurological Neurological Review of Symptoms: No symptoms reported    HEENT HEENT Symptoms Reported: Change or loss of hearing HEENT Comment: hearing in left ear is not good  She haas been tested    Cardiovascular Cardiovascular Symptoms Reported: No symptoms reported Does patient have uncontrolled Hypertension?: No    Respiratory Respiratory Symptoms Reported: No symptoms reported    Endocrine Patient reports the following symptoms related to hypoglycemia or hyperglycemia : No symptoms reported    Gastrointestinal Gastrointestinal Symptoms Reported: Constipation Gastrointestinal Conditions: Constipation Gastrointestinal Management Strategies: Medication therapy    Genitourinary Genitourinary Symptoms Reported: No symptoms reported    Integumentary Integumentary Symptoms Reported: Night sweats    Musculoskeletal Musculoskelatal Symptoms Reviewed: Difficulty walking Musculoskeletal Conditions: Back pain, Joint pain Musculoskeletal Management Strategies: Medication therapy Falls in the past year?: No    Psychosocial       Quality of Family Relationships: involved, supportive      04/20/2024     2:47 PM  Depression screen PHQ 2/9  Decreased Interest 0  Down, Depressed, Hopeless 0  PHQ - 2 Score 0    There were no vitals filed for this visit.  Medications Reviewed Today     Reviewed by Augustin Leber, RN (Registered Nurse) on 04/20/24 at 1435  Med List Status: <None>   Medication Order Taking? Sig Documenting Provider Last Dose Status Informant  amLODipine  (NORVASC ) 5 MG tablet [id number] Yes TAKE 1 TABLET(5 MG) BY MOUTH DAILY [name], MD Taking Active   cholecalciferol (VITAMIN D3) 25 MCG (1000 UNIT) tablet [id number] Yes Take 1,000 Units by mouth daily. [provider] Taking Active   diclofenac  Sodium (VOLTAREN ) 1 % GEL [id number] No Apply 2 g topically 4 (four) times daily.  Patient not taking: Reported on 04/20/2024   [name], MD Not Taking Active   DULoxetine (CYMBALTA) 60 MG capsule [id number] Yes Take by mouth. [provider] Taking Active   ferrous sulfate  (FEROSUL) 325 (65 FE) MG tablet [id number] Yes TAKE 1 TABLET(325 MG) BY MOUTH DAILY [name], MD Taking Active   folic acid  (FOLVITE ) 1 MG tablet [id number] Yes TAKE 1 TABLET(1 MG) BY MOUTH DAILY [name], MD Taking Active   ibuprofen  (ADVIL ) 600 MG tablet [id number] Yes Take 1 tablet (600 mg total) by mouth daily as needed for moderate pain. [name], MD Taking Active   Multiple Vitamin (MULTIVITAMIN) tablet [id number] Yes Take 1 tablet by mouth daily. [provider] Taking Active   predniSONE  (DELTASONE ) 5 MG tablet [id number] Yes Take 5 mg by mouth daily with breakfast. [provider] Taking Active   pregabalin  (LYRICA ) 150 MG capsule [id number] Yes Take  1 capsule (150 mg total) by mouth 2 (two) times daily. [name], MD Taking Active   rosuvastatin  (CRESTOR ) 10 MG tablet [id number] Yes Take 4 tablets (40 mg total) by mouth daily. TAKE 1 TABLET(40 MG) BY MOUTH DAILY [name], MD Taking Active   zinc   gluconate 50 MG tablet [id number] Yes Take 50 mg by mouth daily. [provider] Taking Active             Recommendation:   PCP Follow-up  Follow Up Plan:   Telephone follow up appointment date/time:  05/18/24  2 pm  Augustin Leber RN, BSN, [facility]  [facility], [facility] Health  Care Coordinator Phone: [phone]

## 2024-04-20 NOTE — Patient Instructions (Signed)
 Visit Information  Thank you for taking time to visit with me today. Please don't hesitate to contact me if I can be of assistance to you before our next scheduled appointment.  Your next care management appointment is scheduled for:  05/18/24 2 pm   Please call the care guide team at [phone] if you need to cancel, schedule, or reschedule an appointment.   Please call 1-800-273-TALK (toll free, 24 hour hotline) if you are experiencing a Mental Health or Behavioral Health Crisis or need someone to talk to.  Augustin Leber RN, BSN, [facility]  [facility], [facility] Health  Care Coordinator Phone: [phone]

## 2024-04-26 DIAGNOSIS — M3313 Other dermatomyositis without myopathy: Secondary | ICD-10-CM | POA: Diagnosis not present

## 2024-04-27 DIAGNOSIS — M3313 Other dermatomyositis without myopathy: Secondary | ICD-10-CM | POA: Diagnosis not present

## 2024-05-02 ENCOUNTER — Ambulatory Visit: Payer: Self-pay | Admitting: *Deleted

## 2024-05-02 NOTE — Telephone Encounter (Signed)
 2nd attempt to call patient. Patient picked up and hung up. Attempted to call right back and patient hung up.

## 2024-05-02 NOTE — Telephone Encounter (Signed)
 Attempted to call patient on number provided- did not connect. Second attempt- left message to call office  Copied from CRM [id number]. Topic: Clinical - Medical Advice >> May 02, 2024 12:22 PM Tisa Forester wrote: Reason for CRM: want to know if [name], MD can put patient on a  Acid reflux medication  have issues with her throat   when eat something have issue and  can feel something at the top her chest patient call back number [phone]

## 2024-05-02 NOTE — Telephone Encounter (Signed)
 3rd attempt to reach patient- no answer on number provided- message to call office left

## 2024-05-03 ENCOUNTER — Telehealth: Payer: Self-pay | Admitting: Internal Medicine

## 2024-05-03 NOTE — Telephone Encounter (Signed)
-----   Message from [name] sent at 05/03/2024 11:13 AM EDT ----- Regarding: appt needed Please invite [name] to schedule appt with one of our doctors regarding her symptoms which she's called the nurse line about.

## 2024-05-03 NOTE — Telephone Encounter (Signed)
 Pt has an appt 6/16 with Dr Esaw Heckler.

## 2024-05-03 NOTE — Telephone Encounter (Signed)
 Please refer to message below.  Patient was contacted via telephone to schedule an appointment per Dr. Broadus Canes.  She agreed to an appointment on this coming Monday 05/08/24 at 9:45 am with Dr. Esaw Heckler.  Will double check with her son to be sure that is okay since he is her transportation.  Patient was instructed to call back and reschedule if time and date does not work for him.

## 2024-05-03 NOTE — Telephone Encounter (Signed)
 Will request she schedule a visit to evaluate symptoms.

## 2024-05-08 ENCOUNTER — Ambulatory Visit: Admitting: Internal Medicine

## 2024-05-08 ENCOUNTER — Encounter: Payer: Self-pay | Admitting: Internal Medicine

## 2024-05-08 VITALS — BP 165/96 | HR 63 | Temp 98.2°F | Wt 154.6 lb

## 2024-05-08 DIAGNOSIS — M25511 Pain in right shoulder: Secondary | ICD-10-CM | POA: Diagnosis not present

## 2024-05-08 DIAGNOSIS — K219 Gastro-esophageal reflux disease without esophagitis: Secondary | ICD-10-CM | POA: Diagnosis not present

## 2024-05-08 DIAGNOSIS — M3313 Other dermatomyositis without myopathy: Secondary | ICD-10-CM

## 2024-05-08 DIAGNOSIS — M25519 Pain in unspecified shoulder: Secondary | ICD-10-CM | POA: Insufficient documentation

## 2024-05-08 DIAGNOSIS — R61 Generalized hyperhidrosis: Secondary | ICD-10-CM | POA: Diagnosis not present

## 2024-05-08 DIAGNOSIS — Z0000 Encounter for general adult medical examination without abnormal findings: Secondary | ICD-10-CM

## 2024-05-08 MED ORDER — DICLOFENAC SODIUM 1 % EX GEL: 2.0000 g | Freq: Four times a day (QID) | CUTANEOUS | 1 refills | Status: AC

## 2024-05-08 MED ORDER — OMEPRAZOLE 20 MG PO CPDR: 20.0000 mg | DELAYED_RELEASE_CAPSULE | Freq: Every day | ORAL | 1 refills | Status: DC

## 2024-05-08 NOTE — Patient Instructions (Signed)
 Ms Wellnitz,   For your reflux, I am prescribing a medication you have taken before called omeprazole . If you start to experience choking on your food or symptoms similar to what you were experiencing earlier this year, I recommend making another appointment with your GI doctor at [facility].   For your shoulder and arm pain, please use the voltaren  gel up to 3 times daily.   For your sweating, I recommend talking to your oncologist about what options may be available for you.   Thanks,  Dr Esaw Heckler

## 2024-05-08 NOTE — Progress Notes (Signed)
 Subjective:  CC: dyspepsia  HPI:  Haley Johnson is a 78 y.o. female with a past medical history stated below and presents today for above. Please see problem based assessment and plan for additional details.  Past Medical History:  Diagnosis Date   Asthma    Cancer (HCC)    GYN CA   Dermatomyositis (HCC) 05/05/2020   Essential hypertension 05/05/2020   GERD (gastroesophageal reflux disease)    GERD without esophagitis 05/05/2020   Hypercholesteremia    Hypertension    Pelvic abscess in female 05/05/2020    Current Outpatient Medications on File Prior to Visit  Medication Sig Dispense Refill   amLODipine  (NORVASC ) 5 MG tablet TAKE 1 TABLET(5 MG) BY MOUTH DAILY 90 tablet 3   cholecalciferol (VITAMIN D3) 25 MCG (1000 UNIT) tablet Take 1,000 Units by mouth daily.     diclofenac  Sodium (VOLTAREN ) 1 % GEL Apply 2 g topically 4 (four) times daily. (Patient not taking: Reported on 04/20/2024) 100 each 1   DULoxetine (CYMBALTA) 60 MG capsule Take by mouth.     ferrous sulfate  (FEROSUL) 325 (65 FE) MG tablet TAKE 1 TABLET(325 MG) BY MOUTH DAILY 90 tablet 3   folic acid  (FOLVITE ) 1 MG tablet TAKE 1 TABLET(1 MG) BY MOUTH DAILY 90 tablet 1   ibuprofen  (ADVIL ) 600 MG tablet Take 1 tablet (600 mg total) by mouth daily as needed for moderate pain. 90 tablet 3   Multiple Vitamin (MULTIVITAMIN) tablet Take 1 tablet by mouth daily.     predniSONE  (DELTASONE ) 5 MG tablet Take 5 mg by mouth daily with breakfast.     pregabalin  (LYRICA ) 150 MG capsule Take 1 capsule (150 mg total) by mouth 2 (two) times daily. 180 capsule 1   rosuvastatin  (CRESTOR ) 10 MG tablet Take 4 tablets (40 mg total) by mouth daily. TAKE 1 TABLET(40 MG) BY MOUTH DAILY 90 tablet 3   zinc  gluconate 50 MG tablet Take 50 mg by mouth daily.     No current facility-administered medications on file prior to visit.   Review of Systems: ROS negative except for as is noted on the assessment and plan.  Objective:   Vitals:    05/08/24 0954 05/08/24 1033  BP: (!) 151/71 (!) 165/96  Pulse: 69 63  Temp: 98.2 F (36.8 C)   TempSrc: Oral   SpO2: 100%   Weight: 154 lb 9.6 oz (70.1 kg)    Physical Exam: Constitutional: well-appearing, in no acute distress Cardiovascular: regular rate and rhythm Pulmonary/Chest: normal work of breathing on room air MSK: Painless full active range of motion of bilateral shoulders  Assessment & Plan:   Gastroesophageal reflux disease Patient is here for a visit regarding dyspepsia. She has a history of GERD and describes symptoms similar to episodes of reflux in the past. She will have throat pain and burning shortly after eating, and particularly when lying down at night. Denies difficulty swallowing. Notably, patient reports having an esophageal dilation and biopsy earlier this year at [facility]. She also reports occasional blackish looking stools. Recent CBC shows hemoglobin stable at 12.8. She also takes iron pills, which may be contributing to her stool discoloration.  - Start omeprazole  20mg  daily  Shoulder pain Patient describes right shoulder pain for approximately the last week. Doing chores around the house has been mildly painful recently. She feel she may have overworked the muscles. Exam shows painless active range of motion of bilateral shoulders. She has used voltaren  gel in the past to good  effect and would like a refill of this prescription.  - Voltaren  gel ordered  Diaphoresis Patient has been sweating more than usual for the past several weeks, ever since the weather has been getting hotter. This has not happened to her in the past and she does not usually get hot flashes. Discussed possible etiologies including vasomotor symptoms related to menopause. As patient has ongoing cancer treatment for gynecological malignancy, I do not feel comfortable prescribing any hormonal treatment for possible vasomotor symptoms. I instructed the patient to speak with her oncologist about  her sweating at her next appointment.   Healthcare maintenance Paperwork for renewal of handicap car placard completed    Patient discussed with Dr. Tyrell Gallo MD [facility] Internal Medicine  PGY-1 Pager: [phone] Date 05/08/2024  Time 10:50 AM

## 2024-05-08 NOTE — Assessment & Plan Note (Signed)
 Patient is here for a visit regarding dyspepsia. She has a history of GERD and describes symptoms similar to episodes of reflux in the past. She will have throat pain and burning shortly after eating, and particularly when lying down at night. Denies difficulty swallowing. Notably, patient reports having an esophageal dilation and biopsy earlier this year at [facility]. She also reports occasional blackish looking stools. Recent CBC shows hemoglobin stable at 12.8. She also takes iron pills, which may be contributing to her stool discoloration.  - Start omeprazole  20mg  daily

## 2024-05-08 NOTE — Assessment & Plan Note (Signed)
 Patient describes right shoulder pain for approximately the last week. Doing chores around the house has been mildly painful recently. She feel she may have overworked the muscles. Exam shows painless active range of motion of bilateral shoulders. She has used voltaren  gel in the past to good effect and would like a refill of this prescription.  - Voltaren  gel ordered

## 2024-05-08 NOTE — Assessment & Plan Note (Signed)
 Patient has been sweating more than usual for the past several weeks, ever since the weather has been getting hotter. This has not happened to her in the past and she does not usually get hot flashes. Discussed possible etiologies including vasomotor symptoms related to menopause. As patient has ongoing cancer treatment for gynecological malignancy, I do not feel comfortable prescribing any hormonal treatment for possible vasomotor symptoms. I instructed the patient to speak with her oncologist about her sweating at her next appointment.

## 2024-05-08 NOTE — Assessment & Plan Note (Signed)
 Paperwork for renewal of handicap car placard completed

## 2024-05-12 ENCOUNTER — Other Ambulatory Visit: Payer: Self-pay | Admitting: Internal Medicine

## 2024-05-12 NOTE — Telephone Encounter (Unsigned)
 Copied from CRM [id number]. Topic: Clinical - Medication Refill >> May 12, 2024  1:00 PM Fredrica W wrote: Medication: folic acid  (FOLVITE ) 1 MG tablet - son called to request states can reach out to mother with any questions or concerns. No info provided not on DPR.   Has the patient contacted their pharmacy? Yes (Agent: If no, request that the patient contact the pharmacy for the refill. If patient does not wish to contact the pharmacy document the reason why and proceed with request.) (Agent: If yes, when and what did the pharmacy advise?) need new Rx  This is the patient's preferred pharmacy:  [facility] DRUG STORE #[id number] Jonette Nestle, [place] - [address] AT [facility] OF [facility] & [address] [place] [address] [place] [address] Phone: [phone] Fax: [phone]  Is this the correct pharmacy for this prescription? Yes If no, delete pharmacy and type the correct one.   Has the prescription been filled recently? No  Is the patient out of the medication? N/A  Has the patient been seen for an appointment in the last year OR does the patient have an upcoming appointment? Yes  Can we respond through MyChart? No  Agent: Please be advised that Rx refills may take up to 3 business days. We ask that you follow-up with your pharmacy.

## 2024-05-15 MED ORDER — FOLIC ACID 1 MG PO TABS: 1.0000 mg | ORAL_TABLET | Freq: Every day | ORAL | 3 refills | Status: AC

## 2024-05-17 DIAGNOSIS — M439 Deforming dorsopathy, unspecified: Secondary | ICD-10-CM | POA: Diagnosis not present

## 2024-05-17 DIAGNOSIS — M3313 Other dermatomyositis without myopathy: Secondary | ICD-10-CM | POA: Diagnosis not present

## 2024-05-17 DIAGNOSIS — C579 Malignant neoplasm of female genital organ, unspecified: Secondary | ICD-10-CM | POA: Diagnosis not present

## 2024-05-17 DIAGNOSIS — M160 Bilateral primary osteoarthritis of hip: Secondary | ICD-10-CM | POA: Diagnosis not present

## 2024-05-17 DIAGNOSIS — C44509 Unspecified malignant neoplasm of skin of other part of trunk: Secondary | ICD-10-CM | POA: Diagnosis not present

## 2024-05-17 DIAGNOSIS — M48061 Spinal stenosis, lumbar region without neurogenic claudication: Secondary | ICD-10-CM | POA: Diagnosis not present

## 2024-05-17 DIAGNOSIS — M4726 Other spondylosis with radiculopathy, lumbar region: Secondary | ICD-10-CM | POA: Diagnosis not present

## 2024-05-17 DIAGNOSIS — R1319 Other dysphagia: Secondary | ICD-10-CM | POA: Diagnosis not present

## 2024-05-17 DIAGNOSIS — M5416 Radiculopathy, lumbar region: Secondary | ICD-10-CM | POA: Diagnosis not present

## 2024-05-17 DIAGNOSIS — Z5111 Encounter for antineoplastic chemotherapy: Secondary | ICD-10-CM | POA: Diagnosis not present

## 2024-05-17 DIAGNOSIS — M4727 Other spondylosis with radiculopathy, lumbosacral region: Secondary | ICD-10-CM | POA: Diagnosis not present

## 2024-05-17 DIAGNOSIS — D696 Thrombocytopenia, unspecified: Secondary | ICD-10-CM | POA: Diagnosis not present

## 2024-05-18 ENCOUNTER — Telehealth: Payer: Self-pay

## 2024-05-18 NOTE — Progress Notes (Signed)
 Internal Medicine Clinic Attending  Case discussed with the resident at the time of the visit.  We reviewed the resident's history and exam and pertinent patient test results.  I agree with the assessment, diagnosis, and plan of care documented in the resident's note.

## 2024-05-24 DIAGNOSIS — M3313 Other dermatomyositis without myopathy: Secondary | ICD-10-CM | POA: Diagnosis not present

## 2024-05-25 DIAGNOSIS — M3313 Other dermatomyositis without myopathy: Secondary | ICD-10-CM | POA: Diagnosis not present

## 2024-05-31 DIAGNOSIS — M47816 Spondylosis without myelopathy or radiculopathy, lumbar region: Secondary | ICD-10-CM | POA: Diagnosis not present

## 2024-05-31 DIAGNOSIS — M48061 Spinal stenosis, lumbar region without neurogenic claudication: Secondary | ICD-10-CM | POA: Diagnosis not present

## 2024-05-31 DIAGNOSIS — M9934 Osseous stenosis of neural canal of sacral region: Secondary | ICD-10-CM | POA: Diagnosis not present

## 2024-05-31 DIAGNOSIS — M5416 Radiculopathy, lumbar region: Secondary | ICD-10-CM | POA: Diagnosis not present

## 2024-05-31 DIAGNOSIS — M9933 Osseous stenosis of neural canal of lumbar region: Secondary | ICD-10-CM | POA: Diagnosis not present

## 2024-06-12 DIAGNOSIS — M3313 Other dermatomyositis without myopathy: Secondary | ICD-10-CM | POA: Diagnosis not present

## 2024-06-12 DIAGNOSIS — R1310 Dysphagia, unspecified: Secondary | ICD-10-CM | POA: Diagnosis not present

## 2024-06-12 DIAGNOSIS — L853 Xerosis cutis: Secondary | ICD-10-CM | POA: Diagnosis not present

## 2024-06-12 DIAGNOSIS — Z9225 Personal history of immunosupression therapy: Secondary | ICD-10-CM | POA: Diagnosis not present

## 2024-06-12 DIAGNOSIS — C579 Malignant neoplasm of female genital organ, unspecified: Secondary | ICD-10-CM | POA: Diagnosis not present

## 2024-06-16 DIAGNOSIS — C579 Malignant neoplasm of female genital organ, unspecified: Secondary | ICD-10-CM | POA: Diagnosis not present

## 2024-06-16 DIAGNOSIS — Z5111 Encounter for antineoplastic chemotherapy: Secondary | ICD-10-CM | POA: Diagnosis not present

## 2024-06-16 DIAGNOSIS — C569 Malignant neoplasm of unspecified ovary: Secondary | ICD-10-CM | POA: Diagnosis not present

## 2024-06-21 DIAGNOSIS — M3313 Other dermatomyositis without myopathy: Secondary | ICD-10-CM | POA: Diagnosis not present

## 2024-06-22 DIAGNOSIS — M3313 Other dermatomyositis without myopathy: Secondary | ICD-10-CM | POA: Diagnosis not present

## 2024-07-04 ENCOUNTER — Telehealth: Payer: Self-pay | Admitting: *Deleted

## 2024-07-04 NOTE — Progress Notes (Unsigned)
 Complex Care Management Care Guide Note  07/04/2024 Name: Haley Johnson MRN: [id number] DOB: May 16, 1946  Haley Johnson is a 78 y.o. year old female who is a primary care patient of Trudy Mliss Dragon, MD and is actively engaged with the care management team. I reached out to [name] by phone today to assist with re-scheduling  with the RN Case Manager.  Follow up plan: Unsuccessful telephone outreach attempt made. A HIPAA compliant phone message was left for the patient providing contact information and requesting a return call.  [name]  [facility]  Value-Based Care Institute, [facility] Guide  Direct Dial: [phone]  Fax [phone]

## 2024-07-10 NOTE — Progress Notes (Signed)
 Complex Care Management Care Guide Note  07/10/2024 Name: Farida Mcreynolds MRN: [id number] DOB: 02/02/1946  Nadege Bennett Engelbrecht is a 78 y.o. year old female who is a primary care patient of Trudy Mliss Dragon, MD and is actively engaged with the care management team. I reached out to [name] by phone today to assist with re-scheduling  with the RN Case Manager.  Follow up plan: Unsuccessful telephone outreach attempt made. No further outreach attempts will be made at this time. We have been unable to contact the patient to reschedule for complex care management services.   [name]  [facility] Health  Value-Based Care Institute, [facility] Guide  Direct Dial: [phone]  Fax [phone]

## 2024-07-14 DIAGNOSIS — C579 Malignant neoplasm of female genital organ, unspecified: Secondary | ICD-10-CM | POA: Diagnosis not present

## 2024-07-14 DIAGNOSIS — C569 Malignant neoplasm of unspecified ovary: Secondary | ICD-10-CM | POA: Diagnosis not present

## 2024-07-19 DIAGNOSIS — M3313 Other dermatomyositis without myopathy: Secondary | ICD-10-CM | POA: Diagnosis not present

## 2024-07-20 DIAGNOSIS — M3313 Other dermatomyositis without myopathy: Secondary | ICD-10-CM | POA: Diagnosis not present

## 2024-08-11 DIAGNOSIS — C579 Malignant neoplasm of female genital organ, unspecified: Secondary | ICD-10-CM | POA: Diagnosis not present

## 2024-08-11 DIAGNOSIS — C762 Malignant neoplasm of abdomen: Secondary | ICD-10-CM | POA: Diagnosis not present

## 2024-08-11 DIAGNOSIS — Z90710 Acquired absence of both cervix and uterus: Secondary | ICD-10-CM | POA: Diagnosis not present

## 2024-08-11 DIAGNOSIS — I10 Essential (primary) hypertension: Secondary | ICD-10-CM | POA: Diagnosis not present

## 2024-08-11 DIAGNOSIS — J45909 Unspecified asthma, uncomplicated: Secondary | ICD-10-CM | POA: Diagnosis not present

## 2024-08-11 DIAGNOSIS — Z90722 Acquired absence of ovaries, bilateral: Secondary | ICD-10-CM | POA: Diagnosis not present

## 2024-08-11 DIAGNOSIS — C672 Malignant neoplasm of lateral wall of bladder: Secondary | ICD-10-CM | POA: Diagnosis not present

## 2024-08-11 DIAGNOSIS — E119 Type 2 diabetes mellitus without complications: Secondary | ICD-10-CM | POA: Diagnosis not present

## 2024-08-11 DIAGNOSIS — C569 Malignant neoplasm of unspecified ovary: Secondary | ICD-10-CM | POA: Diagnosis not present

## 2024-08-16 DIAGNOSIS — M3313 Other dermatomyositis without myopathy: Secondary | ICD-10-CM | POA: Diagnosis not present

## 2024-08-17 DIAGNOSIS — M3313 Other dermatomyositis without myopathy: Secondary | ICD-10-CM | POA: Diagnosis not present

## 2024-08-24 ENCOUNTER — Telehealth: Payer: Self-pay | Admitting: *Deleted

## 2024-08-24 NOTE — Telephone Encounter (Signed)
 RTC to patient's son.  Requesting samples or coupons for Ensure.   Copied from CRM [id number]. Topic: General - Other >> Aug 24, 2024 11:46 AM Haley Johnson wrote: Reason for CRM: Patient son Haley Johnson needs call back from [employer] Social worker due to Request for PT on coupons. [phone]

## 2024-09-11 ENCOUNTER — Other Ambulatory Visit: Payer: Self-pay

## 2024-09-11 MED ORDER — FERROUS SULFATE 325 (65 FE) MG PO TABS: ORAL_TABLET | ORAL | 3 refills | Status: AC

## 2024-09-13 DIAGNOSIS — M3313 Other dermatomyositis without myopathy: Secondary | ICD-10-CM | POA: Diagnosis not present

## 2024-09-14 DIAGNOSIS — M3313 Other dermatomyositis without myopathy: Secondary | ICD-10-CM | POA: Diagnosis not present

## 2024-10-11 DIAGNOSIS — M3313 Other dermatomyositis without myopathy: Secondary | ICD-10-CM | POA: Diagnosis not present

## 2024-10-12 DIAGNOSIS — M3313 Other dermatomyositis without myopathy: Secondary | ICD-10-CM | POA: Diagnosis not present

## 2024-11-30 ENCOUNTER — Other Ambulatory Visit: Payer: Self-pay | Admitting: *Deleted

## 2024-11-30 DIAGNOSIS — E782 Mixed hyperlipidemia: Secondary | ICD-10-CM

## 2024-12-01 MED ORDER — ROSUVASTATIN CALCIUM 40 MG PO TABS: 40.0000 mg | ORAL_TABLET | Freq: Every day | ORAL | 3 refills | Status: AC

## 2024-12-01 MED ORDER — OMEPRAZOLE 20 MG PO CPDR: 20.0000 mg | DELAYED_RELEASE_CAPSULE | Freq: Every day | ORAL | 1 refills | Status: AC
